# Patient Record
Sex: Male | Born: 1935 | Race: White | Hispanic: No | Smoking: Former smoker
Health system: Southern US, Community
[De-identification: ages and names within clinical notes are randomized; demographics above are authoritative.]

## PROBLEM LIST (undated history)

## (undated) DIAGNOSIS — R0602 Shortness of breath: Secondary | ICD-10-CM

## (undated) DIAGNOSIS — C61 Malignant neoplasm of prostate: Secondary | ICD-10-CM

## (undated) DIAGNOSIS — I639 Cerebral infarction, unspecified: Secondary | ICD-10-CM

## (undated) DIAGNOSIS — I10 Essential (primary) hypertension: Secondary | ICD-10-CM

## (undated) DIAGNOSIS — M1990 Unspecified osteoarthritis, unspecified site: Secondary | ICD-10-CM

## (undated) DIAGNOSIS — J189 Pneumonia, unspecified organism: Secondary | ICD-10-CM

## (undated) DIAGNOSIS — K5790 Diverticulosis of intestine, part unspecified, without perforation or abscess without bleeding: Secondary | ICD-10-CM

## (undated) DIAGNOSIS — J449 Chronic obstructive pulmonary disease, unspecified: Secondary | ICD-10-CM

## (undated) DIAGNOSIS — Z8489 Family history of other specified conditions: Secondary | ICD-10-CM

## (undated) DIAGNOSIS — F419 Anxiety disorder, unspecified: Secondary | ICD-10-CM

## (undated) DIAGNOSIS — K219 Gastro-esophageal reflux disease without esophagitis: Secondary | ICD-10-CM

## (undated) DIAGNOSIS — F329 Major depressive disorder, single episode, unspecified: Secondary | ICD-10-CM

## (undated) DIAGNOSIS — F32A Depression, unspecified: Secondary | ICD-10-CM

## (undated) DIAGNOSIS — K922 Gastrointestinal hemorrhage, unspecified: Secondary | ICD-10-CM

## (undated) HISTORY — DX: Major depressive disorder, single episode, unspecified: F32.9

## (undated) HISTORY — PX: CAROTID STENT INSERTION: SHX5766

## (undated) HISTORY — DX: Anxiety disorder, unspecified: F41.9

## (undated) HISTORY — DX: Depression, unspecified: F32.A

## (undated) HISTORY — PX: TOTAL KNEE ARTHROPLASTY: SHX125

## (undated) HISTORY — DX: Gastro-esophageal reflux disease without esophagitis: K21.9

## (undated) HISTORY — DX: Cerebral infarction, unspecified: I63.9

## (undated) HISTORY — DX: Chronic obstructive pulmonary disease, unspecified: J44.9

## (undated) HISTORY — PX: PROSTATE SURGERY: SHX751

---

## 1999-01-09 ENCOUNTER — Ambulatory Visit (HOSPITAL_COMMUNITY): Admission: RE | Admit: 1999-01-09 | Discharge: 1999-01-09 | Payer: Self-pay | Admitting: *Deleted

## 1999-04-14 ENCOUNTER — Ambulatory Visit (HOSPITAL_COMMUNITY): Admission: RE | Admit: 1999-04-14 | Discharge: 1999-04-14 | Payer: Self-pay | Admitting: Urology

## 1999-04-14 ENCOUNTER — Encounter: Payer: Self-pay | Admitting: Urology

## 2000-06-30 ENCOUNTER — Emergency Department (HOSPITAL_COMMUNITY): Admission: EM | Admit: 2000-06-30 | Discharge: 2000-06-30 | Payer: Self-pay | Admitting: Emergency Medicine

## 2001-07-26 HISTORY — PX: SHOULDER SURGERY: SHX246

## 2002-04-09 ENCOUNTER — Encounter (INDEPENDENT_AMBULATORY_CARE_PROVIDER_SITE_OTHER): Payer: Self-pay

## 2002-04-09 ENCOUNTER — Ambulatory Visit (HOSPITAL_COMMUNITY): Admission: RE | Admit: 2002-04-09 | Discharge: 2002-04-09 | Payer: Self-pay | Admitting: Gastroenterology

## 2002-05-09 ENCOUNTER — Ambulatory Visit (HOSPITAL_BASED_OUTPATIENT_CLINIC_OR_DEPARTMENT_OTHER): Admission: RE | Admit: 2002-05-09 | Discharge: 2002-05-09 | Payer: Self-pay | Admitting: Orthopedic Surgery

## 2005-08-10 ENCOUNTER — Ambulatory Visit (HOSPITAL_COMMUNITY): Admission: RE | Admit: 2005-08-10 | Discharge: 2005-08-10 | Payer: Self-pay | Admitting: Family Medicine

## 2006-09-30 ENCOUNTER — Ambulatory Visit: Payer: Self-pay | Admitting: Critical Care Medicine

## 2006-12-14 ENCOUNTER — Ambulatory Visit: Payer: Self-pay | Admitting: Critical Care Medicine

## 2006-12-23 ENCOUNTER — Ambulatory Visit (HOSPITAL_COMMUNITY): Admission: RE | Admit: 2006-12-23 | Discharge: 2006-12-23 | Payer: Self-pay | Admitting: Family Medicine

## 2007-03-28 ENCOUNTER — Ambulatory Visit (HOSPITAL_COMMUNITY): Admission: RE | Admit: 2007-03-28 | Discharge: 2007-03-28 | Payer: Self-pay | Admitting: Family Medicine

## 2007-03-30 ENCOUNTER — Ambulatory Visit (HOSPITAL_COMMUNITY): Admission: RE | Admit: 2007-03-30 | Discharge: 2007-03-30 | Payer: Self-pay | Admitting: Physician Assistant

## 2007-03-30 ENCOUNTER — Emergency Department (HOSPITAL_COMMUNITY): Admission: EM | Admit: 2007-03-30 | Discharge: 2007-03-30 | Payer: Self-pay | Admitting: Emergency Medicine

## 2007-04-05 ENCOUNTER — Ambulatory Visit: Payer: Self-pay | Admitting: Critical Care Medicine

## 2007-04-05 DIAGNOSIS — J439 Emphysema, unspecified: Secondary | ICD-10-CM

## 2007-04-05 DIAGNOSIS — K219 Gastro-esophageal reflux disease without esophagitis: Secondary | ICD-10-CM

## 2007-04-05 DIAGNOSIS — J984 Other disorders of lung: Secondary | ICD-10-CM

## 2007-06-26 ENCOUNTER — Ambulatory Visit (HOSPITAL_COMMUNITY): Admission: RE | Admit: 2007-06-26 | Discharge: 2007-06-26 | Payer: Self-pay | Admitting: Physician Assistant

## 2007-06-26 ENCOUNTER — Encounter: Payer: Self-pay | Admitting: Critical Care Medicine

## 2007-06-29 ENCOUNTER — Telehealth: Payer: Self-pay | Admitting: Critical Care Medicine

## 2007-10-19 ENCOUNTER — Telehealth (INDEPENDENT_AMBULATORY_CARE_PROVIDER_SITE_OTHER): Payer: Self-pay | Admitting: *Deleted

## 2007-10-20 ENCOUNTER — Ambulatory Visit: Payer: Self-pay | Admitting: Critical Care Medicine

## 2007-12-29 ENCOUNTER — Encounter: Payer: Self-pay | Admitting: Critical Care Medicine

## 2008-01-10 ENCOUNTER — Telehealth (INDEPENDENT_AMBULATORY_CARE_PROVIDER_SITE_OTHER): Payer: Self-pay | Admitting: *Deleted

## 2008-01-12 ENCOUNTER — Ambulatory Visit: Payer: Self-pay | Admitting: Critical Care Medicine

## 2008-02-15 ENCOUNTER — Telehealth (INDEPENDENT_AMBULATORY_CARE_PROVIDER_SITE_OTHER): Payer: Self-pay | Admitting: *Deleted

## 2008-04-01 ENCOUNTER — Inpatient Hospital Stay (HOSPITAL_COMMUNITY): Admission: EM | Admit: 2008-04-01 | Discharge: 2008-04-02 | Payer: Self-pay | Admitting: Emergency Medicine

## 2008-04-01 ENCOUNTER — Ambulatory Visit: Payer: Self-pay | Admitting: Internal Medicine

## 2008-04-08 ENCOUNTER — Ambulatory Visit: Payer: Self-pay | Admitting: Critical Care Medicine

## 2008-04-08 ENCOUNTER — Encounter: Payer: Self-pay | Admitting: Critical Care Medicine

## 2008-05-16 ENCOUNTER — Ambulatory Visit: Payer: Self-pay | Admitting: Critical Care Medicine

## 2008-06-17 ENCOUNTER — Inpatient Hospital Stay (HOSPITAL_COMMUNITY): Admission: EM | Admit: 2008-06-17 | Discharge: 2008-06-22 | Payer: Self-pay | Admitting: Emergency Medicine

## 2008-06-17 ENCOUNTER — Ambulatory Visit: Payer: Self-pay | Admitting: Cardiology

## 2008-06-18 ENCOUNTER — Encounter (INDEPENDENT_AMBULATORY_CARE_PROVIDER_SITE_OTHER): Payer: Self-pay | Admitting: Internal Medicine

## 2008-06-18 ENCOUNTER — Ambulatory Visit: Payer: Self-pay | Admitting: Surgery

## 2008-06-19 ENCOUNTER — Encounter (INDEPENDENT_AMBULATORY_CARE_PROVIDER_SITE_OTHER): Payer: Self-pay | Admitting: Internal Medicine

## 2008-06-21 ENCOUNTER — Encounter: Payer: Self-pay | Admitting: *Deleted

## 2008-06-21 ENCOUNTER — Ambulatory Visit (HOSPITAL_COMMUNITY): Admission: RE | Admit: 2008-06-21 | Discharge: 2008-06-21 | Payer: Self-pay | Admitting: *Deleted

## 2008-07-01 ENCOUNTER — Encounter: Payer: Self-pay | Admitting: Interventional Radiology

## 2008-07-10 ENCOUNTER — Telehealth: Payer: Self-pay | Admitting: Critical Care Medicine

## 2008-08-02 ENCOUNTER — Telehealth (INDEPENDENT_AMBULATORY_CARE_PROVIDER_SITE_OTHER): Payer: Self-pay | Admitting: *Deleted

## 2008-08-22 ENCOUNTER — Telehealth (INDEPENDENT_AMBULATORY_CARE_PROVIDER_SITE_OTHER): Payer: Self-pay | Admitting: *Deleted

## 2008-08-27 ENCOUNTER — Inpatient Hospital Stay (HOSPITAL_COMMUNITY): Admission: RE | Admit: 2008-08-27 | Discharge: 2008-08-28 | Payer: Self-pay | Admitting: Interventional Radiology

## 2008-09-01 ENCOUNTER — Ambulatory Visit: Payer: Self-pay | Admitting: Cardiology

## 2008-09-01 ENCOUNTER — Ambulatory Visit: Payer: Self-pay | Admitting: Critical Care Medicine

## 2008-09-01 ENCOUNTER — Inpatient Hospital Stay (HOSPITAL_COMMUNITY): Admission: EM | Admit: 2008-09-01 | Discharge: 2008-09-05 | Payer: Self-pay | Admitting: Emergency Medicine

## 2008-09-04 ENCOUNTER — Encounter (INDEPENDENT_AMBULATORY_CARE_PROVIDER_SITE_OTHER): Payer: Self-pay | Admitting: Internal Medicine

## 2008-09-10 ENCOUNTER — Encounter: Payer: Self-pay | Admitting: Interventional Radiology

## 2008-09-10 ENCOUNTER — Telehealth (INDEPENDENT_AMBULATORY_CARE_PROVIDER_SITE_OTHER): Payer: Self-pay | Admitting: *Deleted

## 2008-09-27 ENCOUNTER — Encounter: Payer: Self-pay | Admitting: Critical Care Medicine

## 2008-10-01 ENCOUNTER — Ambulatory Visit: Payer: Self-pay | Admitting: Critical Care Medicine

## 2008-10-07 ENCOUNTER — Encounter: Payer: Self-pay | Admitting: Critical Care Medicine

## 2008-11-22 ENCOUNTER — Inpatient Hospital Stay (HOSPITAL_COMMUNITY): Admission: EM | Admit: 2008-11-22 | Discharge: 2008-11-25 | Payer: Self-pay | Admitting: Emergency Medicine

## 2008-12-03 ENCOUNTER — Ambulatory Visit: Payer: Self-pay | Admitting: Critical Care Medicine

## 2008-12-06 ENCOUNTER — Ambulatory Visit: Payer: Self-pay | Admitting: Cardiology

## 2008-12-16 ENCOUNTER — Ambulatory Visit (HOSPITAL_COMMUNITY): Admission: RE | Admit: 2008-12-16 | Discharge: 2008-12-16 | Payer: Self-pay | Admitting: Interventional Radiology

## 2008-12-31 ENCOUNTER — Encounter: Payer: Self-pay | Admitting: Critical Care Medicine

## 2009-01-29 ENCOUNTER — Ambulatory Visit: Payer: Self-pay | Admitting: Critical Care Medicine

## 2009-01-30 ENCOUNTER — Ambulatory Visit (HOSPITAL_COMMUNITY): Admission: RE | Admit: 2009-01-30 | Discharge: 2009-01-30 | Payer: Self-pay | Admitting: Family Medicine

## 2009-02-18 ENCOUNTER — Ambulatory Visit (HOSPITAL_COMMUNITY): Admission: RE | Admit: 2009-02-18 | Discharge: 2009-02-18 | Payer: Self-pay | Admitting: Family Medicine

## 2009-03-12 ENCOUNTER — Telehealth: Payer: Self-pay | Admitting: Critical Care Medicine

## 2009-04-29 ENCOUNTER — Ambulatory Visit: Payer: Self-pay | Admitting: Critical Care Medicine

## 2009-10-31 ENCOUNTER — Ambulatory Visit: Payer: Self-pay | Admitting: Critical Care Medicine

## 2009-11-18 ENCOUNTER — Emergency Department (HOSPITAL_COMMUNITY): Admission: EM | Admit: 2009-11-18 | Discharge: 2009-11-19 | Payer: Self-pay | Admitting: Emergency Medicine

## 2009-11-26 IMAGING — XA IR TRANSCATH EMBOLIZATION
1 series · 11 of 24 positions shown · non-contrast
Comparison: Angiogram of 06/21/2008, and MRI of the brain of
06/18/2008.

CLINICAL DATA: Headaches.  Ataxia.  Confusion.  Right internal
carotid artery intracranial aneurysm.

RIGHT COMMON CAROTID ARTERIOGRAM FOLLOWED BY ENDOVASCULAR STENT-
ASSISTED COILING OF RIGHT INTERNAL CAROTID ARTERY PAROPHTHALMIC
REGION ANEURYSM

[Series 1: run · 11 of 262 slices shown]
[im 12/262]
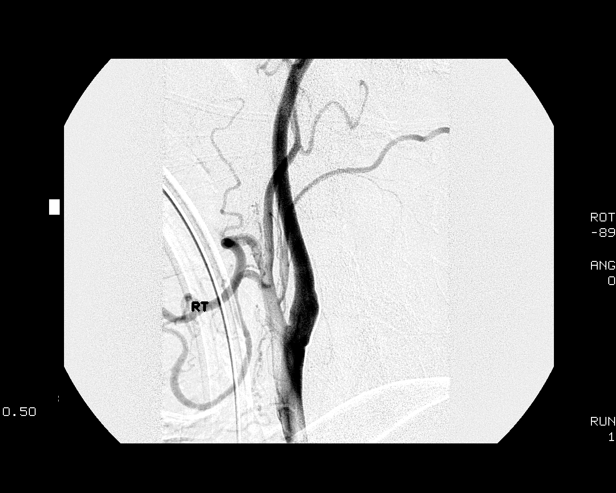
[im 35/262]
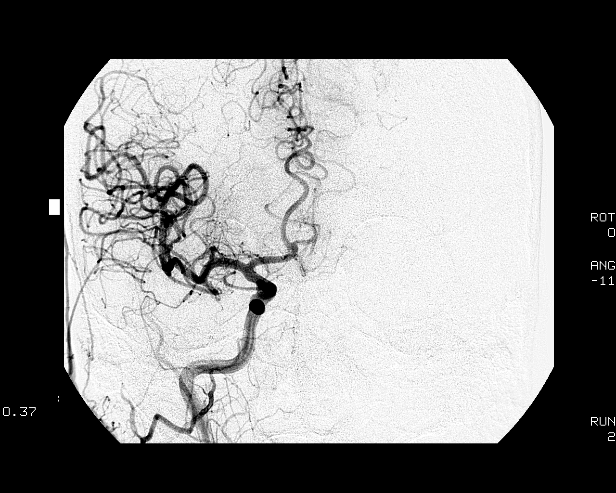
[im 57/262]
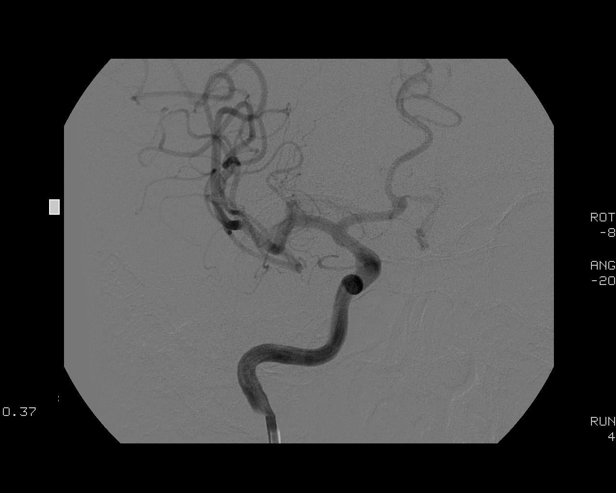
[im 80/262]
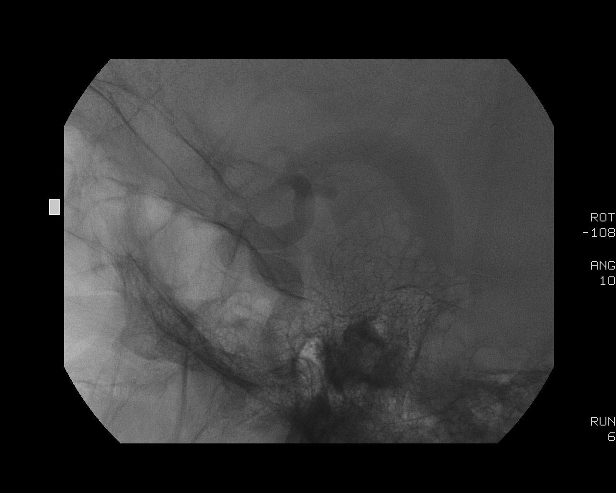
[im 103/262]
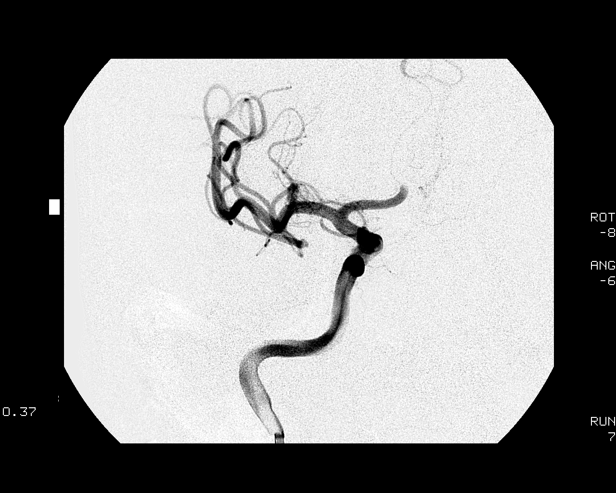
[im 137/262]
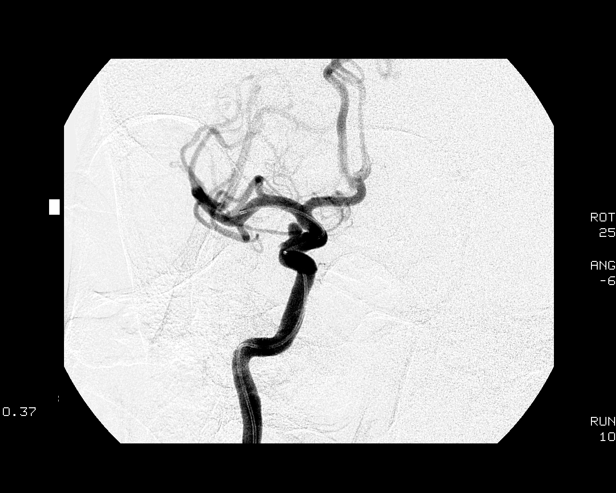
[im 159/262]
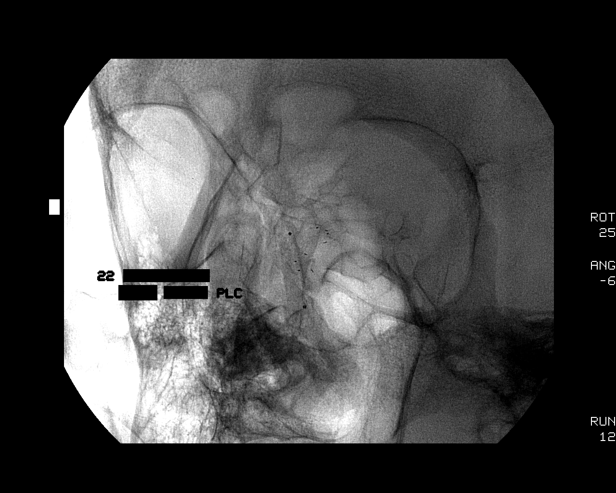
[im 182/262]
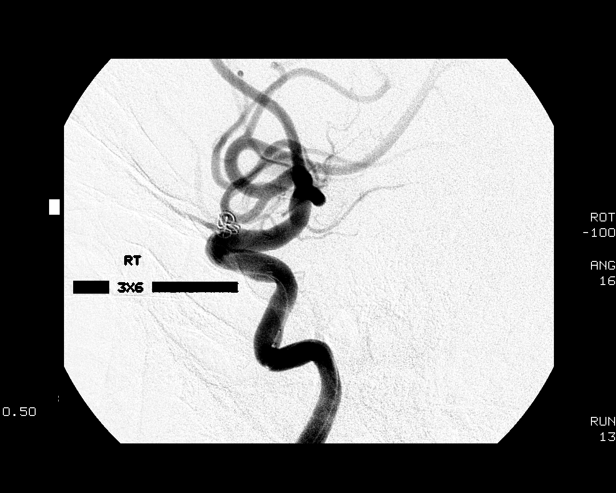
[im 205/262]
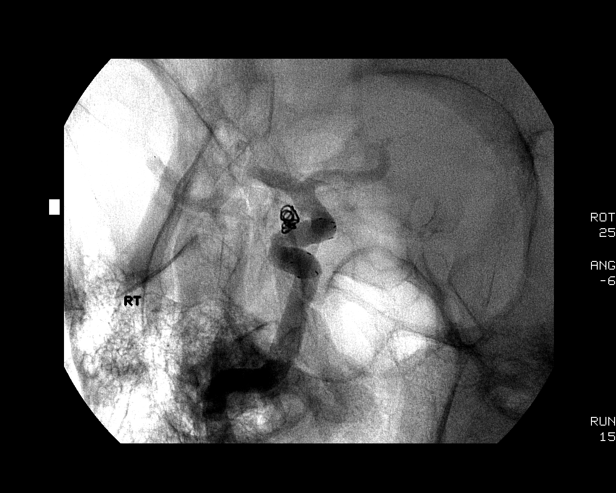
[im 227/262]
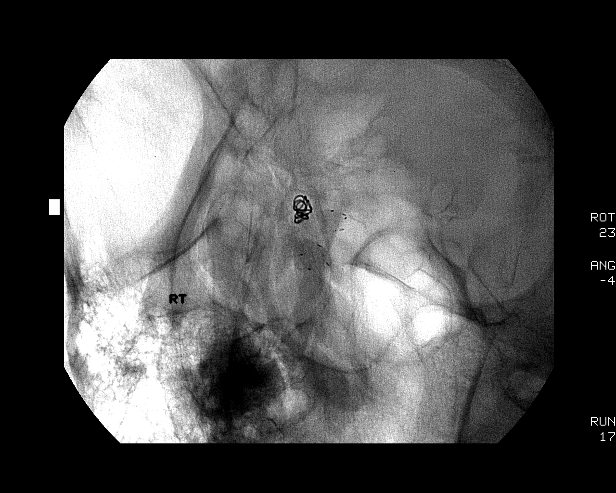
[im 250/262]
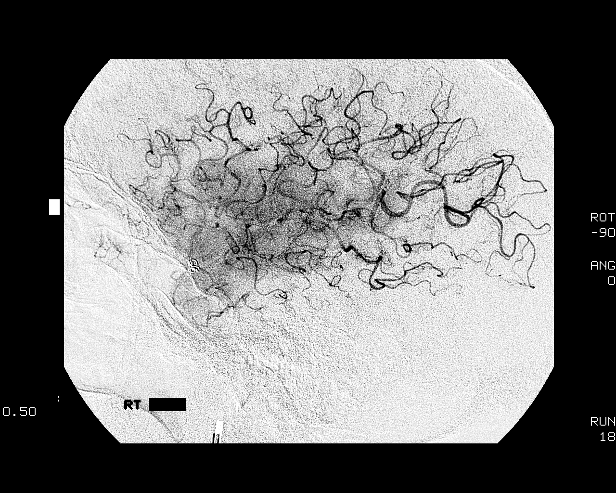

[11 of 24 positions shown; findings below may reference images not displayed]

Following a full explanation of the procedure along with potential
associated complications, an informed witnessed consent was
obtained.  Discussions regarding treatment options of this aneurysm
had been reviewed with the patient and the patient's daughter in
the outpatient consultation.

The patient was put under general anesthesia by the [REDACTED] of [HOSPITAL].

The right groin was prepped and draped in the usual sterile
fashion.  Thereafter using a modified Seldinger technique,
transfemoral access into the right common femoral artery was
obtained without difficulty.  Over a 0.035-inch guidewire, a 5-
French Pinnacle sheath was inserted.  Through this and also over a
0.035-inch guidewire, a 5-French JB1 catheter was advanced to the
aortic arch region and selectively positioned in the right common
carotid artery.  An arteriogram was then perfor[REDACTED]ed over
the right common carotid bifurcation and also intracranially.

The right common carotid bifurcation demonstrated the right
external carotid artery and its major branches to be normal.

The right internal carotid at the bulb showed a minimal plaque.
The vessel was seen to opacify normally to the cranial skull base.

The petrous the cavernous and supraclinoid segments of the right
internal carotid artery were seen to opacify normally.  The right
middle and the right anterior cerebral arteries were also seen to
opacify normally into capillary and venous phases.  Cross
opacification via the anterior communicating artery of the left
anterior cerebral artery distal to the A2 and also the left A1
segment was noted.

Again noted was the wide neck parophthalmic region aneurysm.  A 3-D
rotational arteriogram perfor[REDACTED]ed over the parophthalmic
region aneurysm demonstrated the wide neck nature of the aneurysm.
Also the ophthalmic artery was seen to be separate from the
aneurysm neck.  The aneurysm measured approximately 4.1 mm x
mm.

ENDOVASCULAR STENT-ASSISTED COILING OF THE RIGHT PAROPHTHALMIC
REGION ANEURYSM

The diagnostic 5-French JB1 catheter in the right common carotid
artery was exchanged over a 0.035-inch 300 cm Rosen exchange
guidewire for a 6-French 65 cm neurovascular sheath using biplane
roadmap technique and constant fluoroscopic guidance.  Good
aspiration was obtained from the side port of the neurovascular
sheath.  A gentle contrast injection demonstrated no evidence of
spasm, dissections, or of intraluminal filling defects.  This was
then connected to continuous heparinized saline infusion.

Over the Rosen exchange guidewire, a 6-French 90 cm Brite Tip Envoy
guide catheter was then advanced and positioned just proximal to
the origin of the right internal carotid artery.

The guidewire was removed.  Again good aspiration was obtained from
the hub of the 6-French guide catheter.  A gentle contrast
injection demonstrated no evidence of spasm, dissections, or of
intraluminal filling defects.  Over a 0.035-inch Roadrunner
guidewire, using biplane roadmap technique and constant
fluoroscopic guidance, the 6-French guide catheter was then
advanced into distal right internal carotid artery.  The guidewire
was removed.  Good aspiration was seen from the hub of the 6-French
guide catheter.  A control arteriogram performed through the 6-
French guide catheter demonstrated no change in the extracranial or
the intracranial circulation.

At this time in a coaxial manner and with constant heparinized
saline infusion using roadmap technique and constant fluoroscopic
guidance, a Prowler Select Plus 5 cm two-marker microcatheter which
had been steam shaped was advanced over a 0.014-inch Softip
Transend EX microguidewire to the distal end of the guide catheter.
With the microguidewire leading with a J-tip configuration, the
combination was navigated with the microguidewire leading to the
supraclinoid right ICA.  The right middle cerebral artery was
entered with the microguidewire followed by the microcatheter.  The
tip of the microcatheter was positioned in the mid right M1
segment.  The guidewire was removed.  Good aspiration was obtained
from the hub of the microcatheter.  A control arteriogram performed
through the 6-French guide catheter demonstrated safe positioning
of the tip of the microcatheter.  This was then connected to
continuous heparinized saline infusion.

Through a separate access bore on the double Tuohy-Borst, an
accessory SL 10 two-marker microcatheter which had been steam-
shaped was advanced over a 0.014-inch Softip Transend EX
microguidewire to the distal end of the guide catheter. With the
microguidewire leading with a J-tip configuration to avoid
dissections or inducing spasm, in a coaxial manner and with
constant heparinized saline infusion using biplane roadmap
technique and constant fluoroscopic guidance the combination of the
SL10 microcatheter and the microguidewire were navigated into the
proximal cavernous segment of the right internal carotid artery.
Access into the aneurysm was then attempted with the
microguidewire, without achieving a stable positioning of the
microcatheter tip.  The microcatheter tip was therefore left just
proximal to the neck of the aneurysm.

The guidewire was removed.  Good aspiration was obtained from the
SL10 microcatheter.  A control arteriogram performed through a 6-
French guide catheter demonstrated safe positioning of the tip of
the SL10 microcatheter and also the distal microcatheter.

At this time in a coaxial manner and with constant heparinized
saline infusion, using biplane roadmap technique and constant
fluoroscopic guidance, a 4.5 mm x 22 mm Enterprise stent was
advanced after it had been purged with heparinized saline infusion.
With the stent pusher, the stent was advanced into the distal
Prowler 14 Select Plus 5 cm microcatheter.  The distal and proximal
markers on the stent were then positioned equidistant from the neck
of the aneurysm in the parophthalmic region.

The O-ring on the delivery microcatheter and also on the delivery
stent microguidewire was loosened.  With slight forward gentle
traction with the right hand on the stent pusher, with the left
hand the delivery microcatheter was unsheathed, deploying the
distal end of the stent.  Once there, the stent was maintained for
about 5-10 seconds prior to further unsheathing of the stent and
deploying the remainder of the stent.  Excellent position was
obtained distal and proximal to the neck of the aneurysm.  A
control arteriogram performed through the 6-French guide catheter
demonstrated excellent positioning of the distal and the proximal
markers on the stent.  Good apposition was noted.  Excellent
coverage of the wide neck of the aneurysm was noted.  The delivery
micro guidewire was then retrieved ensuring no movement of the
stent.  None was observed.

Using a 0.014-inch Softip Transend EX microguidewire with a sharp J
configuration, the SL10 microcatheter was then advanced into the
aneurysm and positioned in the middle of the fundus.  The
microguidewire was removed ensuring no forward movements of the
microcatheter tip or the stent.  None was observed.  Good
aspiration was obtained from the hub of the intra aneurysmal
microcatheter.

The first coil utilized was a 2.5 mm x 6.6 cm MicruSphere Cerecyte
coil.  This was advanced in a coaxial manner and with constant
heparinized saline infusion using roadmap technique and constant
fluoroscopic guidance into the distal end of the intra-aneurysmal
microcatheter tip.  The coil was then introduced into the aneurysm
without a stable basket configuration being unsheathed.  This coil
was then removed.  This was then followed by a 3 mm x 6 cm
HydroFrame 10 coil.  This was advanced as described above and
positioned with a nice configuration being obtained within the
aneurysm.  A control arteriogram performed through the 6-French
guide catheter demonstrated safe positioning of the coil loops for
detachment.  This coil was then detached.  A control arteriogram
performed demonstrated near complete obliteration of the aneurysm.
Attempts at a 2 mm x 3 cm HydroSoft helical coil were met with
resistance to advancement of the entire coil. This coil was
therefore retrieved.  A control arteriogram performed through the 6-
French guide catheter demonstrated near complete obliteration with
a small neck remnant noted.  The stent remained in stable position.
No further attempts were made to add additional coils.

The microcatheter was then gently retrieved from the coil mass in
the aneurysm without any movement of the initial basket or of the
stent.  The microcatheter was then retrieved and removed.  A final
control arteriogram performed through the 6-French guide catheter
in the right internal carotid artery demonstrated near complete
obliteration of the small parophthalmic region aneurysm with patent
stent configuration.

No intraluminal filling defects were noted intracranially.

No acute changes were noted in the patient's blood pressure or
neurological status.  The patient's ACT was maintained in the
region of approximately 275-310 seconds.

The 6-French guide catheter and the neurovascular sheath were then
retrieved into the abdominal aorta and exchanged over a J-tip
guidewire for a 7-French Pinnacle sheath.  This was then connected
to continuous heparinized saline infusion.

The patient's general anesthesia was reversed and the patient was
extubated without difficulty.  Upon recovery, the patient's
demonstrated no new neurological signs or symptoms.  The patient
was then transported to the Neuro ICU for overnight IV treatment
with heparin, and also for close monitoring of the patient's blood
pressure and neurological status.

IMPRESSION
1.  Status post endovascular stent-assisted treatment of wide neck
right internal carotid artery intracranial periophthalmic region
aneurysm as described.
2.  The patient's overnight stay was unremarkable.  The patient's
IV heparin was stopped the following day and the right groin sheath
was removed.  Good hemostasis was achieved at the groin site.  The
patient was started on aspirin and Plavix.  Six hours post sheath-
pull the patient was ambulatory and eating independently.  The
patient was then discharged under the care of his daughter with
specific instructions.  The patient will return to the clinic in 2
weeks.

## 2010-01-15 ENCOUNTER — Ambulatory Visit (HOSPITAL_COMMUNITY): Admission: RE | Admit: 2010-01-15 | Discharge: 2010-01-15 | Payer: Self-pay | Admitting: Interventional Radiology

## 2010-03-11 ENCOUNTER — Ambulatory Visit: Payer: Self-pay | Admitting: Critical Care Medicine

## 2010-05-01 IMAGING — US US EXTREM LOW VENOUS*L*
1 series · 14 of 24 positions shown · non-contrast
Comparison: None

CLINICAL DATA: Left leg swelling.



[Series 1: unknown · 14 of 35 slices shown]
[im 1/35]
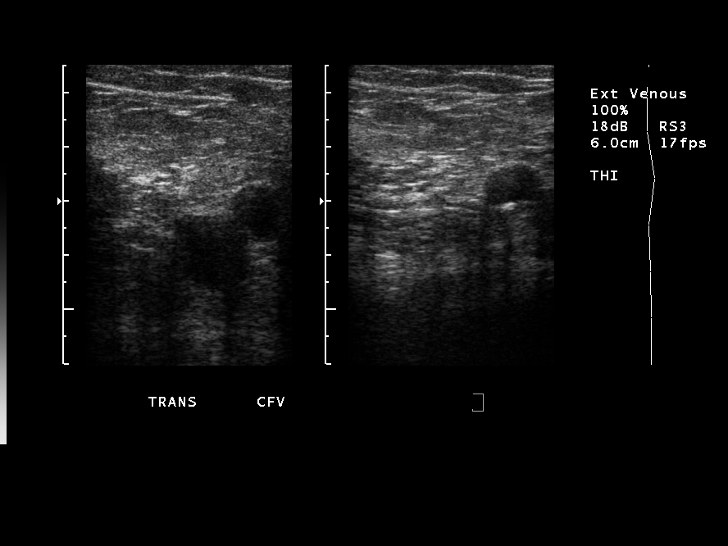
[im 3/35]
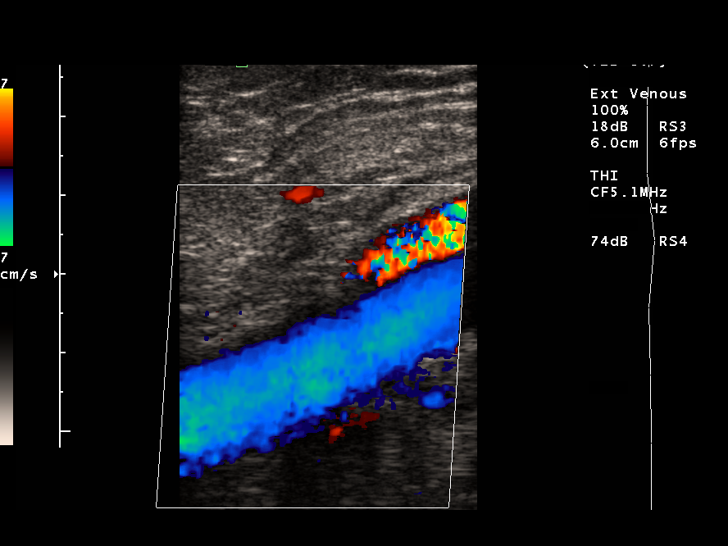
[im 6/35]
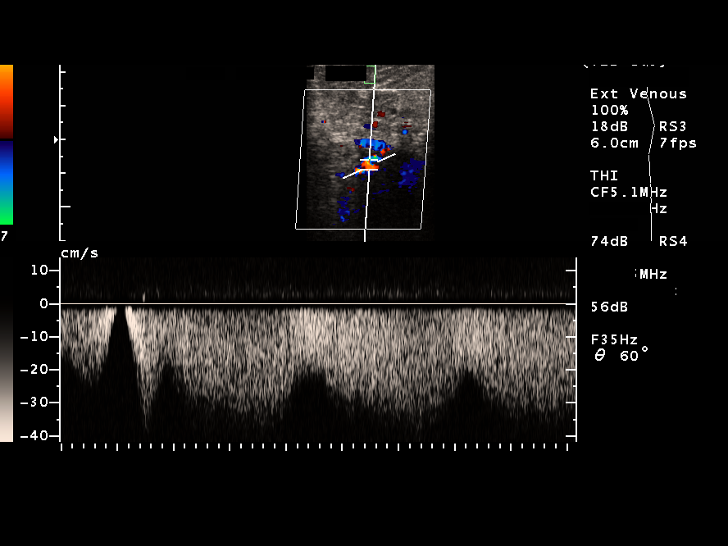
[im 9/35]
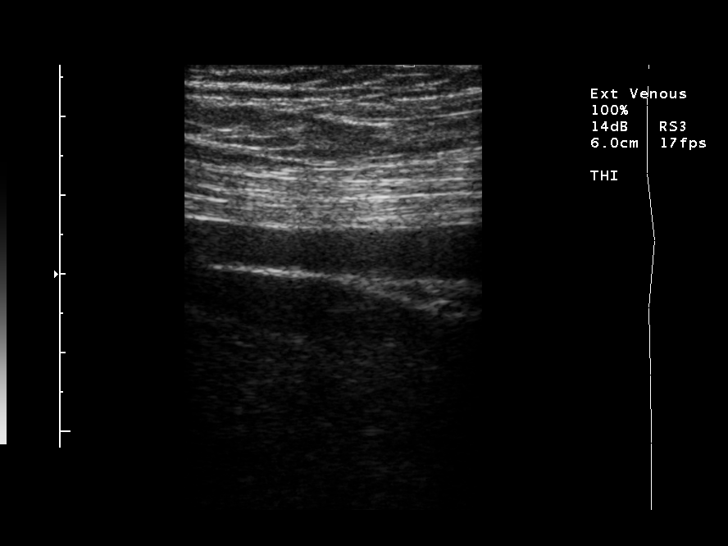
[im 11/35]
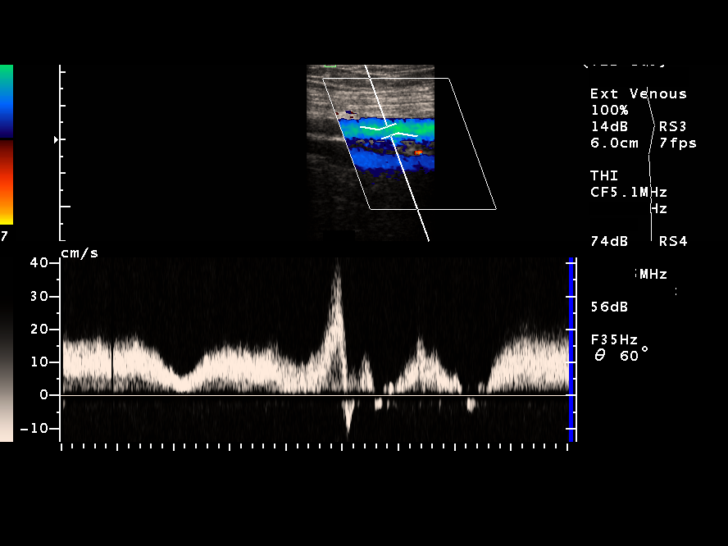
[im 14/35]
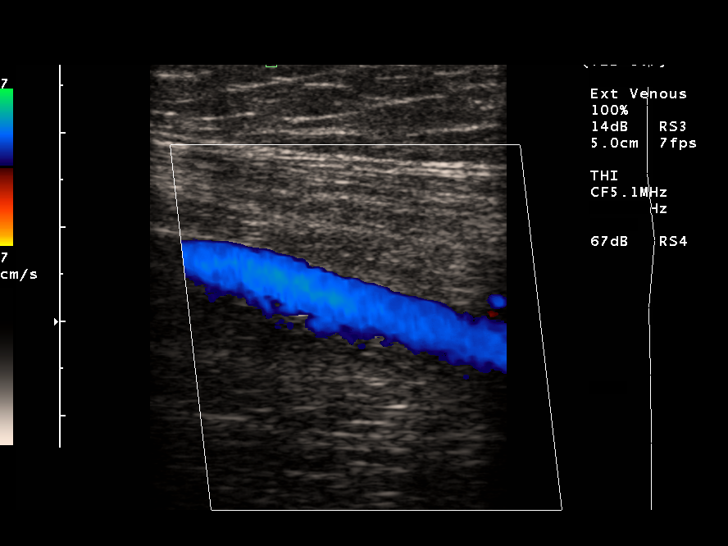
[im 17/35]
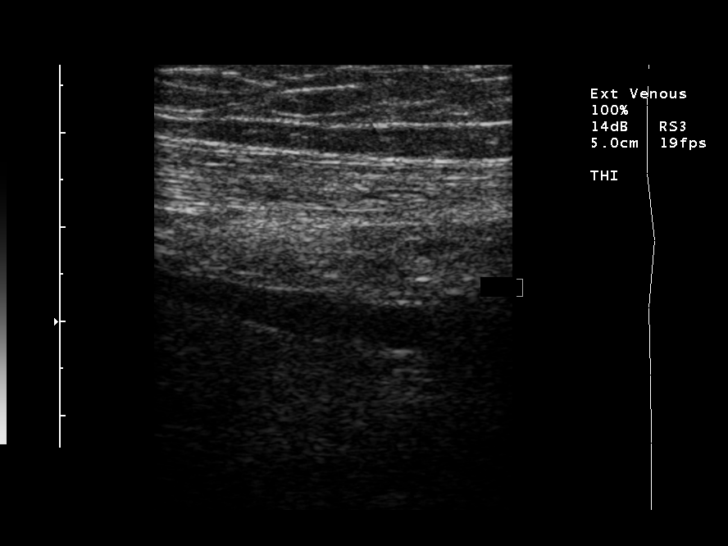
[im 18/35]
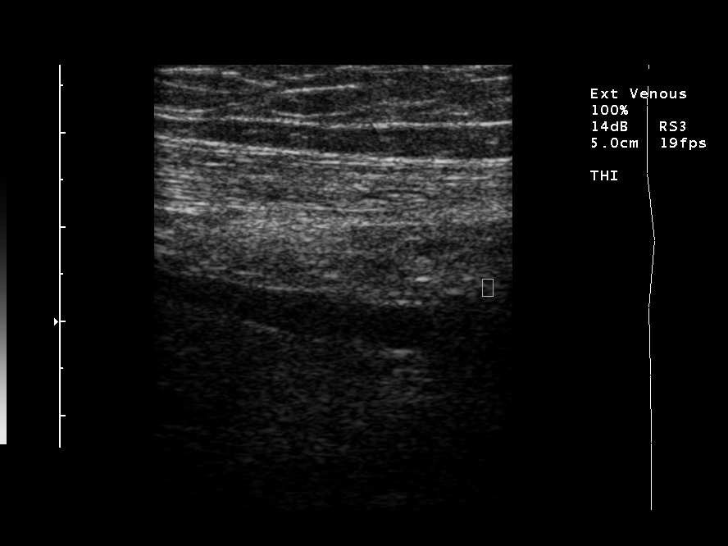
[im 21/35]
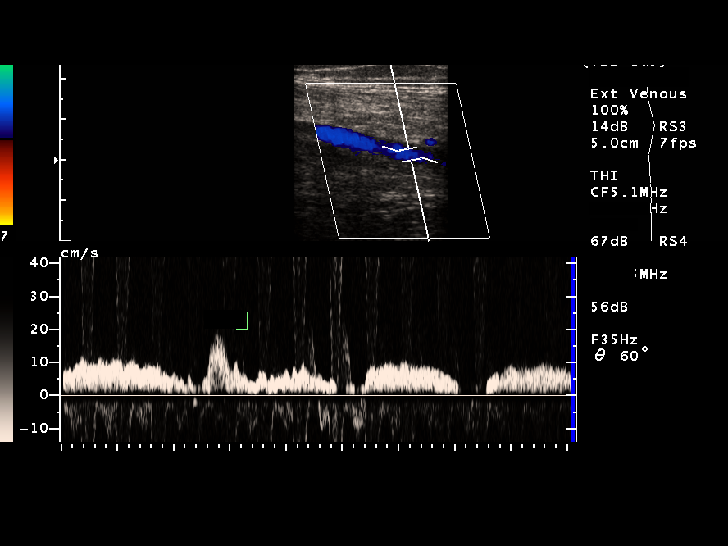
[im 24/35]
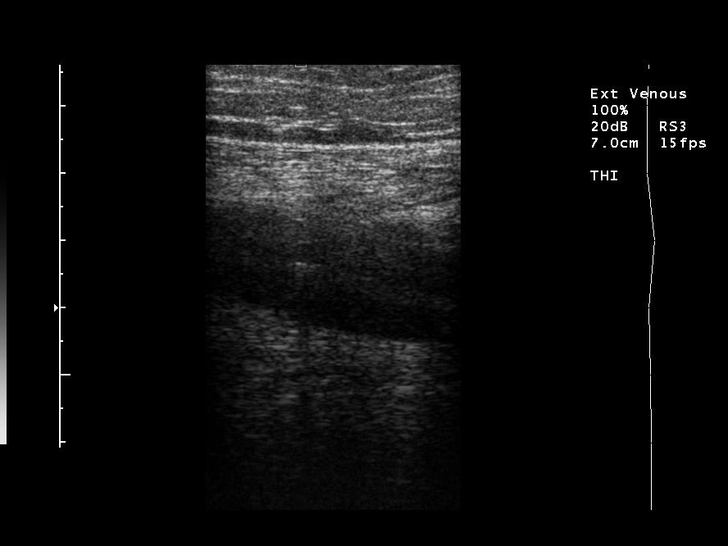
[im 27/35]
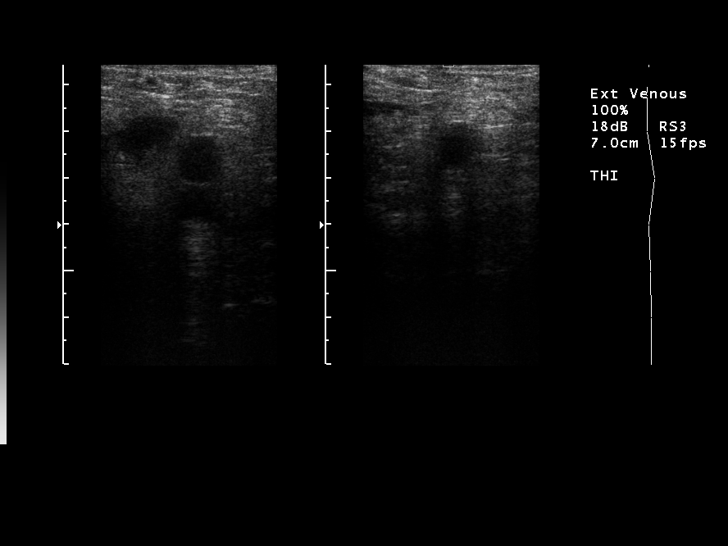
[im 29/35]
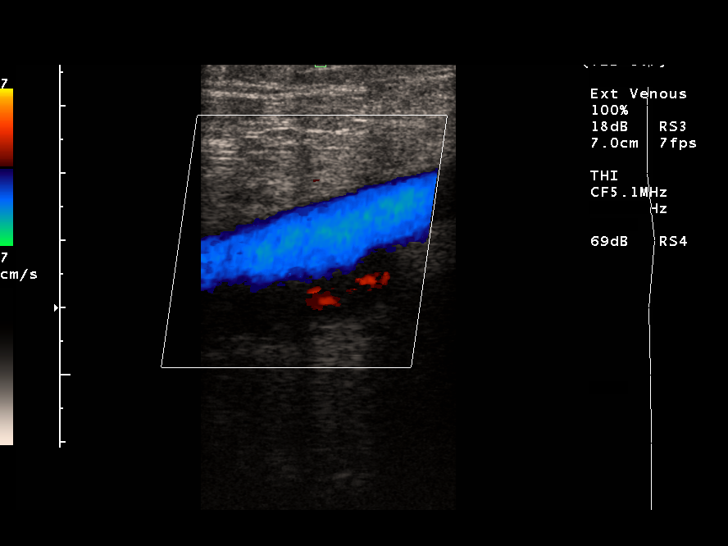
[im 32/35]
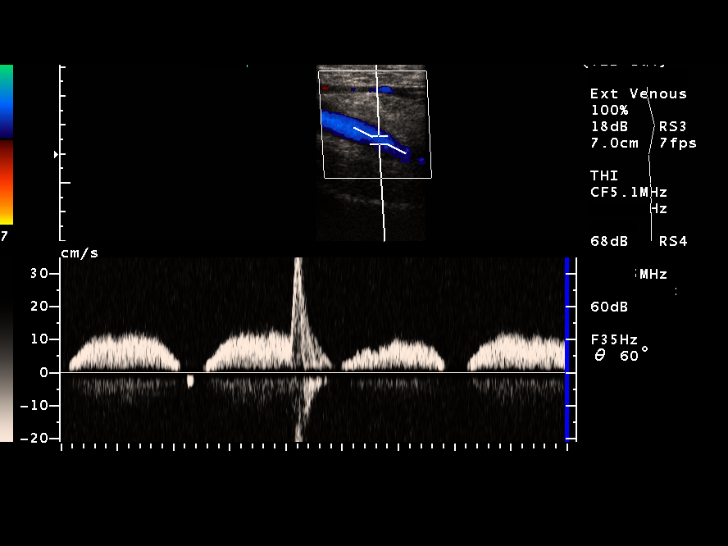
[im 35/35]
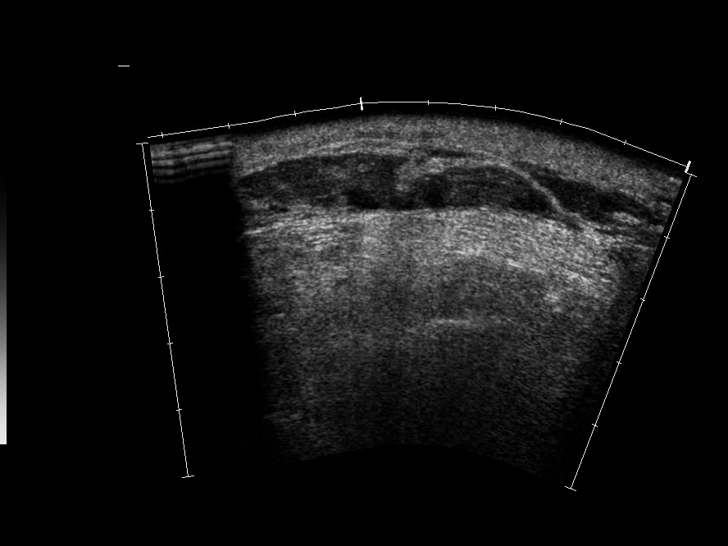

[14 of 24 positions shown; findings below may reference images not displayed]

FINDINGS: From the level of the left common femoral vein to the
left popliteal vein, there is adequate color flow, compression and
augmentation.  Adequate color flow proximal left calf veins.  Edema
left lower calf.
IMPRESSION: No evidence of left lower extremity deep venous thrombosis.

Edema subcutaneous region left lower calf.

## 2010-07-02 ENCOUNTER — Ambulatory Visit: Payer: Self-pay | Admitting: Critical Care Medicine

## 2010-07-26 DIAGNOSIS — I639 Cerebral infarction, unspecified: Secondary | ICD-10-CM

## 2010-07-26 HISTORY — DX: Cerebral infarction, unspecified: I63.9

## 2010-08-16 ENCOUNTER — Encounter: Payer: Self-pay | Admitting: Interventional Radiology

## 2010-08-16 ENCOUNTER — Encounter: Payer: Self-pay | Admitting: Family Medicine

## 2010-08-17 ENCOUNTER — Encounter: Payer: Self-pay | Admitting: Family Medicine

## 2010-08-17 ENCOUNTER — Encounter: Payer: Self-pay | Admitting: Interventional Radiology

## 2010-08-25 NOTE — Assessment & Plan Note (Signed)
Summary: Pulmonary OV   Primary Provider/Referring Provider:  Dr. Rudi Heap in [place]  CC:  4 month follow up.  Pt states overall breathing is unchanged from last OV.  He does states the humidity does make it harder to breath.  Has a prod cough with light green mucus and some wheezing mainly at night.  Denies tightness in chest.  Pt states he stopped taking the advair and restarted symbicort approx 1 month ago.  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  In addition, there are chronic granulomatous changes in both apical sections of both lungs.  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  Cultures have not been obtained.  This is an empiric  diagnosis.   Follow-up CT scan was obtained in December 2008 and when compared to September 2008 many of the areas had regressed.     October 31, 2009 2:10 PM The pt is doing well unless exerts self.  walkd downtown some,  Not much mucous.  Occ spells of mucous is noted.  No chest pain.  No f/c/s. Pt denies any significant sore throat, nasal congestion or excess secretions, fever, chills, sweats, unintended weight loss, pleurtic or exertional chest pain, orthopnea PND, or leg swelling Pt denies any increase in rescue therapy over baseline, denies waking up needing it or having any early am or nocturnal exacerbations of coughing/wheezing/or dyspnea.   March 11, 2010 2:16 PM copd f/u. The pt notes  since april about the same.  The pt notes some cough,  mucus is pale green.   No nasal congestion.  Dyspnea is the same.   Pt denies any significant sore throat, nasal congestion or excess secretions, fever, chills, sweats, unintended weight loss, pleurtic or exertional chest pain, orthopnea PND, or leg swelling Pt denies any increase  in rescue therapy over baseline, denies waking up needing it or having any early am or nocturnal exacerbations of coughing/wheezing/or dyspnea. The pt has switched back to symbicort from advair, but is only taking one puff two times a day of 160stn.    Preventive Screening-Counseling & Management  Alcohol-Tobacco     Smoking Status: quit > 6 months     Year Quit: 2004     Pack years: 57  Current Medications (verified): 1)  Venlafaxine Hcl 150 Mg Xr24h-Cap (Venlafaxine Hcl) .... Two Times A Day 2)  Symbicort 160-4.5 Mcg/act Aero (Budesonide-Formoterol Fumarate) .... Inhale 1 Puff Two Times A Day 3)  Prilosec 20 Mg  Cpdr (Omeprazole) .Marland Kitchen.. 1 By Mouth Daily 4)  Mirtazapine 30 Mg Tabs (Mirtazapine) .... At Bedtime 5)  Aspirin 81 Mg Tabs (Aspirin) .Marland Kitchen.. 1 By Mouth Daily 6)  Zoloft 100 Mg Tabs (Sertraline Hcl) .... Once Daily 7)  Stool Softner .... Once Daily 8)  Vitamin D3 10000 Unit Caps (Cholecalciferol) .... Take 5 Weekly 9)  Iron 325 (65 Fe) Mg Tabs (Ferrous Sulfate) .... Once Daily 10)  Ventolin Hfa 108 (90 Base) Mcg/act  Aers (Albuterol Sulfate) .Marland Kitchen.. 1-2 Puffs Every 4-6 Hours As Needed 11)  Alprazolam 0.5 Mg Tabs (Alprazolam) .... As Needed  Allergies (verified): No Known Drug Allergies  Past History:  Past medical, surgical, family and social histories (including risk factors) reviewed, and no changes noted (except as noted below).  Past Medical History: Reviewed history  from 04/08/2008 and no changes required. COPD GERD  Past Surgical History: Reviewed history from 01/29/2009 and no changes required. R TKR   Family History: Reviewed history from 10/20/2007 and no changes required. non contrib Family History COPD brother died recently 12-25-07  Social History: Reviewed history from 10/20/2007 and no changes required. Patient states former smoker.  retired  Review of Systems       The patient complains of shortness of breath with activity and productive cough.  The  patient denies shortness of breath at rest, non-productive cough, coughing up blood, chest pain, irregular heartbeats, acid heartburn, indigestion, loss of appetite, weight change, abdominal pain, difficulty swallowing, sore throat, tooth/dental problems, headaches, nasal congestion/difficulty breathing through nose, sneezing, itching, ear ache, anxiety, depression, hand/feet swelling, joint stiffness or pain, rash, change in color of mucus, and fever.    Vital Signs:  Patient profile:   77 year old male Height:      73 inches Weight:      203 pounds BMI:     26.88 O2 Sat:      92 % on Room air Temp:     98.5 degrees F oral Pulse rate:   94 / minute BP sitting:   152 / 86  (left arm) Cuff size:   regular  Vitals Entered By: Gweneth Dimitri RN (March 11, 2010 2:02 PM)  O2 Flow:  Room air CC: 4 month follow up.  Pt states overall breathing is unchanged from last OV.  He does states the humidity does make it harder to breath.  Has a prod cough with light green mucus and some wheezing mainly at night.  Denies tightness in chest.  Pt states he stopped taking the advair and restarted symbicort approx 1 month ago.  Symbicort seems to be helping better than the advair.  Does patient need assistance? Functional Status Social activities Comments Medications reviewed with patient Daytime contact number verified with patient. Gweneth Dimitri RN  March 11, 2010 2:02 PM    Physical Exam  Additional Exam:  Gen: Pleasant, well nourished, in no distress ENT: no lesions, no postnasal drip Neck: No JVD, no TMG, no carotid bruits Lungs: no use of accessory muscles, no dullness to percussion, distant BS,  Cardiovascular: RRR, heart sounds normal, no murmurs or gallops, no peripheral edema Musculoskeletal: No deformities, no cyanosis or clubbing     Impression & Recommendations:  Problem # 1:  COPD (ICD-496) Assessment Unchanged  stable copd with AB and emphysema component plan increase  symbicort to two puff two times a day  pt demonstrated 100% efficacy of hfa technique   Medications Added to Medication List This Visit: 1)  Symbicort 160-4.5 Mcg/act Aero (Budesonide-formoterol fumarate) .... Inhale 1 puff two times a day 2)  Symbicort 160-4.5 Mcg/act Aero (Budesonide-formoterol fumarate) .... Inhale 2  puff two times a day  Complete Medication List: 1)  Venlafaxine Hcl 150 Mg Xr24h-cap (Venlafaxine hcl) .... Two times a day 2)  Symbicort 160-4.5 Mcg/act Aero (Budesonide-formoterol fumarate) .... Inhale 2  puff two times a day 3)  Prilosec 20 Mg Cpdr (Omeprazole) .Marland Kitchen.. 1 by mouth daily 4)  Mirtazapine 30 Mg Tabs (Mirtazapine) .... At bedtime 5)  Aspirin 81 Mg Tabs (Aspirin) .Marland Kitchen.. 1 by mouth daily 6)  Zoloft 100 Mg Tabs (Sertraline hcl) .... Once daily 7)  Stool Softner  .... Once daily 8)  Vitamin D3 10000 Unit Caps (Cholecalciferol) .... Take 5 weekly 9)  Iron 325 (65 Fe) Mg Tabs (Ferrous sulfate) .... Once  daily 10)  Ventolin Hfa 108 (90 Base) Mcg/act Aers (Albuterol sulfate) .Marland Kitchen.. 1-2 puffs every 4-6 hours as needed 11)  Alprazolam 0.5 Mg Tabs (Alprazolam) .... As needed  Other Orders: Est. Patient Level III (16109)  Patient Instructions: 1)  Increase symbicort to two puff twice daily 2)  Return 4 months or sooner if necessary  Appended Document: Pulmonary OV fax don moore

## 2010-08-25 NOTE — Assessment & Plan Note (Signed)
Summary: Pulmonary OV   Primary Provider/Referring Provider:  Dr. Rudi Heap in [place]  CC:  6 month COPD follow up.  States breathing is doing "good" overall.  States he does have SOB with activity and wheezing at night.  States he does have "a little cough and " -prod with clear mucus.  Denies chest tightness.  Requesting rx for ventolin and advair-62month and print for [facility]..  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  In addition, there are chronic granulomatous changes in both apical sections of both lungs.  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  Cultures have not been obtained.  This is an empiric  diagnosis.   Follow-up CT scan was obtained in December 2008 and when compared to September 2008 many of the areas had regressed.        October 01, 2008 11:58 AM no changes at last ov Pt was in hosp in [place] and now is better.  Had PNA again.  Now no cough. Pt denies any significant sore throat, nasal congestion or excess secretions, fever, chills, sweats, unintended weight loss, pleurtic or exertional chest pain, orthopnea PND, or leg swelling Pt denies any increase in rescue therapy over baseline, denies waking up needing it or having any early am or nocturnal exacerbations of coughing/wheezing/or dyspnea. Dec 03, 2008 3:59 PM No change in medications at last ov. The pt was rehospitalized 4/30- 5/2 for RLL PNA.  Now improved after 7 days of avelox.  Had prior episodes PNA fall 09 and winter 10. PFTS today reviewed show stable obstructive lung disease. Pt ?s use of advair with recurrent pna. Pt denies any significant sore throat, nasal congestion or excess secretions, fever, chills, sweats, unintended weight loss, pleurtic or exertional chest pain, orthopnea PND, or leg swelling  January 29, 2009  10:20 AM Now noting more dyspnea with humidity.  Noting more cough and mucous is clear to white.  No chest pain is noted.  No wheezing.  Noting some pain in LLE.  Notes more wheezing. Denies fever. Pt denies any significant sore throat, nasal congestion or, fever, chills, sweats, unintended weight loss, pleurtic or exertional chest pain, orthopnea PND, or leg swelling  April 29, 2009 12:14 PM Doing ok,  not much cough now,  no wheeze,  some mucous and is clear.  No chest pain.  No wheezing.  No fever.   spiriva did not help , likes advair better Pt denies any significant sore throat, nasal congestion or excess secretions, fever, chills, sweats, unintended weight loss, pleurtic or exertional chest pain, orthopnea PND, or leg swelling October 31, 2009 2:10 PM The pt is doing well unless exerts self.  walkd downtown some,  Not much mucous.  Occ spells of mucous is noted.  No chest pain.  No f/c/s. Pt denies any significant sore throat, nasal congestion or excess secretions, fever, chills, sweats, unintended weight loss, pleurtic or exertional chest pain, orthopnea PND, or leg swelling Pt denies any increase in rescue therapy over baseline, denies waking up needing it or having any early am or nocturnal exacerbations of coughing/wheezing/or dyspnea.   Preventive Screening-Counseling & Management  Alcohol-Tobacco     Smoking Status: quit > 6 months  Current Medications (verified): 1)  Venlafaxine Hcl 150 Mg Xr24h-Cap (Venlafaxine Hcl) .... Two Times A Day 2)  Advair Diskus 250-50 Mcg/dose Aepb (Fluticasone-Salmeterol) .... One Puff Two Times A Day 3)  Prilosec 20 Mg  Cpdr (Omeprazole) .Marland Kitchen.. 1 By Mouth Daily 4)  Mirtazapine 30 Mg Tabs (Mirtazapine) .... At Bedtime 5)  Aspirin 81 Mg Tabs (Aspirin) .Marland Kitchen.. 1 By Mouth Daily 6)  Zoloft 100 Mg Tabs (Sertraline Hcl) .... Once Daily 7)  Stool Softner .... Once Daily 8)  Vitamin D3 10000 Unit Caps (Cholecalciferol) .... Take 5 Weekly 9)  Iron 325 (65 Fe)  Mg Tabs (Ferrous Sulfate) .... Once Daily 10)  Ventolin Hfa 108 (90 Base) Mcg/act  Aers (Albuterol Sulfate) .Marland Kitchen.. 1-2 Puffs Every 4-6 Hours As Needed 11)  Alprazolam 0.5 Mg Tabs (Alprazolam) .... As Needed 12)  Abilify 2 Mg Tabs (Aripiprazole) .... Once Daily  Allergies (verified): No Known Drug Allergies  Past History:  Past medical, surgical, family and social histories (including risk factors) reviewed, and no changes noted (except as noted below).  Past Medical History: Reviewed history from 04/08/2008 and no changes required. COPD GERD  Past Surgical History: Reviewed history from 01/29/2009 and no changes required. R TKR   Family History: Reviewed history from 10/20/2007 and no changes required. non contrib Family History COPD brother died recently Dec 31, 2007  Social History: Reviewed history from 10/20/2007 and no changes required. Patient states former smoker.  retired  Review of Systems       The patient complains of shortness of breath with activity.  The patient denies shortness of breath at rest, productive cough, non-productive cough, coughing up blood, chest pain, irregular heartbeats, acid heartburn, indigestion, loss of appetite, weight change, abdominal pain, difficulty swallowing, sore throat, tooth/dental problems, headaches, nasal congestion/difficulty breathing through nose, sneezing, itching, ear ache, anxiety, depression, hand/feet swelling, joint stiffness or pain, rash, change in color of mucus, and fever.    Vital Signs:  Patient profile:   77 year old male Height:      73 inches Weight:      200.25 pounds BMI:     26.52 O2 Sat:      93 % on Room air Temp:     98.0 degrees F oral Pulse rate:   91 / minute BP sitting:   124 / 82  (right arm) Cuff size:   regular  Vitals Entered By: Gweneth Dimitri RN (October 31, 2009 1:59 PM)  O2 Flow:  Room air CC: 6 month COPD follow up.  States breathing is doing "good" overall.  States he does have SOB with  activity and wheezing at night.  States he does have "a little cough," -prod with clear mucus.  Denies chest tightness.  Requesting rx for ventolin and advair-85month and print for [facility]. Comments Medications reviewed with patient Daytime contact number verified with patient. Gweneth Dimitri RN  October 31, 2009 1:59 PM    Physical Exam  Additional Exam:  Gen: Pleasant, well nourished, in no distress ENT: no lesions, no postnasal drip Neck: No JVD, no TMG, no carotid bruits Lungs: no use of accessory muscles, no dullness to percussion, distant BS,  Cardiovascular: RRR, heart sounds normal, no murmurs or gallops, no peripheral edema Musculoskeletal: No deformities, no cyanosis or clubbing     Impression & Recommendations:  Problem # 1:  COPD (ICD-496) Assessment Unchanged  stable copd with AB and emphysema component plan No change in inhaled medications.   Maintain treatment program as currently prescribed.  Medications Added to Medication List This Visit: 1)  Venlafaxine Hcl 150 Mg Xr24h-cap (Venlafaxine hcl) .Marland KitchenMarland KitchenMarland Kitchen  Two times a day 2)  Mirtazapine 30 Mg Tabs (Mirtazapine) .... At bedtime 3)  Iron 325 (65 Fe) Mg Tabs (Ferrous sulfate) .... Once daily 4)  Alprazolam 0.5 Mg Tabs (Alprazolam) .... As needed 5)  Abilify 2 Mg Tabs (Aripiprazole) .... Once daily  Complete Medication List: 1)  Venlafaxine Hcl 150 Mg Xr24h-cap (Venlafaxine hcl) .... Two times a day 2)  Advair Diskus 250-50 Mcg/dose Aepb (Fluticasone-salmeterol) .... One puff two times a day 3)  Prilosec 20 Mg Cpdr (Omeprazole) .Marland Kitchen.. 1 by mouth daily 4)  Mirtazapine 30 Mg Tabs (Mirtazapine) .... At bedtime 5)  Aspirin 81 Mg Tabs (Aspirin) .Marland Kitchen.. 1 by mouth daily 6)  Zoloft 100 Mg Tabs (Sertraline hcl) .... Once daily 7)  Stool Softner  .... Once daily 8)  Vitamin D3 10000 Unit Caps (Cholecalciferol) .... Take 5 weekly 9)  Iron 325 (65 Fe) Mg Tabs (Ferrous sulfate) .... Once daily 10)  Ventolin Hfa 108 (90  Base) Mcg/act Aers (Albuterol sulfate) .Marland Kitchen.. 1-2 puffs every 4-6 hours as needed 11)  Alprazolam 0.5 Mg Tabs (Alprazolam) .... As needed 12)  Abilify 2 Mg Tabs (Aripiprazole) .... Once daily  Other Orders: Est. Patient Level III (84132)  Patient Instructions: 1)  No change in medications 2)  Retuirn 4 months  Prescriptions: VENTOLIN HFA 108 (90 BASE) MCG/ACT  AERS (ALBUTEROL SULFATE) 1-2 puffs every 4-6 hours as needed  #2 x 4   Entered and Authorized by:   Storm Frisk MD   Signed by:   Storm Frisk MD on 10/31/2009   Method used:   Faxed to ...       [facility]* (mail-order)       [address]       [place], [place]  [address]       Ph: [phone]       Fax: [phone]   RxID:   [id number] ADVAIR DISKUS 250-50 MCG/DOSE AEPB (FLUTICASONE-SALMETEROL) one puff two times a day  #3 x 4   Entered and Authorized by:   Storm Frisk MD   Signed by:   Storm Frisk MD on 10/31/2009   Method used:   Faxed to ...       [facility]* (mail-order)       [address]       [place], [place]  [address]       Ph: [phone]       Fax: [phone]   RxID:   [id number]   Appended Document: Pulmonary OV fax [place] [facility]

## 2010-09-01 ENCOUNTER — Ambulatory Visit (INDEPENDENT_AMBULATORY_CARE_PROVIDER_SITE_OTHER): Payer: MEDICARE | Admitting: Critical Care Medicine

## 2010-09-01 ENCOUNTER — Encounter: Payer: Self-pay | Admitting: Critical Care Medicine

## 2010-09-01 DIAGNOSIS — F329 Major depressive disorder, single episode, unspecified: Secondary | ICD-10-CM | POA: Insufficient documentation

## 2010-09-01 DIAGNOSIS — J449 Chronic obstructive pulmonary disease, unspecified: Secondary | ICD-10-CM

## 2010-09-01 DIAGNOSIS — M109 Gout, unspecified: Secondary | ICD-10-CM | POA: Insufficient documentation

## 2010-09-10 NOTE — Assessment & Plan Note (Addendum)
Summary: Pulmonary OV   Primary Provider/Referring Provider:  Dr. Rudi Heap in [place]  CC:  6 month follow up.  States states breathing is slightly worse when doing activities -onset x 2 months.  Wheezing at times.   Occas cough with a small amount of white mucus.  .  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  In addition, there are chronic granulomatous changes in both apical sections of both lungs.  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  Cultures have not been obtained.  This is an empiric  diagnosis.   Follow-up CT scan was obtained in December 2008 and when compared to September 2008 many of the areas had regressed.     October 31, 2009 2:10 PM The pt is doing well unless exerts self.  walkd downtown some,  Not much mucous.  Occ spells of mucous is noted.  No chest pain.  No f/c/s. Pt denies any significant sore throat, nasal congestion or excess secretions, fever, chills, sweats, unintended weight loss, pleurtic or exertional chest pain, orthopnea PND, or leg swelling Pt denies any increase in rescue therapy over baseline, denies waking up needing it or having any early am or nocturnal exacerbations of coughing/wheezing/or dyspnea.   March 11, 2010 2:16 PM copd f/u. The pt notes  since april about the same.  The pt notes some cough,  mucus is pale green.   No nasal congestion.  Dyspnea is the same.   Pt denies any significant sore throat, nasal congestion or excess secretions, fever, chills, sweats, unintended weight loss, pleurtic or exertional chest pain, orthopnea PND, or leg swelling Pt denies any increase in rescue therapy over baseline, denies waking up needing it or having any early am or nocturnal exacerbations of coughing/wheezing/or dyspnea. The pt has switched back to symbicort from advair, but is  only taking one puff two times a day of 160stn.  September 01, 2010 4:39 PM Not in hosp since prior 8/11 OV.  Now not much cough.  Dyspnea sl worse.  If up incline is sl worse.  No real chest pain. Pt denies any significant sore throat, nasal congestion or excess secretions, fever, chills, sweats, unintended weight loss, pleurtic or exertional chest pain, orthopnea PND, or leg swelling Pt denies any increase in rescue therapy over baseline, denies waking up needing it or having any early am or nocturnal exacerbations of coughing/wheezing/or dyspnea.     Current Medications (verified): 1)  Venlafaxine Hcl 150 Mg Xr24h-Cap (Venlafaxine Hcl) .... Two Times A Day 2)  Symbicort 160-4.5 Mcg/act Aero (Budesonide-Formoterol Fumarate) .... Inhale 2  Puff Two Times A Day 3)  Prilosec 20 Mg  Cpdr (Omeprazole) .Marland Kitchen.. 1 By Mouth Daily 4)  Mirtazapine 30 Mg Tabs (Mirtazapine) .... At Bedtime 5)  Aspirin 81 Mg Tabs (Aspirin) .Marland Kitchen.. 1 By Mouth Daily 6)  Zoloft 100 Mg Tabs (Sertraline Hcl) .... Once Daily 7)  Stool Softener 100 Mg Caps (Docusate Sodium) .... Take 1 Capsule By Mouth Once A Day 8)  Vitamin D3 10000 Unit Caps (Cholecalciferol) .... Take 5 Weekly 9)  Ventolin Hfa 108 (90 Base) Mcg/act  Aers (Albuterol Sulfate) .Marland Kitchen.. 1-2 Puffs Every 4-6 Hours As Needed 10)  Alprazolam 0.5 Mg Tabs (Alprazolam) .... As Needed 11)  Abilify 2 Mg Tabs (Aripiprazole) .... Take 1 Tablet By Mouth Once A Day  Allergies (verified): No Known Drug Allergies  Past History:  Past medical, surgical, family and social histories (including risk factors) reviewed, and no changes noted (except as noted below).  Past Medical History: COPD GERD Anxiety/depression    -Abilify started 2/12  Dr Jeannine Kitten. [facility] [place] [facility]    Past Surgical History: Reviewed history from 01/29/2009 and no changes required. R TKR   Family History: Reviewed history from 10/20/2007 and no changes required. non contrib Family History COPD  brother died recently 2007/12/23  Social History: Reviewed history from 10/20/2007 and no changes required. Patient states former smoker.  retired  Review of Systems       The patient complains of shortness of breath with activity and non-productive cough.  The patient denies shortness of breath at rest, productive cough, coughing up blood, chest pain, irregular heartbeats, acid heartburn, indigestion, loss of appetite, weight change, abdominal pain, difficulty swallowing, sore throat, tooth/dental problems, headaches, nasal congestion/difficulty breathing through nose, sneezing, itching, ear ache, anxiety, depression, hand/feet swelling, joint stiffness or pain, rash, change in color of mucus, and fever.    Vital Signs:  Patient profile:   77 year old male Height:      73 inches Weight:      199 pounds BMI:     26.35 O2 Sat:      96 % on Room air Temp:     97.7 degrees F oral Pulse rate:   92 / minute BP sitting:   130 / 66  (right arm) Cuff size:   regular  Vitals Entered By: Gweneth Dimitri RN (September 01, 2010 4:34 PM)  O2 Flow:  Room air CC: 6 month follow up.  States states breathing is slightly worse when doing activities -onset x 2 months.  Wheezing at times.   Occas cough with a small amount of white mucus.   Comments Medications reviewed with patient Daytime contact number verified with patient. Gweneth Dimitri RN  September 01, 2010 4:35 PM    Physical Exam  Additional Exam:  Gen: Pleasant, well nourished, in no distress ENT: no lesions, no postnasal drip Neck: No JVD, no TMG, no carotid bruits Lungs: no use of accessory muscles, no dullness to percussion, distant BS,  Cardiovascular: RRR, heart sounds normal, no murmurs or gallops, no peripheral edema Musculoskeletal: No deformities, no cyanosis or clubbing     Impression & Recommendations:  Problem # 1:  COPD (ICD-496) Assessment Unchanged  stable copd with AB and emphysema component plan cont  symbicort to two  puff two times a day  pt demonstrated 100% efficacy of hfa technique   Medications Added to Medication List This Visit: 1)  Stool Softener 100 Mg Caps (Docusate sodium) .... Take 1 capsule by mouth once a day 2)  Abilify 2 Mg Tabs (Aripiprazole) .... Take 1 tablet by mouth once a day 3)  Indomethacin 50 Mg Caps (Indomethacin) .... One by mouth three times a day  Complete Medication List: 1)  Venlafaxine Hcl 150 Mg Xr24h-cap (Venlafaxine hcl) .... Two times a day 2)  Symbicort 160-4.5 Mcg/act Aero (Budesonide-formoterol fumarate) .... Inhale 2  puff two times a day 3)  Prilosec 20 Mg Cpdr (Omeprazole) .Marland Kitchen.. 1 by mouth daily 4)  Mirtazapine 30 Mg Tabs (Mirtazapine) .... At bedtime 5)  Aspirin 81 Mg Tabs (Aspirin) .Marland Kitchen.. 1 by mouth daily 6)  Zoloft 100 Mg Tabs (Sertraline hcl) .... Once daily 7)  Stool Softener 100 Mg Caps (Docusate sodium) .... Take 1 capsule by mouth  once a day 8)  Vitamin D3 10000 Unit Caps (Cholecalciferol) .... Take 5 weekly 9)  Ventolin Hfa 108 (90 Base) Mcg/act Aers (Albuterol sulfate) .Marland Kitchen.. 1-2 puffs every 4-6 hours as needed 10)  Alprazolam 0.5 Mg Tabs (Alprazolam) .... As needed 11)  Abilify 2 Mg Tabs (Aripiprazole) .... Take 1 tablet by mouth once a day 12)  Indomethacin 50 Mg Caps (Indomethacin) .... One by mouth three times a day  Other Orders: Est. Patient Level III (16109)  Patient Instructions: 1)  No change in medications 2)  Return in     4-5     months   Orders Added: 1)  Est. Patient Level III [60454]    Immunization History:  Influenza Immunization History:    Influenza:  historical (04/25/2010)   Appended Document: Pulmonary OV fax don Christell Constant

## 2010-10-11 LAB — CBC
HCT: 38.6 % — ABNORMAL LOW (ref 39.0–52.0)
Hemoglobin: 13.1 g/dL (ref 13.0–17.0)
MCV: 87 fL (ref 78.0–100.0)
Platelets: 212 10*3/uL (ref 150–400)
RBC: 4.44 MIL/uL (ref 4.22–5.81)
WBC: 7.8 10*3/uL (ref 4.0–10.5)

## 2010-10-11 LAB — BASIC METABOLIC PANEL
Chloride: 106 mEq/L (ref 96–112)
GFR calc Af Amer: >60 mL/min (ref >60)
Potassium: 4.2 mEq/L (ref 3.5–5.1)
Sodium: 141 mEq/L (ref 135–145)

## 2010-10-11 LAB — APTT: aPTT: 26 seconds (ref 24–37)

## 2010-10-13 LAB — DIFFERENTIAL
Basophils Absolute: 0 10*3/uL (ref 0.0–0.1)
Eosinophils Absolute: 0.8 10*3/uL — ABNORMAL HIGH (ref 0.0–0.7)
Lymphocytes Relative: 24 % (ref 12–46)
Monocytes Relative: 10 % (ref 3–12)
Neutro Abs: 5.3 10*3/uL (ref 1.7–7.7)
Neutrophils Relative %: 57 % (ref 43–77)

## 2010-10-13 LAB — ETHANOL: Alcohol, Ethyl (B): <5 mg/dL (ref 0–10)

## 2010-10-13 LAB — URINALYSIS, ROUTINE W REFLEX MICROSCOPIC
Hgb urine dipstick: NEGATIVE
Nitrite: NEGATIVE
Specific Gravity, Urine: 1.009 (ref 1.005–1.030)
Urobilinogen, UA: 0.2 mg/dL (ref 0.0–1.0)
pH: 6 (ref 5.0–8.0)

## 2010-10-13 LAB — RAPID URINE DRUG SCREEN, HOSP PERFORMED
Amphetamines: NOT DETECTED
Barbiturates: NOT DETECTED

## 2010-10-13 LAB — CBC
HCT: 34.8 % — ABNORMAL LOW (ref 39.0–52.0)
Platelets: 239 10*3/uL (ref 150–400)
WBC: 9.2 10*3/uL (ref 4.0–10.5)

## 2010-10-13 LAB — BASIC METABOLIC PANEL
BUN: 16 mg/dL (ref 6–23)
GFR calc non Af Amer: 56 mL/min — ABNORMAL LOW (ref >60)
Potassium: 3.6 mEq/L (ref 3.5–5.1)

## 2010-10-25 DIAGNOSIS — K922 Gastrointestinal hemorrhage, unspecified: Secondary | ICD-10-CM

## 2010-10-25 HISTORY — DX: Gastrointestinal hemorrhage, unspecified: K92.2

## 2010-10-26 ENCOUNTER — Emergency Department (HOSPITAL_COMMUNITY): Payer: Medicare Other

## 2010-10-26 ENCOUNTER — Inpatient Hospital Stay (HOSPITAL_COMMUNITY)
Admission: EM | Admit: 2010-10-26 | Discharge: 2010-10-30 | DRG: 394 | Disposition: A | Discharge status: Home / Self Care | Payer: Medicare Other | Attending: Internal Medicine | Admitting: Internal Medicine

## 2010-10-26 DIAGNOSIS — D500 Iron deficiency anemia secondary to blood loss (chronic): Secondary | ICD-10-CM | POA: Diagnosis present

## 2010-10-26 DIAGNOSIS — D62 Acute posthemorrhagic anemia: Secondary | ICD-10-CM | POA: Diagnosis present

## 2010-10-26 DIAGNOSIS — F341 Dysthymic disorder: Secondary | ICD-10-CM | POA: Diagnosis present

## 2010-10-26 DIAGNOSIS — K620 Anal polyp: Secondary | ICD-10-CM | POA: Diagnosis present

## 2010-10-26 DIAGNOSIS — K5730 Diverticulosis of large intestine without perforation or abscess without bleeding: Secondary | ICD-10-CM | POA: Diagnosis present

## 2010-10-26 DIAGNOSIS — E039 Hypothyroidism, unspecified: Secondary | ICD-10-CM | POA: Diagnosis present

## 2010-10-26 DIAGNOSIS — J441 Chronic obstructive pulmonary disease with (acute) exacerbation: Secondary | ICD-10-CM | POA: Diagnosis present

## 2010-10-26 DIAGNOSIS — D126 Benign neoplasm of colon, unspecified: Secondary | ICD-10-CM | POA: Diagnosis present

## 2010-10-26 DIAGNOSIS — K633 Ulcer of intestine: Principal | ICD-10-CM | POA: Diagnosis present

## 2010-10-26 DIAGNOSIS — D131 Benign neoplasm of stomach: Secondary | ICD-10-CM | POA: Diagnosis present

## 2010-10-26 LAB — COMPREHENSIVE METABOLIC PANEL
ALT: 17 U/L (ref 0–53)
AST: 26 U/L (ref 0–37)
CO2: 24 mEq/L (ref 19–32)
Calcium: 8.4 mg/dL (ref 8.4–10.5)
GFR calc Af Amer: >60 mL/min (ref >60)
Potassium: 3.6 mEq/L (ref 3.5–5.1)
Sodium: 137 mEq/L (ref 135–145)
Total Protein: 6.9 g/dL (ref 6.0–8.3)

## 2010-10-26 LAB — URINALYSIS, ROUTINE W REFLEX MICROSCOPIC
Bilirubin Urine: NEGATIVE
Glucose, UA: NEGATIVE mg/dL
Hgb urine dipstick: NEGATIVE
Specific Gravity, Urine: 1.01 (ref 1.005–1.030)
Urobilinogen, UA: 0.2 mg/dL (ref 0.0–1.0)
pH: 6 (ref 5.0–8.0)

## 2010-10-26 LAB — CBC
HCT: 23.5 % — ABNORMAL LOW (ref 39.0–52.0)
Hemoglobin: 6.7 g/dL — CL (ref 13.0–17.0)
MCH: 20.3 pg — ABNORMAL LOW (ref 26.0–34.0)
MCV: 71.2 fL — ABNORMAL LOW (ref 78.0–100.0)
RBC: 3.3 MIL/uL — ABNORMAL LOW (ref 4.22–5.81)

## 2010-10-26 LAB — DIFFERENTIAL
Basophils Relative: 1 % (ref 0–1)
Eosinophils Relative: 17 % — ABNORMAL HIGH (ref 0–5)
Lymphocytes Relative: 16 % (ref 12–46)
Monocytes Relative: 8 % (ref 3–12)
Neutro Abs: 4.9 10*3/uL (ref 1.7–7.7)

## 2010-10-26 LAB — PROTIME-INR: Prothrombin Time: 13.5 seconds (ref 11.6–15.2)

## 2010-10-27 DIAGNOSIS — D508 Other iron deficiency anemias: Secondary | ICD-10-CM

## 2010-10-27 DIAGNOSIS — K219 Gastro-esophageal reflux disease without esophagitis: Secondary | ICD-10-CM

## 2010-10-27 LAB — FERRITIN: Ferritin: 10 ng/mL — ABNORMAL LOW (ref 22–322)

## 2010-10-27 LAB — BASIC METABOLIC PANEL
Calcium: 8.6 mg/dL (ref 8.4–10.5)
Creatinine, Ser: 0.98 mg/dL (ref 0.4–1.5)
GFR calc Af Amer: >60 mL/min (ref >60)
GFR calc non Af Amer: >60 mL/min (ref >60)
Glucose, Bld: 113 mg/dL — ABNORMAL HIGH (ref 70–99)
Sodium: 139 mEq/L (ref 135–145)

## 2010-10-27 LAB — CBC
MCH: 21.8 pg — ABNORMAL LOW (ref 26.0–34.0)
MCHC: 29.9 g/dL — ABNORMAL LOW (ref 30.0–36.0)
RDW: 19.5 % — ABNORMAL HIGH (ref 11.5–15.5)

## 2010-10-27 LAB — DIFFERENTIAL
Basophils Absolute: 0.1 10*3/uL (ref 0.0–0.1)
Basophils Relative: 1 % (ref 0–1)
Eosinophils Absolute: 1.7 10*3/uL — ABNORMAL HIGH (ref 0.0–0.7)
Eosinophils Relative: 19 % — ABNORMAL HIGH (ref 0–5)
Monocytes Absolute: 0.6 10*3/uL (ref 0.1–1.0)

## 2010-10-27 LAB — IRON AND TIBC
Saturation Ratios: 4 % — ABNORMAL LOW (ref 20–55)
TIBC: 454 ug/dL — ABNORMAL HIGH (ref 215–435)
UIBC: 436 ug/dL

## 2010-10-27 LAB — VITAMIN B12: Vitamin B-12: 443 pg/mL (ref 211–911)

## 2010-10-27 LAB — T4, FREE: Free T4: 0.64 ng/dL — ABNORMAL LOW (ref 0.80–1.80)

## 2010-10-27 LAB — TSH: TSH: 4.245 u[IU]/mL (ref 0.350–4.500)

## 2010-10-28 ENCOUNTER — Other Ambulatory Visit: Payer: Self-pay | Admitting: Gastroenterology

## 2010-10-28 DIAGNOSIS — D126 Benign neoplasm of colon, unspecified: Secondary | ICD-10-CM

## 2010-10-28 DIAGNOSIS — K5730 Diverticulosis of large intestine without perforation or abscess without bleeding: Secondary | ICD-10-CM

## 2010-10-28 DIAGNOSIS — D131 Benign neoplasm of stomach: Secondary | ICD-10-CM

## 2010-10-28 DIAGNOSIS — D509 Iron deficiency anemia, unspecified: Secondary | ICD-10-CM

## 2010-10-28 DIAGNOSIS — K633 Ulcer of intestine: Secondary | ICD-10-CM

## 2010-10-28 LAB — DIFFERENTIAL
Basophils Relative: 1 % (ref 0–1)
Lymphs Abs: 1.3 10*3/uL (ref 0.7–4.0)
Monocytes Absolute: 0.8 10*3/uL (ref 0.1–1.0)
Monocytes Relative: 9 % (ref 3–12)
Neutro Abs: 5.1 10*3/uL (ref 1.7–7.7)

## 2010-10-28 LAB — CBC
Hemoglobin: 9 g/dL — ABNORMAL LOW (ref 13.0–17.0)
MCH: 21.6 pg — ABNORMAL LOW (ref 26.0–34.0)
MCHC: 29.5 g/dL — ABNORMAL LOW (ref 30.0–36.0)
MCV: 73.1 fL — ABNORMAL LOW (ref 78.0–100.0)
RBC: 4.17 MIL/uL — ABNORMAL LOW (ref 4.22–5.81)

## 2010-10-29 ENCOUNTER — Inpatient Hospital Stay (HOSPITAL_COMMUNITY): Payer: Medicare Other

## 2010-10-29 ENCOUNTER — Encounter: Payer: Self-pay | Admitting: Nurse Practitioner

## 2010-10-29 DIAGNOSIS — I6529 Occlusion and stenosis of unspecified carotid artery: Secondary | ICD-10-CM | POA: Insufficient documentation

## 2010-10-29 DIAGNOSIS — I671 Cerebral aneurysm, nonruptured: Secondary | ICD-10-CM | POA: Insufficient documentation

## 2010-10-29 DIAGNOSIS — K5790 Diverticulosis of intestine, part unspecified, without perforation or abscess without bleeding: Secondary | ICD-10-CM

## 2010-10-29 DIAGNOSIS — N281 Cyst of kidney, acquired: Secondary | ICD-10-CM

## 2010-10-29 DIAGNOSIS — D519 Vitamin B12 deficiency anemia, unspecified: Secondary | ICD-10-CM | POA: Insufficient documentation

## 2010-10-29 DIAGNOSIS — C61 Malignant neoplasm of prostate: Secondary | ICD-10-CM

## 2010-10-29 DIAGNOSIS — K633 Ulcer of intestine: Secondary | ICD-10-CM

## 2010-10-29 DIAGNOSIS — Z86010 Personal history of colonic polyps: Secondary | ICD-10-CM

## 2010-10-29 DIAGNOSIS — D509 Iron deficiency anemia, unspecified: Secondary | ICD-10-CM

## 2010-10-29 LAB — DIFFERENTIAL
Eosinophils Absolute: 0.9 10*3/uL — ABNORMAL HIGH (ref 0.0–0.7)
Lymphs Abs: 1.3 10*3/uL (ref 0.7–4.0)
Monocytes Relative: 7 % (ref 3–12)
Neutro Abs: 7.5 10*3/uL (ref 1.7–7.7)
Neutrophils Relative %: 71 % (ref 43–77)

## 2010-10-29 LAB — BASIC METABOLIC PANEL
BUN: 10 mg/dL (ref 6–23)
CO2: 26 mEq/L (ref 19–32)
Chloride: 99 mEq/L (ref 96–112)
Glucose, Bld: 79 mg/dL (ref 70–99)
Potassium: 3.7 mEq/L (ref 3.5–5.1)

## 2010-10-29 LAB — CBC
HCT: 31.7 % — ABNORMAL LOW (ref 39.0–52.0)
Hemoglobin: 9.4 g/dL — ABNORMAL LOW (ref 13.0–17.0)
MCH: 22.1 pg — ABNORMAL LOW (ref 26.0–34.0)
MCV: 74.4 fL — ABNORMAL LOW (ref 78.0–100.0)
RBC: 4.26 MIL/uL (ref 4.22–5.81)

## 2010-10-29 NOTE — H&P (Signed)
NAME:  [name], STENCIL NO.:  [id number]  MEDICAL RECORD NO.:  [id number]           PATIENT TYPE:  I  LOCATION:  IC01                          FACILITY:  [facility]  PHYSICIAN:  Elliot Cousin, M.D.    DATE OF BIRTH:  1930/02/11  DATE OF ADMISSION:  10/26/2010 DATE OF DISCHARGE:  LH                             HISTORY & PHYSICAL   PRIMARY CARE PHYSICIAN:  Ernestina Penna, MD  PRIMARY GASTROENTEROLOGIST:  Danise Edge, MD of [facility]  PRIMARY PULMONOLOGIST:  Charlcie Cradle. Delford Field, MD, FCCP  CHIEF COMPLAINT:  The patient was advised to come to the emergency department for treatment of anemia.  The patient complains of fatigue and shortness of breath.  HISTORY OF PRESENT ILLNESS:  The patient is a 77 year old man with a past medical history significant for diverticulosis, colon polyps, severe emphysema, and gastroesophageal reflux disease.  He presents to the emergency department today after his physician, Dr. Rudi Heap (via his PA) advised him to come to the emergency department for treatment of anemia.  The patient presented to Dr. Kathi Der office approximately 4 days ago for a regular checkup.  During the history and physical, he complained of lightheadedness, easy fatigue, and feeling off balance.  Apparently blood work was ordered.  Per the information I gathered, the patient's hemoglobin was found to be 7.2 in Dr. Kathi Der office.  The patient was advised to come to the emergency department for further evaluation.  In the emergency department currently, his hemoglobin is 6.7.  He denies black tarry stools or bright red blood per rectum.  He takes Aleve only on occasion for arthritic pain.  He takes aspirin 81 mg daily.  He takes no other NSAIDs.  He only drinks alcohol on occasion, but not daily.  He denies hematemesis, bright red blood per rectum, or black tarry stools.  He denies chest pain.  He has had shortness of breath and easy fatigability.   He has also had some cramping-like pain in his hips and his legs.  He says that he had a colonoscopy in 2011 by Dr. Danise Edge of [facility] in [place].  However, the colonoscopy was nondiagnostic as the colon preparation was poor according to the patient.  The patient is currently hemodynamically stable.  His PT and PTT are within normal limits at 28 and 1.01 respectively.  His liver transaminases are within normal limits.  His white blood cell count is within normal limits at 8.6.  His hemoglobin is 6.7 and hematocrit is 23.5.  His MCV is 71.2.  His platelet count is 232.  He is being admitted for further evaluation and management.  PAST MEDICAL HISTORY: 1. Severe emphysema/COPD. 2. History of pneumonia on three occurrences in the past 2 years. 3. History of prostate cancer.  The patient says that he did not     undergo any treatment.  His last biopsy by Dr. Earlene Plater was negative     for cancer cells according to the patient. 4. History of intracranial aneurysm, status post coiling on August 27, 2008, by Interventional Radiology.  5. Depression with anxiety. 6. Gastroesophageal reflux disease. 7. History of short-lived PSVT. 8. Status post left shoulder surgery in 2003. 9. Diverticulosis and colon polyps per the colonoscopy in 2003 by Dr.     Danise Edge.  Another colonoscopy in November 2010 yielded an     adenomatous polyp. 10.Gout. 11.Status post right total knee replacement approximately 13 years     ago.  MEDICATIONS:  The patient is unsure of all of his medications and all of the doses. 1. Effexor 150 mg b.i.d. 2. Zoloft 100 mg 2 tablets at bedtime. 3. Prilosec 20 mg daily. 4. Aspirin 81 mg daily. 5. Symbicort inhaler 160/4.5 two puffs b.i.d. 6. Xanax 0.5 mg each morning. 7. Gout medicines three times daily.  ALLERGIES:  No known drug allergies.  SOCIAL HISTORY:  The patient is widowed.  [place]  He has 3  children.  One of his daughters is a Designer, jewellery.  He is retired from the [employer], engineering.  He smoked for over 50 years before stopping several years ago.  He drinks wine only on occasion.  He denies illicit drug use.  FAMILY HISTORY:  His mother died of old age at 92 years of age.  His father died of lung cancer at 49 years of age.  PHYSICAL EXAMINATION:  VITAL SIGNS:  Temperature 98.1, blood pressure 150/74, pulse 78, respiratory rate 20, oxygen saturation 95% on room air. GENERAL:  The patient is a pleasant 77 year old Caucasian man, who is currently sitting up in bed, in no acute distress. HEENT:  Head is normocephalic, nontraumatic.  Pupils equal, round, and reactive to light.  Extraocular muscles are intact.  Conjunctivae are clear.  Sclerae are white and pale.  Nasal mucosa is dry.  No sinus tenderness.  Oropharynx reveals mildly dry mucous membranes.  No posterior exudates or erythema.  The mucous membranes are pale. NECK:  Supple.  No adenopathy, no thyromegaly, no bruit, no JVD. LUNGS:  Breathing is nonlabored.  A few crackles and wheezes auscultated bilaterally. HEART:  S1 and S2 with no murmurs, rubs, or gallops. ABDOMEN:  Mildly obese, positive bowel sounds, soft, nontender, nondistended.  No hepatosplenomegaly.  No masses palpated. GU And Rectal:  Deferred. EXTREMITIES:  Pedal pulses are palpable bilaterally.  No pretibial edema and no pedal edema.  No acute hot red joints. NEUROLOGIC:  The patient is alert and oriented x3.  Cranial nerves II through XII are intact.  Strength is 5/5 throughout.  Sensation is intact. PSYCHOLOGIC:  The patient has a pleasant affect.  He is cooperative. His speech is clear.  LABORATORY DATA:  Admission laboratories; sodium 137, potassium 3.6, chloride 104, CO2 of 24, glucose 133, BUN 20, creatinine 1.02, calcium 8.4, total protein 6.9, albumin 3.3, AST 26, ALT 17, PTT 28, PT 13.5, INR 1.01.  WBC 8.6, hemoglobin 6.7, hematocrit  23.5, MCV 71.2, platelet count 232.  ASSESSMENT: 1. Severe anemia.  The patient's anemia appears to be chronic.  He is     hemodynamically stable.  His stool was Hemoccult negative per the     exam by emergency department physician, Dr. Iantha Fallen.  He has had     several colonoscopies over the past 7 or 8 years.  According to the     patient, his latest colonoscopy was by Dr. Laural Benes in 2011.  The     results were nondiagnostic as the preparation was poor.  The     patient has no complaints of abdominal  pain.  Nevertheless, the     source of his anemia is likely GI in origin. 2. Mild hyperglycemia.  The patient's venous glucose is 133.  This is     not fasting.  He has no history of diabetes mellitus. 3. Severe emphysema/chronic obstructive pulmonary disease.  The     patient is treated chronically with Symbicort.  He is followed by     pulmonologist, Dr. Shan Levans.  He does have some bronchospasms     on exam. 4. Depression with anxiety.  This is currently stable.  He is treated     chronically with Effexor, Zoloft, and Xanax.  PLAN: 1. The patient's blood was typed and crossed for 4 units.  We will     transfuse 2 units today and check a followup CBC. 2. We will consult gastroenterologist, Dr. Darrick Penna.  In fact, Dr.     Darrick Penna has already been notified about the patient. 3. For further evaluation, we will order a CT scan of the abdomen and     pelvis to rule out an overt source of GI bleeding or     otherwise. 4. We will hold aspirin for now. 5. We will continue proton pump inhibitor therapy with Protonix. 6. An anemia panel was ordered in the emergency department.  We will     follow up on the results. 7. We will continue Symbicort inhaler.  We will add albuterol inhaler     three times daily for active bronchospasms.     Elliot Cousin, M.D.     DF/MEDQ  D:  10/26/2010  T:  10/26/2010  Job:  [id number]  cc:   Ernestina Penna, M.D. Fax: [phone]  Danise Edge,  M.D. Fax: [phone]  Charlcie Cradle. Delford Field, MD, FCCP [address] [place] [address]  Electronically Signed by Elliot Cousin M.D. on 10/29/2010 46:96:29 PM

## 2010-10-30 LAB — CBC
HCT: 29.4 % — ABNORMAL LOW (ref 39.0–52.0)
MCH: 21.5 pg — ABNORMAL LOW (ref 26.0–34.0)
MCV: 74.4 fL — ABNORMAL LOW (ref 78.0–100.0)
Platelets: 183 10*3/uL (ref 150–400)
RBC: 3.95 MIL/uL — ABNORMAL LOW (ref 4.22–5.81)
RDW: 21.3 % — ABNORMAL HIGH (ref 11.5–15.5)

## 2010-10-30 LAB — BASIC METABOLIC PANEL
BUN: 12 mg/dL (ref 6–23)
Chloride: 105 mEq/L (ref 96–112)
Creatinine, Ser: 0.96 mg/dL (ref 0.4–1.5)
Glucose, Bld: 94 mg/dL (ref 70–99)

## 2010-10-30 LAB — DIFFERENTIAL
Basophils Relative: 0 % (ref 0–1)
Eosinophils Relative: 8 % — ABNORMAL HIGH (ref 0–5)
Lymphs Abs: 1.1 10*3/uL (ref 0.7–4.0)
Monocytes Absolute: 0.7 10*3/uL (ref 0.1–1.0)

## 2010-10-30 LAB — BRAIN NATRIURETIC PEPTIDE: Pro B Natriuretic peptide (BNP): 119 pg/mL — ABNORMAL HIGH (ref 0.0–100.0)

## 2010-10-31 LAB — CROSSMATCH
ABO/RH(D): A POS
Antibody Screen: NEGATIVE
Unit division: 0
Unit division: 0

## 2010-11-02 NOTE — Op Note (Signed)
NAME:  [name], [name]                   ACCOUNT NO.:  [id number]  MEDICAL RECORD NO.:  [id number]           PATIENT TYPE:  I  LOCATION:  A212                          FACILITY:  [facility]  PHYSICIAN:  Jonette Eva, M.D.     DATE OF BIRTH:  [birth year]  DATE OF PROCEDURE:  10/28/2010 DATE OF DISCHARGE:                              OPERATIVE REPORT   PRIMARY PHYSICIAN:  Ernestina Penna, MD  REFERRING PHYSICIAN:  Elliot Cousin, MD  PROCEDURE:  Ileocolonoscopy with cold forceps, polypectomy, and cold forceps biopsy of ileocecal lesion with spot tattoo/resolution clip placement x2/BICAP (20)/2 epinephrine injection.  INDICATION FOR EXAM:  [name] is a 77 year old male who has had reportedly 4 attempts at colonoscopy in past year.  He has had poor bowel prep.  His last complete colonoscopy was in 2003.  He was noted to have left-sided diverticulosis.  He had a 1-mm polyp removed.  The ileocecal valve was noted to be normal.  The prep was "satisfactory." He presented to the emergency department with a microcytic anemia.  His hemoglobin was 6.7 with an MCV of 71.2.  Platelets were normal.  His platelet, coags, and liver function tests were normal except for an albumin of 3.3.  Iron panel showed a ferritin of 10.  He is MRSA.  He denied black tarry stools, hematemesis, or bright red blood per rectum. He does take aspirin daily.  He has a history of prostate cancer.  FINDINGS: 1. Inadequate prep.  I attempted to aspirate the contents of his colon     and multiple times the suction channel and the suction tubing were     clogged with debris.  Polyps less than 5-mm would have been missed. 2. Frequent diverticula seen throughout the colon, most pronounced in     the left colon.  A two 3-mm sessile sigmoid colon polyps removed     via cold forceps.  Three sessile rectal polyps removed via cold     forceps.  The polyps were placed in the same bottle. 3. The ileocecal fold extending into the valve  had an ulcerated lesion     which was actively oozing.  It appeared to be ulcerated lesion in     the center of a adenomatous polyp.  Biopsies were obtained via cold     forceps.  The area was marked with spot (4 mL).  It was marked on     the right and left edges.  The lesion continued to actively ooze     and BICAP was applied (20 watts).  Hemostasis was not achieved.     Two hemoclips were placed.  It appeared hemostasis was achieved.  A     2 mL of epinephrine were injected as well as into the right edge of     the site.  Otherwise no masses noted. 4. Normal terminal ileum approximately 10 cm visualized. 5. Normal esophagus without evidence of Barrett's mass, erosion,     ulceration, or stricture. 6. Mild erythema in the antrum associated with small sessile polyps,     gastric polyps.  Biopsies obtained via cold forceps. 7. Normal duodenal bulb and second portion of the duodenum. 8. No old blood or fresh blood seen in the stomach or the duodenum. 9. Small internal hemorrhoids, otherwise normal retroflex view of the     rectum.  DIAGNOSES: 1. Ileocecal valve lesion most likely source for iron-deficiency     anemia. 2. Mild gastritis. 3. Moderate left-sided diverticulosis. 4. Small internal hemorrhoids.  RECOMMENDATIONS: 1. The patient was given a card and should not have an MRI for 30 days     due to clip placement. 2. No aspirin or NSAIDs.  No anticoagulation for 7 days. 3. We will await biopsies. 4. Advance diet. 5. We will schedule next colonoscopy based on the biopsy findings.  He     should have a 2-day bowel prep for his next colonoscopy.  MEDICATIONS:  Propofol provided by anesthesia.  PROCEDURE TECHNIQUE:  Physical exam was performed.  Informed consent was obtained from the patient explaining the benefits, risks, and alternatives to the procedure.  The patient was connected to monitor and placed in left lateral position.  Continuous oxygen was provided by nasal  cannula and IV medicine administered through an indwelling cannula.  After administration of sedation and rectal exam, the patient's rectum was intubated.  The scope was advanced under direct visualization to the distal terminal ileum.  The scope was removed slowly by careful exam, the color, texture, anatomy, and integrity mucosa on the way out.  After the colonoscopy, the patient's esophagus was intubated with the diagnostic gastroscope.  The scope was advanced under direct visualization to the second portion of the duodenum.  The scope was removed slowly by careful exam, the color, texture, anatomy, and integrity mucosa on the way out.  The patient was recovered in endoscopy and discharged to the floor in satisfactory condition.  PATH: NSAID ULCER, SIMPLE ADENOMAS-TCS w/i next year 2 day bowel prep. STOP ASA unless medically necessary.  Jonette Eva, M.D.     SF/MEDQ  D:  10/28/2010  T:  10/28/2010  Job:  [id number]  cc:   Danise Edge, M.D. Fax: [phone]  Ernestina Penna, M.D. Fax: [phone]  Electronically Signed by Jonette Eva M.D. on 11/02/2010 11:25:31 AM

## 2010-11-03 LAB — BASIC METABOLIC PANEL
BUN: 16 mg/dL (ref 6–23)
BUN: 10 mg/dL (ref 6–23)
CO2: 25 mEq/L (ref 19–32)
Calcium: 8.6 mg/dL (ref 8.4–10.5)
Chloride: 108 mEq/L (ref 96–112)
Chloride: 109 mEq/L (ref 96–112)
Creatinine, Ser: 1.19 mg/dL (ref 0.4–1.5)
Creatinine, Ser: 1.01 mg/dL (ref 0.4–1.5)
GFR calc Af Amer: >60 mL/min (ref >60)
Glucose, Bld: 132 mg/dL — ABNORMAL HIGH (ref 70–99)
Glucose, Bld: 142 mg/dL — ABNORMAL HIGH (ref 70–99)

## 2010-11-03 LAB — CBC
HCT: 33.4 % — ABNORMAL LOW (ref 39.0–52.0)
MCHC: 33.4 g/dL (ref 30.0–36.0)
MCHC: 33.8 g/dL (ref 30.0–36.0)
MCV: 86.4 fL (ref 78.0–100.0)
MCV: 89.6 fL (ref 78.0–100.0)
Platelets: 224 10*3/uL (ref 150–400)
Platelets: 213 10*3/uL (ref 150–400)
RDW: 15.3 % (ref 11.5–15.5)
RDW: 14.5 % (ref 11.5–15.5)
WBC: 6.4 10*3/uL (ref 4.0–10.5)

## 2010-11-03 LAB — GLUCOSE, CAPILLARY
Glucose-Capillary: 107 mg/dL — ABNORMAL HIGH (ref 70–99)
Glucose-Capillary: 108 mg/dL — ABNORMAL HIGH (ref 70–99)
Glucose-Capillary: 148 mg/dL — ABNORMAL HIGH (ref 70–99)

## 2010-11-03 LAB — MAGNESIUM: Magnesium: 2.5 mg/dL (ref 1.5–2.5)

## 2010-11-03 LAB — PHOSPHORUS: Phosphorus: 3 mg/dL (ref 2.3–4.6)

## 2010-11-03 LAB — PROTIME-INR: Prothrombin Time: 13.7 seconds (ref 11.6–15.2)

## 2010-11-03 NOTE — Progress Notes (Signed)
FAXED PATH TO DR. NYLAND AN DR. Laural Benes

## 2010-11-03 NOTE — Discharge Summary (Signed)
[name], [name]                   ACCOUNT NO.:  [id number]  MEDICAL RECORD NO.:  [id number]           PATIENT TYPE:  I  LOCATION:  A319                          FACILITY:  [facility]  PHYSICIAN:  Elliot Cousin, M.D.    DATE OF BIRTH:  1929-12-14  DATE OF ADMISSION:  10/26/2010 DATE OF DISCHARGE:  04/06/2012LH                              DISCHARGE SUMMARY   DISCHARGE DIAGNOSES: 1. Acute/subacute gastrointestinal bleeding secondary to ileocecal     valve ulcerative lesion which was actively oozing per ileo-     colonoscopy.  Status post cold forceps biopsy.  The result of the     pathology report was pending at the time of hospital discharge. 2. Diverticulosis, small internal hemorrhoids, and colon polyps,     status post polypectomy per ileocolonoscopy. 3. Mild gastritis and gastric polyps, per EGD. 4. Acute blood loss anemia.  The patient's hemoglobin was 6.7 on     admission and 8.5 at the time of hospital discharge.  The results     of the anemia panel prior to transfusion revealed a total iron of     18, TIBC of 454, vitamin B12 of 443, folate of greater than 20, and     ferritin of 10. 5. Hypertension. 6. Severe emphysema/chronic obstructive pulmonary disease with     bronchitic exacerbation. 7. Hypothyroidism, new diagnosis.  The patient's free T4 was low at     0.64 and his TSH was within normal limits at 4.2.  The patient will     need to have his free T4 and TSH reassessed in 4 weeks.  DISCHARGE MEDICATIONS: 1. Amlodipine 2.5 mg daily. (new medication). 2. Colace 100 mg b.i.d. 3. Ferrous sulfate 325 mg b.i.d.(new medication). 4. Levothyroxine 25 mcg 1 tablet daily (new medication). 5. Z-Pak 5-day course to take as directed. 6. Albuterol inhaler 2 puffs 3 times daily. 7. Effexor XR 150 mg b.i.d. 8. Prilosec 20 mg daily. 9. Symbicort 160/4.5 mcg 2 puffs b.i.d. 10.Xanax 0.5 mg daily as needed. 11.Zoloft 100 mg 2 tablets daily. 12.Stop aspirin. 13.Stop  Indomethacin. 14.Stop Aleve.  DISCHARGE DISPOSITION:  The patient was discharged to home in improved and stable condition on October 30, 2010.  He will follow up with gastroenterologist Dr. Darrick Penna in approximately 3 months.  He was advised to follow up with his primary care physician Dr. Christell Constant in 1-2 weeks. He will need to have his hemoglobin/hematocrit rechecked in 1-2 weeks.  He will need to have his TSH and free T4 rechecked in approximately 4 weeks.  CONSULTATIONS:  Jonette Eva MD  PROCEDURE PERFORMED: 1. Chest x-ray on October 29, 2010.  The results revealed chronic-     appearing pulmonary interstitial changes.  No acute cardiopulmonary     abnormality. 2. Ileocolonoscopy by Dr. Jonette Eva on October 28, 2010.  The     results revealed an inadequate preparation.  Frequent diverticula     seen throughout the colon, most pronounced in the left colon.  Two     3-mm sessile sigmoid colon polyps, removed via cold forceps.  Three  sessile rectal polyps, removed via cold forceps.  The ileocecal     fold extending into the valve had an ulcerated lesion which was     actively oozing.  Biopsies were obtained via cold forceps.  BICAP     was applied.  Hemostasis was not achieved.  Two Hemoclips were     placed.  Hemostasis was achieved.  Two mL of epinephrine were     injected. 3. An EGD on October 28, 2010.  Normal esophagus.  Mild erythema in the     antrum associated with small sessile polyps and gastric polyps.     Biopsies were obtained via cold forceps.  Normal duodenal bulb and     second portion of the duodenum.  Small internal hemorrhoids. 4. CT scan of the abdomen and pelvis without contrast on October 26, 2010.  The results revealed no evidence of retroperitoneal     hemorrhage or other acute findings.  Mildly enlarged prostate.  No     evidence of metastatic disease.  Diverticulosis.  No radiographic     evidence of diverticulitis.  HISTORY OF PRESENT ILLNESS:  The patient is a  78 year old man with a past medical history significant for diverticulosis, emphysema, intracranial aneurysm, status post coiling in 2010, and gastroesophageal reflux disease.  He presented to the emergency department on October 26, 2010, after his primary care physician advised him to come for evaluation of anemia.  Apparently, the patient's hemoglobin was found to be 7.2 in Dr. Kathi Der office.  When he was evaluated in the emergency department, his hemoglobin was 6.7.  His PT and PTT were within normal limits.  His liver transaminases were within normal limits.  His MCV was 71.2.  His platelet count was 232. He was admitted for further evaluation and management.  HOSPITAL COURSE: 1. ACUTE BLOOD LOSS ANEMIA, ILEOCECAL VALVE ULCERATED LESION,     DIVERTICULOSIS, COLON POLYPS, AND MILD GASTRITIS.  The patient was     typed and crossed 4 units of packed red blood cells and transfused     2 units during the first 24 hours of the hospitalization.  His     hemoglobin and hematocrit were followed closely.  His hemoglobin     did improve to a high of 9.4 before stabilizing at 8.5 prior to     discharge.  An anemia panel was ordered prior to the transfusions.  The results were dictated above.  A CT scan of the abdomen and     pelvis was ordered for evaluation of a mass or source of GI     bleeding.  The CT was unremarkable for hemorrhage or metastatic     disease.  Gastroenterologist, Dr. Darrick Penna was consulted.  She     evaluated the patient and proceeded with an EGD and colonoscopy.     The results were dictated above.  However, the most significant     finding was of the ileocecal folds extending into the valve that     had an ulcerated lesion which was actively oozing.  She was able to     obtain hemostasis following BICAP, clipping, and epinephrine     injection.  The patient was maintained on proton pump inhibitor     therapy which had been restarted at the time of the initial     assessment.   He had been treated with omeprazole before.     Per the recommendation by Dr. Darrick Penna, the patient was  instructed not to take aspirin or any NSAIDs.  He had been taking     Aleve, baby aspirin, and indomethacin for arthritic pain.  The     results of the biopsies were pending at the time of hospital     discharge.  Dr. Darrick Penna will inform the patient of the pathology     results when available next week.  He was discharged to home on     omeprazole and ferrous sulfate b.i.d. 2. HYPERTENSION.  The patient had a remote history of hypertension but     had not been treated with an antihypertensive medication.  His     blood pressures were moderately elevated.  Norvasc was therefore     started at 2.5 mg b.i.d.  The dose was decreased to once daily     prior to discharge. 3. HYPOTHYROIDISM.  The patient's free T4 was low at 0.64, however,     his TSH was within normal limits at 4.2.       However, given the moderately low free T4, he     was started on Synthroid at 25 mcg daily.  He will need to have his     free T4 and TSH rechecked in approximately 4 weeks.  Further     management will be then deferred to Dr. Christell Constant. 4. CHRONIC OBSTRUCTIVE PULMONARY DISEASE/EMPHYSEMA WITH MILD     EXACERBATION.  The patient was noted to be wheezing on admission.     He stated that he wheezes all the time.  Nevertheless, he was     restarted on Symbicort and albuterol/Atrovent nebulizers were     administered every 6 hours.  Rocephin and azithromycin were started     empirically although the patient was afebrile and his white blood     cell count was within normal limits.  His chest x-ray revealed     chronic interstitial changes but no active infiltrates or pulmonary     edema.  He was discharged on Symbicort, albuterol inhaler, and     azithromycin.     Elliot Cousin, M.D.     DF/MEDQ  D:  10/30/2010  T:  10/31/2010  Job:  [id number]  cc:   Jonette Eva, M.D. [address] [place] , [place]  [address]  Ernestina Penna, M.D. Fax: [phone]  Danise Edge, M.D. Fax: [phone]  Charlcie Cradle. Delford Field, MD, FCCP [address] [place] [address]  Electronically Signed by Elliot Cousin M.D. on 11/03/2010 09:20:39 AM

## 2010-11-04 LAB — POCT I-STAT 3, ART BLOOD GAS (G3+)
Acid-base deficit: 2 mmol/L (ref 0.0–2.0)
pCO2 arterial: 34.7 mmHg — ABNORMAL LOW (ref 35.0–45.0)
pH, Arterial: 7.412 (ref 7.350–7.450)
pO2, Arterial: 44 mmHg — ABNORMAL LOW (ref 80.0–100.0)

## 2010-11-04 LAB — IRON AND TIBC
Iron: 15 ug/dL — ABNORMAL LOW (ref 42–135)
TIBC: 359 ug/dL (ref 215–435)

## 2010-11-04 LAB — URINALYSIS, ROUTINE W REFLEX MICROSCOPIC
Bilirubin Urine: NEGATIVE
Nitrite: NEGATIVE
Specific Gravity, Urine: 1.006 (ref 1.005–1.030)
Urobilinogen, UA: 0.2 mg/dL (ref 0.0–1.0)
pH: 7 (ref 5.0–8.0)

## 2010-11-04 LAB — CBC
MCHC: 33.8 g/dL (ref 30.0–36.0)
RBC: 3.48 MIL/uL — ABNORMAL LOW (ref 4.22–5.81)
RDW: 14.8 % (ref 11.5–15.5)

## 2010-11-04 LAB — COMPREHENSIVE METABOLIC PANEL
ALT: 17 U/L (ref 0–53)
AST: 25 U/L (ref 0–37)
Alkaline Phosphatase: 101 U/L (ref 39–117)
CO2: 25 mEq/L (ref 19–32)
Calcium: 8.4 mg/dL (ref 8.4–10.5)
GFR calc Af Amer: >60 mL/min (ref >60)
Potassium: 4 mEq/L (ref 3.5–5.1)
Sodium: 137 mEq/L (ref 135–145)
Total Protein: 6.7 g/dL (ref 6.0–8.3)

## 2010-11-04 LAB — CULTURE, BLOOD (ROUTINE X 2)
Culture: NO GROWTH
Culture: NO GROWTH

## 2010-11-04 LAB — DIFFERENTIAL
Eosinophils Absolute: 0.5 10*3/uL (ref 0.0–0.7)
Eosinophils Relative: 4 % (ref 0–5)
Lymphs Abs: 1.5 10*3/uL (ref 0.7–4.0)
Monocytes Absolute: 1 10*3/uL (ref 0.1–1.0)
Monocytes Relative: 9 % (ref 3–12)

## 2010-11-04 LAB — POCT CARDIAC MARKERS
CKMB, poc: 1.1 ng/mL (ref 1.0–8.0)
Troponin i, poc: <0.05 ng/mL (ref 0.00–0.09)

## 2010-11-04 LAB — FERRITIN: Ferritin: 62 ng/mL (ref 22–322)

## 2010-11-04 LAB — GLUCOSE, CAPILLARY: Glucose-Capillary: 130 mg/dL — ABNORMAL HIGH (ref 70–99)

## 2010-11-04 LAB — RETICULOCYTES: RBC.: 3.53 MIL/uL — ABNORMAL LOW (ref 4.22–5.81)

## 2010-11-06 ENCOUNTER — Telehealth: Payer: Self-pay

## 2010-11-06 NOTE — Telephone Encounter (Signed)
Pt called and was informed of his colonoscopy results. He had appt here 03/03/2011 with Dr. Darrick Penna.

## 2010-11-09 LAB — DIFFERENTIAL
Lymphs Abs: 2.1 10*3/uL (ref 0.7–4.0)
Monocytes Relative: 11 % (ref 3–12)
Neutro Abs: 2.8 10*3/uL (ref 1.7–7.7)
Neutrophils Relative %: 43 % (ref 43–77)

## 2010-11-09 LAB — BASIC METABOLIC PANEL
Calcium: 8.7 mg/dL (ref 8.4–10.5)
Chloride: 105 mEq/L (ref 96–112)
Creatinine, Ser: 1.12 mg/dL (ref 0.4–1.5)
GFR calc Af Amer: >60 mL/min (ref >60)

## 2010-11-09 LAB — PROTIME-INR: Prothrombin Time: 12.3 seconds (ref 11.6–15.2)

## 2010-11-09 LAB — APTT: aPTT: 26 seconds (ref 24–37)

## 2010-11-09 LAB — CBC
MCV: 94.9 fL (ref 78.0–100.0)
RBC: 4.23 MIL/uL (ref 4.22–5.81)
WBC: 6.6 10*3/uL (ref 4.0–10.5)

## 2010-11-10 LAB — BASIC METABOLIC PANEL
BUN: 11 mg/dL (ref 6–23)
BUN: 12 mg/dL (ref 6–23)
BUN: 12 mg/dL (ref 6–23)
CO2: 23 mEq/L (ref 19–32)
CO2: 22 mEq/L (ref 19–32)
Chloride: 102 mEq/L (ref 96–112)
Chloride: 103 mEq/L (ref 96–112)
Chloride: 106 mEq/L (ref 96–112)
Chloride: 104 mEq/L (ref 96–112)
Creatinine, Ser: 1.07 mg/dL (ref 0.4–1.5)
Creatinine, Ser: 0.78 mg/dL (ref 0.4–1.5)
GFR calc Af Amer: >60 mL/min (ref >60)
GFR calc Af Amer: >60 mL/min (ref >60)
GFR calc non Af Amer: >60 mL/min (ref >60)
Glucose, Bld: 110 mg/dL — ABNORMAL HIGH (ref 70–99)
Glucose, Bld: 118 mg/dL — ABNORMAL HIGH (ref 70–99)
Glucose, Bld: 124 mg/dL — ABNORMAL HIGH (ref 70–99)
Glucose, Bld: 164 mg/dL — ABNORMAL HIGH (ref 70–99)
Potassium: 3.9 mEq/L (ref 3.5–5.1)
Potassium: 4.1 mEq/L (ref 3.5–5.1)
Potassium: 4.3 mEq/L (ref 3.5–5.1)
Sodium: 134 mEq/L — ABNORMAL LOW (ref 135–145)

## 2010-11-10 LAB — DIFFERENTIAL
Eosinophils Absolute: 0.1 10*3/uL (ref 0.0–0.7)
Eosinophils Relative: 0 % (ref 0–5)
Eosinophils Relative: 3 % (ref 0–5)
Lymphocytes Relative: 7 % — ABNORMAL LOW (ref 12–46)
Lymphocytes Relative: 8 % — ABNORMAL LOW (ref 12–46)
Lymphs Abs: 1 10*3/uL (ref 0.7–4.0)
Lymphs Abs: 1.2 10*3/uL (ref 0.7–4.0)
Monocytes Absolute: 0.8 10*3/uL (ref 0.1–1.0)
Monocytes Absolute: 1 10*3/uL (ref 0.1–1.0)
Monocytes Relative: 6 % (ref 3–12)
Monocytes Relative: 6 % (ref 3–12)

## 2010-11-10 LAB — URINE MICROSCOPIC-ADD ON

## 2010-11-10 LAB — CBC
HCT: 32.5 % — ABNORMAL LOW (ref 39.0–52.0)
HCT: 35.6 % — ABNORMAL LOW (ref 39.0–52.0)
HCT: 36.8 % — ABNORMAL LOW (ref 39.0–52.0)
HCT: 36.9 % — ABNORMAL LOW (ref 39.0–52.0)
Hemoglobin: 11.3 g/dL — ABNORMAL LOW (ref 13.0–17.0)
Hemoglobin: 12.7 g/dL — ABNORMAL LOW (ref 13.0–17.0)
Hemoglobin: 12.6 g/dL — ABNORMAL LOW (ref 13.0–17.0)
MCHC: 34.5 g/dL (ref 30.0–36.0)
MCHC: 32.9 g/dL (ref 30.0–36.0)
MCV: 92.9 fL (ref 78.0–100.0)
MCV: 93.8 fL (ref 78.0–100.0)
MCV: 94 fL (ref 78.0–100.0)
MCV: 94.4 fL (ref 78.0–100.0)
MCV: 94.8 fL (ref 78.0–100.0)
Platelets: 209 10*3/uL (ref 150–400)
Platelets: 238 10*3/uL (ref 150–400)
Platelets: 254 10*3/uL (ref 150–400)
Platelets: 284 10*3/uL (ref 150–400)
RBC: 3.47 MIL/uL — ABNORMAL LOW (ref 4.22–5.81)
RBC: 3.51 MIL/uL — ABNORMAL LOW (ref 4.22–5.81)
RBC: 3.92 MIL/uL — ABNORMAL LOW (ref 4.22–5.81)
RBC: 4.02 MIL/uL — ABNORMAL LOW (ref 4.22–5.81)
RDW: 13.9 % (ref 11.5–15.5)
WBC: 12.4 10*3/uL — ABNORMAL HIGH (ref 4.0–10.5)
WBC: 17.5 10*3/uL — ABNORMAL HIGH (ref 4.0–10.5)
WBC: 8.2 10*3/uL (ref 4.0–10.5)

## 2010-11-10 LAB — LEGIONELLA ANTIGEN, URINE

## 2010-11-10 LAB — COMPREHENSIVE METABOLIC PANEL
ALT: 16 U/L (ref 0–53)
AST: 20 U/L (ref 0–37)
Alkaline Phosphatase: 55 U/L (ref 39–117)
BUN: 16 mg/dL (ref 6–23)
CO2: 21 mEq/L (ref 19–32)
CO2: 23 mEq/L (ref 19–32)
Calcium: 7.8 mg/dL — ABNORMAL LOW (ref 8.4–10.5)
Calcium: 8.1 mg/dL — ABNORMAL LOW (ref 8.4–10.5)
Chloride: 107 mEq/L (ref 96–112)
Creatinine, Ser: 0.98 mg/dL (ref 0.4–1.5)
GFR calc Af Amer: >60 mL/min (ref >60)
GFR calc non Af Amer: >60 mL/min (ref >60)
GFR calc non Af Amer: >60 mL/min (ref >60)
Potassium: 4 mEq/L (ref 3.5–5.1)
Sodium: 132 mEq/L — ABNORMAL LOW (ref 135–145)
Total Bilirubin: 0.4 mg/dL (ref 0.3–1.2)
Total Protein: 6.4 g/dL (ref 6.0–8.3)

## 2010-11-10 LAB — CARDIAC PANEL(CRET KIN+CKTOT+MB+TROPI)
CK, MB: 0.8 ng/mL (ref 0.3–4.0)
Relative Index: INVALID (ref 0.0–2.5)
Total CK: 35 U/L (ref 7–232)
Troponin I: <0.01 ng/mL (ref 0.00–0.06)

## 2010-11-10 LAB — POCT I-STAT, CHEM 8
BUN: 18 mg/dL (ref 6–23)
Creatinine, Ser: 1 mg/dL (ref 0.4–1.5)
Glucose, Bld: 112 mg/dL — ABNORMAL HIGH (ref 70–99)
Hemoglobin: 13.6 g/dL (ref 13.0–17.0)
Sodium: 136 mEq/L (ref 135–145)
TCO2: 25 mmol/L (ref 0–100)

## 2010-11-10 LAB — URINALYSIS, ROUTINE W REFLEX MICROSCOPIC
Glucose, UA: 100 mg/dL — AB
Ketones, ur: NEGATIVE mg/dL
Leukocytes, UA: NEGATIVE
Nitrite: NEGATIVE
Protein, ur: 30 mg/dL — AB
Urobilinogen, UA: 0.2 mg/dL (ref 0.0–1.0)

## 2010-11-10 LAB — MAGNESIUM: Magnesium: 2.3 mg/dL (ref 1.5–2.5)

## 2010-11-10 LAB — POCT CARDIAC MARKERS: Myoglobin, poc: 81.2 ng/mL (ref 12–200)

## 2010-11-10 LAB — EXPECTORATED SPUTUM ASSESSMENT W GRAM STAIN, RFLX TO RESP C

## 2010-12-03 NOTE — Consult Note (Signed)
NAME:  [name], RODINO NO.:  [id number]  MEDICAL RECORD NO.:  [id number]           PATIENT TYPE:  I  LOCATION:  IC01                          FACILITY:  [facility]  PHYSICIAN:  Jonette Eva, M.D.     DATE OF BIRTH:  25-Feb-1930  DATE OF CONSULTATION:  10/27/2010 DATE OF DISCHARGE:                                CONSULTATION   REQUESTING PHYSICIAN:  [facility] [facility]  PHYSICIAN SIGNING NOTE:  Jonette Eva, MD.  PRIMARY CARE PHYSICIAN:  Ernestina Penna, MD.  PRIMARY PULMONOLOGIST:  Charlcie Cradle. Delford Field, MD, FCCP.  UROLOGIST:  Dr. Earlene Plater at [facility].  PRIMARY GASTROENTEROLOGIST:  Dr. Laural Benes at the [facility].  REASON FOR CONSULTATION:  Iron deficiency anemia.  HISTORY OF PRESENT ILLNESS:  [name] is a 77 year old Caucasian male who was seen last week in his primary care physician's office for routine visit with and had complaints of fatigue and shortness of breath on exertion.  He was admitted yesterday after routine blood work showed profound anemia.  His hemoglobin upon admission was 6.8.  Has had 2 units of packed RBCs and hemoglobin is up to 8.8 Today.  He denies any chest pain or palpitations.  He denies any nausea, vomiting, or abdominal pain.  He denies any rectal bleeding, melena, diarrhea, or constipation.  He has been on Prilosec 20 mg q.a.m. for about 1 year. Denies any previous EGDs.  Denies any dysphagia or odynophagia.  Denies any anorexia, fever, or chills.  He has had a nonproductive cough. Denies any hemoptysis.  He tells me several years ago, he tried to donate blood as he had done this routinely, but was told he was anemic. He believes this was about 5 years ago.  He does take an aspirin 81 mg daily.  He is also taking 2 Aleve once or twice per week.  His INR was 1.01, ferritin 10, iron 18, TIBC 454.  UIBC, B12, and folate were normal.  PAST MEDICAL AND SURGICAL HISTORY:  He has history of  GERD x1 year, history of adenomatous polyps, last colonoscopy was by Dr. Laural Benes in 2011.  The patient describes a poor prep and incomplete exam.  He had a colonoscopy in 1998 and 2003 where he believes he had adenomatous polyps and diverticulosis.  He had a intracranial aneurysm in 2010 behind his right eye.  He has history of depression, anxiety, PSVT.  He had a right knee replacement, shoulder surgery and was diagnosed with prostate cancer last year, is being followed by Dr. Earlene Plater for this.  MEDICATIONS PRIOR TO ADMISSION: 1. Effexor 150 mg b.i.d. 2. Zoloft 200 mg nightly. 3. Prilosec 20 mg daily. 4. Aspirin 81 mg daily. 5. Symbicort inhaler 160/4.5 two puffs b.i.d. 6. Xanax 0.5 mg p.r.n. daily. 7. Unknown gout medicine 3 times a day.  ALLERGIES:  No known drug allergies.  FAMILY HISTORY:  There is no known family history of colorectal carcinoma or chronic GI problems.  SOCIAL HISTORY:  Mr. Cokley resides in [place] alone.  He is a widower. Lost his wife  in 2010 with Alzheimer's.  He has a 50+ pack-year history of tobacco abuse, but quit in 2004.  He rarely consumes alcohol.  He denies any drug use.  He has 3 healthy children.  He is a retired IT trainer.  REVIEW OF SYSTEMS:  See HPI, otherwise negative review of systems.  PHYSICAL EXAMINATION:  VITAL SIGNS:  Temperature 97.8, pulse 76, respirations 17, blood pressure 153/74.  Weight is 86.1 kg and height is 73 inches. GENERAL:  He is a well-developed, well-nourished elderly male in no acute distress. HEENT:  Sclerae nonicteric.  Conjunctivae pink.  Oropharynx pink and moist without any lesions.  He wears dentures. NECK:  Supple without masses or thyromegaly. HEART:  Regular rate and rhythm.  Normal S1 and S2 without murmurs, clicks, rubs, or gallops. LUNGS:  Expiratory wheezes bilaterally.  No acute distress. ABDOMEN:  Positive bowel sounds x4.  No bruits auscultated.  Abdomen is soft, nontender, nondistended without  palpable mass or hepatosplenomegaly.  No rebound tenderness or guarding. EXTREMITIES:  Without edema.  He does have clubbing. RECTAL:  There are no external or internal lesions noted.  He has small amount of medium brown stool, which was Hemoccult negative obtained from the vault.  LABORATORY STUDIES:  White blood cell count 9.3, platelets 206, calcium 8.6.  Sodium 139 potassium 4.3, chloride 108, CO2 is 25, BUN 11, creatinine 0.98, and glucose 113.  Albumin 3.3, total bilirubin 0.4, alkaline phosphatase 73, AST 26, ALT 17, total protein 6.9.  His urinalysis was negative and he was positive for MRSA.  He had a CT of the abdomen and pelvis without contrast on April 02, which showed a mildly enlarged prostate, small hepatic cyst, and diverticulosis.  IMPRESSION:  [name] is a 77-year Caucasian male with iron deficiency anemia, gastroesophageal reflux disease x1 year (new onset), history of adenomatous colon polyps who presents with profound anemia.  His hemoglobin was 6.7, now at 8.8 status post 2 units packed RBCs.  He denies any GI concerns at this time.  He has history of prostate carcinoma without treatment followed by Dr. Earlene Plater at [facility].  Hemoccult negative on exam today.  There was no evidence of GI bleeding at this time.  Anemia may be multifactorial.  His last colonoscopy was in 2011 with a poor prep, which warrant further evaluation to rule out colorectal carcinoma or polyps.  I have discussed this with Dr. Jonette Eva.  He will also need an EGD to rule out Barrett esophagus or malignancy given his new-onset GERD in an elderly Caucasian male over the past year, although his symptoms are well controlled on PPI.  He has history of prostate carcinoma, which could be contributing to his anemia and malabsorption remains in the differential as well.  PLAN: 1. Agree with PPI. 2. Follow H and H.  Transfuse to keep stable around 9 g. 3.  We will proceed with colonoscopy and EGD with Dr. Darrick Penna tomorrow.     I have discussed risks and benefits, which include, but not limited     to infection, perforation, drug reaction.  He agrees with plan and     consent will be obtained. 4. Further recommendations pending procedures.     Lorenza Burton, N.P.   ______________________________ Jonette Eva, M.D.    KJ/MEDQ  D:  10/27/2010  T:  10/27/2010  Job:  [id number]  cc:   Charlcie Cradle. Delford Field, MD, FCCP [address] [employer] [place] [address]  Ernestina Penna, M.D.  Fax: [phone]  Dr. Laural Benes  Dr. Earlene Plater [facility]  Electronically Signed by Lorenza Burton N.P. on 11/19/2010 08:10:02 AM Electronically Signed by Jonette Eva M.D. on 12/03/2010 05:02:47 PM

## 2010-12-08 NOTE — Assessment & Plan Note (Signed)
[facility]                             PULMONARY OFFICE NOTE   NAME:ANGELSenica, Crall                          MRN:          [id number]  DATE:12/14/2006                            DOB:          06-01-1930    Mr. Rhames is a 77 year old white male with a history of longstanding  chronic obstructive lung disease with a primary emphysematous component.  He has been on Advair 500/50 since we last saw him in March.  Pulmonary-  wise, he is less short of breath, coughing less.  He did develop a bout  of left upper lobe community-acquired pneumonia two weeks ago, for which  he received a seven day course of Avelox.  Symptoms are better now.  He  has a follow-up chest x-ray pending in two days at the [facility].   CURRENT MEDICATIONS:  1. Nexium 40 mg daily.  2. Remeron 15 mg nightly.  3. Zoloft 50 mg daily.   PHYSICAL EXAMINATION:  VITAL SIGNS:  Temp 97.5, blood pressure 130/80,  pulse 72, saturation 92% on room air.  CHEST:  Diminished breath sounds showing no wheeze or rhonchi.  CARDIAC:  Regular rate and rhythm without S3.  Normal S1 and S2.  ABDOMEN:  Soft and nontender.  EXTREMITIES:  No clubbing or edema.  SKIN:  Clear.   Pulmonary functions obtained today show an FEV1 of 75% predicted, FVC of  105% predicted, FEV1/FVC ratio of 47% predicted.  Total lung capacity of  117% predicted and diffusion capacity at 61% predicted.   IMPRESSION:  Chronic obstructive lung disease with primary emphysematous  component.   PLAN:  The plan is to maintain the Advair at 500/50 1 spray b.i.d.  A  sample was given.  We will see the patient back in return followup.  A  note is made that a repeat chest x-ray will be obtained this week at  [facility].     Charlcie Cradle Delford Field, MD, [facility]  Electronically Signed    PEW/MedQ  DD: 12/14/2006  DT: 12/14/2006  Job #: [id number]   cc:   Ernestina Penna, M.D.

## 2010-12-08 NOTE — Consult Note (Signed)
[name], [name]                   ACCOUNT NO.:  [id number]   MEDICAL RECORD NO.:  [id number]          PATIENT TYPE:  OUT   LOCATION:  XRAY                         FACILITY:  [facility]   PHYSICIAN:  Sanjeev K. Deveshwar, M.D.DATE OF BIRTH:  Apr 19, 1930   DATE OF CONSULTATION:  07/01/2008  DATE OF DISCHARGE:                                 CONSULTATION   DATE OF CONSULTATION:  July 01, 2008   CHIEF COMPLAINT:  Cerebral aneurysm.   HISTORY OF PRESENT ILLNESS:  This is a pleasant 77 year old male who was  admitted to [facility] on June 17, 2008, after falling and  hitting his head at his place of residence.  The patient was noted to be  dehydrated.  He was also suffering from a urinary tract infection and  possibly a sinus infection.  It was felt that these were possibly the  cause of his fall.  The patient does not have a significant memory of  the events.   During his stay at [facility], he had an MRI that showed a  questionable cerebral aneurysm.  A cerebral angiogram was recommended  and performed by Dr. Corliss Skains on June 21, 2008.  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  Dr. Corliss Skains did not feel that this had  anything to do with his fall.  The patient presents today accompanied by  his daughter who is a nurse to discuss cerebral aneurysms and possible  treatment options.   PAST MEDICAL HISTORY:  Significant for severe COPD.  The patient quit  smoking 5 years ago.  He did smoke 2 packs of cigarettes per day for at  least 50 years.  He has a history of falls with an unsteady gait.  He  has BPH and was recently diagnosed with early prostate cancer, which is  being followed by Dr. Darvin Neighbours.  He is due to see Dr. Earlene Plater next week  to discuss treatment options.  The patient also has a history of  pneumonia last month.  He does have a history of depression.  There is a  previous  history of alcohol abuse.   SURGICAL HISTORY:  The patient has had a recent prostate biopsy.  He has  had previous knee surgery.  He denied any problems with anesthesia.   ALLERGIES:  No known drug allergies.   CURRENT MEDICATIONS:  Remeron, Advair, Effexor, Xanax, meclizine.   SOCIAL HISTORY:  The patient is married.  Unfortunately, his wife has  dementia and resides at a nursing home.  He does have 3 supportive  children.  The patient lives alone in [place].  He quit smoking 5 years  ago.  He did smoke 2 packs per day for at least 50 years.  He previously  drank 6-8 beers per day.  The patient's daughter states that he no  longer drinks to excess.  He is a retired Naval architect.   FAMILY HISTORY:  His mother died at age 16.  She had diabetes and  cardiovascular disease.  His father died at age 57 and I believe from  lung cancer.   IMPRESSION AND PLAN:  As noted, the patient presents today accompanied  by his daughter who is an Charity fundraiser to discuss his recently diagnosed cerebral  aneurysm and treatment options.  Aneurysms were described in detail.  The patient and his daughter were also given some patient education  materials to study at home.  Treatment options were also discussed along  with potential risks and benefits.  The options included continued  monitoring of the aneurysm versus open craniotomy and clipping versus  endovascular coiling and/or stenting.  The options were described in  detail and all their questions were answered.   The patient is anxious to follow up with Dr. Su Hilt, his urologist in  order to formulate a treatment plan for his recently diagnosed prostate  cancer.  He feels that he would like to delay treatment of the cerebral  aneurysm until he has a better plan for his prostate cancer.  Dr.  Corliss Skains instructed the patient and his daughter to go home and review  the written materials and to give this matter some further thought.  He  encouraged them to call  if they have any questions.  We have also  recommended that the patient start taking an 81-mg aspirin daily.  If  the patient should decide to proceed with endovascular treatment of the  aneurysm, he would require general anesthesia.  We recommended an  evaluation by Dr. Shan Levans, his pulmonologist in order to be  cleared for anesthesia.  The patient would also be placed on Plavix 3  days prior to the intervention in anticipation of a possible stent  placement.  Greater than 1 hour was spent on this consult.      Delton See, P.A.    ______________________________  Grandville Silos. Corliss Skains, M.D.    DR/MEDQ  D:  07/01/2008  T:  07/02/2008  Job:  [id number]   cc:   Ernestina Penna, M.D.  Charlcie Cradle Delford Field, MD, FCCP

## 2010-12-08 NOTE — Discharge Summary (Signed)
[name], [name]                   ACCOUNT NO.:  [id number]   MEDICAL RECORD NO.:  [id number]          PATIENT TYPE:  INP   LOCATION:  4742                         FACILITY:  [facility]   PHYSICIAN:  Michelene Gardener, MD    DATE OF BIRTH:  [birth year]   DATE OF ADMISSION:  11/22/2008  DATE OF DISCHARGE:  11/25/2008                               DISCHARGE SUMMARY   To mention, this patient has two medical records.  His other medical  record is [id number].   DISCHARGE DIAGNOSES:  1. Right lower lobe community-acquired pneumonia.  2. History of chronic obstructive pulmonary disease with very mild      wheezes on admission that resolved in the first 24 hours.  3. Normocytic anemia.  4. Mild dehydration that resolved.  5. History of prostatic cancer.  6. History of supraventricular tachycardia with heart rate stable      during this hospitalization.  7. Gastroesophageal reflux disease.  8. History of depression.  9. History of intracranial aneurysm, status post clotting in August 27, 2008.   DISCHARGE MEDICATIONS:  1. Avelox 400 mg p.o. once daily x7 days.  2. Advair discus 250/50 one puff twice daily.  3. Remeron 30 mg p.o. at bedtime.  4. Prilosec 20 mg p.o. once daily.  5. Xanax 0.5 mg p.o. twice daily as needed.  6. Vitamin B12 injections once a month.   CONSULTATIONS:  None.   PROCEDURES:  None.   DIAGNOSTIC STUDIES:  1. Chest x-ray on November 22, 2008, showed COPD with suspected early      right lower lobe infiltrates.  2. Repeat x-ray on Nov 24, 2008, showed interstitial densities that      have been stable without evidence of new consolidation.   Follow up with primary doctor within a week.   COURSE OF HOSPITALIZATION:  This is a 77 year old male with multiple  medical problems who admitted to the hospital for evaluation of  pneumonia.  Initially, chest x-ray was done on November 22, 2008, results  were mentioned above.  The patient was admitted to the hospital.  He was  started on Rocephin and Zithromax IV.  Oxygen was supplied to keep his  saturation above 90%.  He was put on nebulizer treatment.  He was given  Solu-Medrol in the first 24 hours.  Throughout his hospitalization, his  Rocephin and Zithromax was continued.  His oxygen was tapered down and  has been off oxygen for the last 48 hours with saturation above 90%.  He  continued to receive nebulizer treatments as needed.  Repeat x-ray was  done on Nov 24, 2008, and it showed just chronic findings with resolution  of the infiltrate that is seen initially.  His Solu-Medrol was only  continued for 24 hours and then it was discharged because of lack of  wheezes.  This patient had mild drop in hemoglobin from the last time he  has been at the hospital and because of previous history of tumors, I  recommended him to follow with his primary doctor to get occult  blood  and to be further evaluated if needed.  As a matter of fact, occult  blood was ordered in the hospital, but he does not have any stool.  The  patient's hemoglobin remained stable, so it is felt not necessary to do  this in inpatient and that could be followed as an outpatient.   Otherwise, other medical conditions remained stable in this  hospitalization.  The patient will be discharged home on all  preadmission medications and will get Avelox for 7 days.  Total  assessment time is 40 minutes.      Michelene Gardener, MD  Electronically Signed     NAE/MEDQ  D:  11/25/2008  T:  11/25/2008  Job:  [id number]

## 2010-12-08 NOTE — Discharge Summary (Signed)
[name], [name]                   ACCOUNT NO.:  [id number]   MEDICAL RECORD NO.:  [id number]          PATIENT TYPE:  AMB   LOCATION:  SDS                          FACILITY:  [facility]   PHYSICIAN:  Beckey Rutter, MD  DATE OF BIRTH:  [birth year]   DATE OF ADMISSION:  06/21/2008  DATE OF DISCHARGE:                               DISCHARGE SUMMARY   PRIMARY CARE PHYSICIAN:  DICTATION ENDS AT THIS POINT      Beckey Rutter, MD     EME/MEDQ  D:  06/22/2008  T:  06/22/2008  Job:  [id number]

## 2010-12-08 NOTE — Consult Note (Signed)
[name], [name]                   ACCOUNT NO.:  [id number]   MEDICAL RECORD NO.:  [id number]          PATIENT TYPE:  INP   LOCATION:  4702                         FACILITY:  [facility]   PHYSICIAN:  Charlcie Cradle. Delford Field, MD, FCCPDATE OF BIRTH:  1929-07-28   DATE OF CONSULTATION:  09/04/2008  DATE OF DISCHARGE:                                 CONSULTATION   CHIEF COMPLAINT:  Pneumonia.   HISTORY OF PRESENT ILLNESS:  This 77 year old male admitted on September 01, 2008, with increased dyspnea and cough.  He has underlying  centrilobular emphysema, found to have airspace disease in left upper  and left lower lobe, and hypoxemia.  He was given IV Zosyn, vancomycin,  and Zithromax given the fact that he has been in a nursing home stating  visiting his spouse who did expire on August 21, 2008.  The patient is  improving and now has been switched to Avelox.  Previously, the patient  had had biapical chronic changes felt to be due to chronic granulomatous  disease including potential mycobacterial disease.  However, cultures  were never really obtained to prove this.  Followup CT scan in December  2008 showed the areas had improved.  Last visit in the office was in  October 2009, at that point he was doing well with no complaints and no  recurrent pneumonia.  He is maintained for COPD with Advair 250/50 one  spray b.i.d.  He did receive flu vaccine and H1N1 vaccine this past  fall.  Currently, he is improving.  Swallowing test during this  admission showed no evidence of dysphagia.   PHYSICAL EXAMINATION:  VITAL SIGNS:  Temperature is 99, blood pressure  140/82, pulse 82, respirations 20, and saturation 91%.  CHEST:  Distant breath sound, few scattered wheezes.  CARDIAC:  Regular rate and rhythm without S3.  Normal S1 and S2.  ABDOMEN:  Soft and nontender.  EXTREMITIES:  No edema, clubbing, or venous disease.  SKIN:  Clear.  NEUROLOGIC:  Intact.  HEENT:  No jugular venous distension or  lymphadenopathy.  Oropharynx  clear.  NECK:  Supple.   LABORATORY DATA:  Chest x-ray and CT scan of chest review showed nodular  infiltrate in left upper, left middle, and lower lung zones.  Centrilobular emphysematous changes are noted.  Sodium was 135,  potassium 4.1, chloride 103, CO2 26, BUN 11, creatinine 0.93, and blood  sugar 124.  Liver functions unremarkable.  White count was 84,00 and  hemoglobin 12.5.  White count initially was 12,400 on admission.  Sputum  cultures are not obtained.  No blood cultures obtained.  Strep urine  antigen and Legionella urine antigen are negative.   IMPRESSION:  1. Community-acquired pneumonia, severe, likely hospital acquired is a      potential organism source given the fact that the patient has been      visiting a nursing home recently.  2. Underlying supraventricular tachycardia, now resolved.  3. Swallowing evaluation is normal.  4. Centrilobular emphysema.   RECOMMENDATIONS:  Okay to switch to oral Avelox, switch to oral  prednisone slow  taper, discontinue oxygen, give flutter valve, continue  Advair, switch nebulizers to p.r.n., and discharged to home as planned  on September 05, 2008.      Charlcie Cradle Delford Field, MD, [facility]  Electronically Signed     PEW/MEDQ  D:  09/04/2008  T:  09/05/2008  Job:  [id number]   cc:   Ernestina Penna, M.D.  [facility]

## 2010-12-08 NOTE — H&P (Signed)
[name], [name]                   ACCOUNT NO.:  [id number]   MEDICAL RECORD NO.:  [id number]          PATIENT TYPE:  OIB   LOCATION:  3172                         FACILITY:  [facility]   PHYSICIAN:  Sanjeev K. Deveshwar, M.D.DATE OF BIRTH:  06/01/1930   DATE OF ADMISSION:  08/27/2008  DATE OF DISCHARGE:                              HISTORY & PHYSICAL   CHIEF COMPLAINT:  Cerebral aneurysm.   HISTORY OF PRESENT ILLNESS:  This is a pleasant 77 year old male who was  admitted to [facility] on June 17, 2008 after falling and  hitting his head at his home.  The patient was noted to be dehydrated at  time the of admission.  He was also noted to have a urinary tract  infection with a possible sinus infection.  The patient did not have any  significant memory of the event.  During his stay a [facility]  he had an MRI that showed a possible cerebral aneurysm.  A cerebral  angiogram was performed by Sanjeev K. Deveshwar, M.D. 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  The patient was  seen in consultation by Simonne Maffucci K. Corliss Skains, M.D. on July 01, 2008.  At that time he was accompanied by his daughter who is a Engineer, civil (consulting).  Cerebral aneurysms were discussed in detail, as well as treatment  options along with their risks and benefits.  The patient elected to  proceed with endovascular treatment and is admitted to [facility] on August 27, 2008 for intervention.   PAST MEDICAL HISTORY:  Significant for:  1. Severe COPD.  The patient quit smoking 5 years ago.  He did smoke      up to two packs of cigarettes per day for at least 50 years.  He      has seen Charlcie Cradle. Delford Field, MD, FCCP as a pulmonologist in the      past.  2. The patient has a history of falls with an unsteady gait.  He has      had several recent falls prior to this admission  3. He has BPH with early prostate  cancer being followed by Windy Fast L.      Earlene Plater, M.D.  4. The patient has a previous history of pneumonia.  5. He has history of depression.  6. There is a previous history of alcohol abuse.   SURGICAL HISTORY:  1. The patient had a recent prostate biopsy.  2. He has had a previous knee surgery.  3. He denies any problems with anesthesia.   ALLERGIES:  No known drug allergies.   MEDICATIONS AT TIME OF ADMISSION:  Include:  1. Cephalexin which was started for a skin tear of his left upper      extremity after a recent fall.  2. He is also on Xanax.  3. He was started on Plavix 3 days prior to admission in anticipation      of possible stent placement.  4. He is on  aspirin.  5. Omeprazole.  6. Advair.  7. He takes mirtazapine at bedtime as needed minute.   SOCIAL HISTORY:  The patient is married.  His wife has advanced dementia  and resides at a nursing home.  He does have three supportive children.  The patient lives alone in [place].  He quit smoking 5 years ago.  He  did smoke up to two packs per day for 50 years.  He previously drank 6-8  beers per day.  He states now he only has an occasional drink.  He is a  retired Naval architect.   FAMILY HISTORY:  His mother died at age 7.  She had diabetes and  cardiovascular disease.  His father died at age 15, I believe from lung  cancer.   REVIEW OF SYSTEMS:  The patient has baseline dyspnea secondary to COPD.  He has had a recent cough with clear sputum which he also feels is his  baseline.  He has had some recent problems with constipation.  He does  have arthritis.  He reports bruising easily, as noted he has had  multiple falls.  He had a skin tear of his left upper extremity recently  and is now taking Keflex   LABORATORY DATA:  INR was 0.9, PTT was 26.  BUN was 18, creatinine 1.12,  potassium 4.4, GFR was greater than 60, glucose 102.  CBC revealed  hemoglobin 13.4, hematocrit 40.1, WBCs were 6.6, platelets 225,000.    PHYSICAL EXAMINATION:  Revealed a pleasant 77 year old white male in no  acute distress.  VITAL SIGNS:  Blood pressure 149/86, pulse 73, respirations 18,  temperature 98.3, oxygen saturation 94% on room air.  HEENT:  Unremarkable.  NECK:  Revealed no bruits.  HEART:  Revealed regular rate and rhythm with distant heart sounds.  LUNGS:  Decreased with end-expiratory wheezes.  ABDOMEN:  Soft, nontender.  EXTREMITIES:  Revealed pulses to be intact without any significant  edema.  His airway was rated at A1.  His ASA scale was a III.  NEUROLOGICAL EXAM:  The patient was alert and oriented and followed all  commands.  Cranial nerves II-XII grossly intact.  Cerebellar testing was  intact with mild tremor bilaterally.  Motor strength was  5/5.  Sensation was intact to light touch.   IMPRESSION:  1. A 4.5 x 3.5 mm saccular aneurysm of the right internal carotid      artery in the peri-ophthalmic by the angiogram performed June 21, 2008.  The aneurysm is unruptured and felt to be asymptomatic.  2. Chronic obstructive pulmonary disease.  3. Benign prostate hypertrophy.  4. History of prostate cancer, recently diagnosed.  5. Previous history of pneumonia.  6. History of methicillin-resistant Staphylococcus aureus urinary      tract infection.  7. History of depression.  8. Previous history of alcohol abuse.  9. Previous history of tobacco abuse.  10.Status post multiple surgeries.  11.History of multiple falls.  12.Gastroesophageal reflux disease.   PLAN:  As noted, the patient is admitted today for possible endovascular  treatment of an unruptured asymptomatic right internal carotid artery  periophthalmic region aneurysm to be performed by Sanjeev K. Deveshwar,  M.D. under general anesthesia.      Delton See, P.A.    ______________________________  Grandville Silos. Corliss Skains, M.D.    DR/MEDQ  D:  08/27/2008  T:  08/27/2008  Job:  [id number]   cc:   Ernestina Penna, M.D.   Veneta Penton  Elnour, MD

## 2010-12-08 NOTE — H&P (Signed)
NAMEARMON, Zachary NO.:  [id number]   MEDICAL RECORD NO.:  [id number]          PATIENT TYPE:  INP   LOCATION:                               FACILITY:  [facility]   PHYSICIAN:  Charlestine Massed, MDDATE OF BIRTH:  [birth year]   DATE OF ADMISSION:  11/22/2008  DATE OF DISCHARGE:  11/25/2008                              HISTORY & PHYSICAL   PRIMARY CARE PHYSICIAN:  Dr. Ernestina Penna at [facility]   PULMONOLOGY:  Dr. Shan Levans   CHIEF COMPLAINT:  Generalized weakness over the last 1 week.   HISTORY OF PRESENT ILLNESS:  [name] is a 77 year old gentleman  who lives by himself at his house in [place], [place], and who is  totally ADL and IADL independent, who was sent to the emergency room by  his primary physician as he has been having an issue of getting  generalized weakness which is worsening associated with new onset of  shortness of breath with mild exertion over the past few days.  The  patient states that he started feeling weak more than a week ago and  over the past week he has been getting gradually much weaker and weaker,  and now even with a short wall he is getting short of breath which is a  totally new issue for him.  He is known to have chronic obstructive  pulmonary disease and has been following up with Dr. Delford Field for this  issue.  He is not on home oxygen.  Currently, he was not home oxygen at  the last discharge.  He was recently treated with broad-spectrum  antibiotics for community-acquired pneumonia and discharged in February.   The patient denies any cough, any fever.  No nausea or vomiting,  diarrhea.  No sputum production.  No palpitations.  No loss of  consciousness.  He said over the past 2-3 days he has been feeling more  weak and he has been falling down.  He fell 2-3 times.  He denies any  injury to his head.  The fall, he states, is gradual weakness which  forced him to sit down.  There is no pain or tenderness in  his hip or  knee, and his gait is okay as per him.   The shortness of breath started appearing the last 3 days which is not  present at rest but it is present with mild to intermediate level of  extension.  There is no coughing.  There is no sputum production.   Last admission when he was admitted in February for shortness of breath  and same condition of weakness, he was diagnosed with pneumonia on the  left side.  At that time, also, he did not have any cough or sputum  production, but he had symptoms of generalized weakness at that time,  too.  No urinary symptoms.  No pain in the abdomen.   He was recently seen at [facility] in [place] for complaints of pain  in the right leg and he was found to be having cellulitis in the right  leg, and was given some antibiotics.  He was tested for DVT at that time  and, as per his daughter and him, it was negative at [facility].  We do not have any reports right here.  He still has some swelling and  pain in the right leg.   Past history significant for:  1. Severe centrilobular emphysema.  2. Prior history of community-acquired pneumonia in February on the      left side, treated.  3. History of prostate cancer being followed up by Dr. Earlene Plater, urology.  4. Gastroesophageal reflux disease.  5. History of depression.  6. History of intracranial aneurysm status post coiling on August 27, 2008, by Dr. Corliss Skains.   Medications are:  1. Advair 250/50 1 puff b.i.d.  2. Remeron 30 mg p.o. q.h.s.  3. Prilosec 20 mg p.o. daily.  4. Xanax 0.5 mg b.i.d. p.r.n.  5. Effexor XR 150 mg daily.  6. Vitamin B12 injections once monthly.   SOCIAL HISTORY:  He has prior history of smoking, currently not smoking  now.  No alcohol use.  No drug use.   ALLERGIES:  He is allergic to CONTRAST MEDIA.  No other drug allergies  reported.  No latex allergy.   FAMILY HISTORY:  There is no family history significant for coronary  artery disease  and the rest the patient cannot recollect.   REVIEW OF SYSTEMS:  CONSTITUTIONAL:  Generalized weakness.  No fever.  HEAD AND NECK:  No thrush.  No bleeding from the nose.  No other issues.  CHEST AND CARDIORESPIRATORY:  No cough, no fever, no sputum production.  Mild shortness of breath present.  No chest pain.  No palpitations.  Prior history of supraventricular tachycardia.  ABDOMEN, GI, AND GU:  History of prostate cancer.  No abdominal symptoms and no urinary  symptoms.  No nausea, vomiting, diarrhea.  No black stools.  EXTREMITIES:  The right side lower extremity diagnosed with cellulitis a  few days ago at [facility].  CNS: No neurological deficits.  PSYCH:  Stable.   A 12-point review of all systems done, positive pertinent features as  mentioned above and in the history of present illness, and is negative  otherwise.   GENERAL EXAMINATION:  Blood pressure 115/72, heart rate is 82,  respirations 16, temperature 97.2, O2 sat 96% on 2 L oxygen.  GENERAL EXAMINATION:  The patient is alert and oriented, well, afebrile,  not in any distress.  Answers questions clearly.  HEAD AND NECK EXAM:  Pupils reactive to light bilaterally.  Connective  are not pale.  No oral thrush.  No bleeding seen in oral or nasal  mucosa.  Neck is supple.  No JVD.  No bruit.  No nodes palpable.  CHEST:  Bilateral air entry is good anteriorly and posteriorly.  There  are coarse rales heard, more on the right side as well as on the left  side in the posterior aspect.  There is bilateral diffuse wheezing  present.  Vesicular breath sounds.  CARDIAC:  S1 and S2 heard, regular.  No murmurs heard.  ABDOMEN:  Soft, nontender.  No organomegaly.  No costovertebral angle  tenderness.  Bowel sounds positive.  EXTREMITIES:  Right leg is swollen than the left.  There is warmth to  palpation on the right leg and there is palpable pitting edema also  present.  The left leg has not swelling, no edema.  There are no  groin  lymph nodes  palpable.  CNS:  No motor or sensory deficits.  Speech and comprehension are  intact.  PSYCH:  Stable mental status.  MUSCULOSKELETAL:  Range of motion is complete in all joints checked.  No  effusions Seen.   LABS:  1. Chest x-ray on view showed patchy right lower lobe pneumonia      present.  There is no effusion.  No pneumothorax.  Chronic COPD      issue features present with hyperinflation.  2. BMP:  Sodium 137, potassium 4.0, chloride 104, bicarb 25, BUN 10,      creatinine 1.13, glucose 120, calcium 8.4, previous creatinine was      0.93.  3. LFTs:  Total bilirubin 0.2, total protein 6.7, albumin 3.0, AST 25,      ALT 17, alk phos 101.  4. Troponin I first set less than 0.05.  5. CBC:  WBC 11.4 with 74% neutrophils, hemoglobin 10.5.  The previous      hemoglobin on September 04, 2008. was 12.7 and hematocrit 30.9,      platelets 219,000.  6. UA negative for UTI, negative for hematuria.  7. ABG on room air is 7.8, pH 7.412, pCO2 34.7, pO2 is 44, O2 sat is      82% on room air.   ASSESSMENT:  1. Right lower lobe community-acquired pneumonia.  2. Sepsis.  3. History of COPD and current mild COPD exacerbation secondary to      pneumonia.  4. Normocytic anemia for evaluation with a drop of 2 g in the past 2      months.  5. Active renal failure possibly secondary to dehydration.  6. History of prostatic cancer.  7. History of supraventricular tachycardia, nonspecific of which type.   PLAN:  1. Admit the patient to telemetry floor in view of the prior history      of supraventricular tachycardia as well as presence of some      calculus in the lung.  We will observe the patient there, admit the      patient to [facility].  2. Vital signs q.4 h. and put the patient on nasal oxygen 2 liters per      minute by nasal cannula, increase oxygen as needed to keep 02 sats      more than 92%.  Regular diet with aspiration precautions.  3. For pulmonary, we  will send blood cultures x2, start azithromycin      and ceftriaxone for antibiotics for initial set for community-      acquired pneumonia.  We will keep the patient on nasal oxygen,      start Solu-Medrol 50 mg IV q.8 h.  If the patient still has mild      wheeze, we will follow the patient clinically daily.  If any      worsening is noted, the patient needs to be moved over to the step-      down unit for BiPAP placement if the O2 sats are not improving with      nasal oxygen.  4. Continue nebulizers, albuterol and Atrovent p.o. q.6 h. and MDI      albuterol q.2 h. p.r.n. for wheeze.  5. Anemia.  For evaluation, the patient has a drop of more than 2 g of      hemoglobin in the past 2 months which I believe is value taken on a      dehydrated person.  So, in fact, the exact hemoglobin will be  possibly below 10.  I will do a stool occult blood x3 sets, send an      anemia panel.  The patient has normocytic anemia but she was on      vitamin B12 supplements.  Still not sure of what exactly the cause      for the anemia will be as the patient has stated clearly that he      never had any black stools and no evidence of any blood in the      stools, and he had a colonoscopy within the last 3 years which      showed 1 polyp and there was no fungating mass otherwise to suggest      any colon cancer.  He needs to be evaluated further for the anemia      and we are not for providing any treatment for the patient at this      moment before identifying the reason for the anemia.  6. Acute renal failure.  The creatinine was 0.93, the last time came      up to 1.13, which I believe is due to dehydration.  He has been      already given some IV fluids.  In view of the fact that he has      prior echocardiogram showing diastolic dysfunction and he has      symptoms of shortness of breath, I will keep the dehydration to a      very gentle level at 75 mL/hr for just 12 hours.  If the creatinine       falls below 1, then we will stop the IV fluids and observe the      patient.  Chest x-ray has not shown any evidence of congestion at      this time and his air entry is good otherwise.  We will watch the      pulse oximetry continuously and will watch the input/output by      placing a condom catheter and repeat BMP in a.m. to evaluate the      dehydration.  7. History of prostate cancer.  He has seen Dr. Earlene Plater in the last 3      months and he has suggested no further treatment at this time.  We      will check a PSA level which is due as per the patient at this      time.  8. History of diastolic dysfunction.  Even though the patient has some      shortness of breath and has some hypoxia, it is not clear whether      it is exactly due to the cardiac etiology at this time and there is      no evidence of any fluid overload.  9. Cellulitis, right lower extremity.  He is currently being placed on      Rocephin and Zithromax but we need coverage for Streptococcus      viridans.  Also at this time, we will not place him on any further      antibiotics.  But, if there is no resolution seen within 1 day,      then we will plan to start Augmentin p.o. at that time.  10.History of supraventricular tachycardia.  We are not sure what      exact supraventricular tachycardia he had.  It was taken from Dr.      Lynelle Doctor previous dictation.  Currently, we will check a  12-lead      EKG and put him on telemetry, and observe him to see whether he has      any issues with paroxysmal atrial fibrillation or any other      tachycardia other that sets in.  He has been falling 3-4 times this      week and he will not be eligible for any anticoagulation in case      AFib has been found.  11.DVT prophylaxis.  Currently, we will place the patient on heparin      and there is no obvious source of bleed seen at this time even      though he is anemic.  12.For GI, we will give Protonix in view of the fact that  he is under      enormous stress and will be getting IV Solu-Medrol for management      at this time.   A total of 60 minutes was spent on admission, review of papers,  evaluation, and discussion with the patient.      Charlestine Massed, MD  Electronically Signed     UT/MEDQ  D:  11/22/2008  T:  11/22/2008  Job:  [id number]   cc:   Ernestina Penna, M.D.  Charlcie Cradle Delford Field, MD, FCCP

## 2010-12-08 NOTE — Assessment & Plan Note (Signed)
[facility]                             PULMONARY OFFICE NOTE   NAME:[name], [name]                          MRN:          [id number]  DATE:04/05/2007                            DOB:          1930-07-19    [name] is a 77 year old male, history of chronic obstructive lung  disease, primary emphysematous component. Overall the patient is  improved with decreased shortness of breath. Maintaining:  1. Advair 500/50 one spray b.i.d.  2. Nexium 40 mg daily.   PHYSICAL EXAMINATION:  Temperature 97, blood pressure 120/74, pulse 75,  saturation 94% on room air.  CHEST: Showed distant breath sounds with prolonged expiratory phase. No  wheeze or rhonchi noted.  CARDIAC EXAM: Showed a regular rate and rhythm without S3. Normal S1,  S2.  ABDOMEN: Soft, nontender.  EXTREMITIES: Showed no edema or clubbing.   CT scan obtained on March 28, 2007 was reviewed, showed scattered  areas of nodularity some representing scar and post inflammatory change,  micro-nodules seen in the lung parenchyma pattern compatible with  atypical infection such as MAI, Microbacterium avium complex and this is  not malignant in nature.   IMPRESSION:  That of chronic scarring likely granulomatous changes right  upper lobe, left upper lobe, stable at this time. Stable chronic  obstructive lung disease.   PLAN:  Maintain Advair 500/50 one spray b.i.d. and return in 6 months  for recheck. He should have a repeat CT scan and it is already scheduled  for December 2008.     Charlcie Cradle Delford Field, MD, [facility]  Electronically Signed    PEW/MedQ  DD: 04/05/2007  DT: 04/05/2007  Job #: [id number]   cc:   Ernestina Penna, M.D.

## 2010-12-08 NOTE — H&P (Signed)
NAMERAYLIN, WINER NO.:  [id number]   MEDICAL RECORD NO.:  [id number]          PATIENT TYPE:  OIB   LOCATION:  3107                         FACILITY:  [facility]   PHYSICIAN:  Acey Lav, MD  DATE OF BIRTH:  1930/05/02   DATE OF ADMISSION:  08/27/2008  DATE OF DISCHARGE:  08/28/2008                              HISTORY & PHYSICAL   CHIEF COMPLAINT:  Weakness with cough.   HISTORY OF PRESENT ILLNESS:  Mr. Mom is a 77 year old Caucasian  gentleman with a history of severe COPD, and recurrent pneumonias, and  recent discharge from [facility] on the third after admission  for stent-assisted coiling of his right internal carotid artery  periophthalmic aneurysm September 04, 2008.  Mr. Cacioppo, as mentioned, has  had a history of pneumonia on several occasions, most recently this past  September when he had a right upper and lower lobe pneumonia was managed  with antibiotics including avelox and improved.  He has had a prior  pneumonias in the left side.  A CT scan obtained in 2008, had shown a  left upper lobe nodule as well along with resolution of prior densities  in the medial aspect of the left apex and lower lobe lingula, and right  middle lobe.  Mr. Weissberg has felt increasingly worse over the last few  days.  He initially felt okay when he was discharge from the hospital.  However, over this past weekend, he was nauseated and vomited his food.  He continued to feel progressively worse and in the last 2 days has  barely left his bed due to feeling as if he was going to pass out.  He  has also had increasing cough, particularly at night.  He has produced  some yellow sputum as well.  He has not noted fevers.  He presented to  [facility] Emergency Department where he was found on chest x-ray to  have infiltrates in the left lower and left upper lobes along with some  chronic markings in the right lobe.  He additionally had a peripheral  leukocytosis  with white count 17,000.  The patient was placed on  vancomycin and Zosyn in the emergency department and the [facility] was asked to admit the patient to the hospital.  The patient had  noted severe reflux while in the hospital and his family states that he  had been oftentimes lying flat on his bed.   The patient denies diarrhea, abdominal pain, or vomiting besides the 1  episode I described.   PAST MEDICAL HISTORY:  1. The patient is status post coiling of his aneurysm as described      above.  He has severe COPD.  Is on Advair but apparently not on      albuterol.  He is not on oxygen at home.  2. Depression for which he takes mirtazapine  3. Anxiety for which he takes Xanax.  4. Acid reflux.   PAST SURGICAL HISTORY:  As described above.   FAMILY HISTORY:  Father died of lung cancer.  SOCIAL HISTORY:  The patient lives in a senior living community in his  own apartment.  He quit smoking in 2004 but has a 55 pack-year history.  He rarely uses alcohol and denies other recreational drug use.   REVIEW OF SYSTEMS:  As described above, otherwise 10-point review of  systems is negative.   CURRENT MEDICATIONS:  1. Plavix 75 mg daily.  2. Omeprazole 5 mg daily.  3. Mirtazapine 15 mg daily.  4. Aspirin 81 mg daily.  5. Advair Diskus 250/50 one puff 2 times daily.  6. Xanax 0.5 mg twice daily as needed for anxiety.   ALLERGIES:  No known drug allergies.   PHYSICAL EXAMINATION:  VITAL SIGNS:  Blood pressure was 131/74, pulse  88, respirations 20, pulse ox was 98% on 2L nasal cannula.  T-max of  98.4.  GENERAL:  Pleasant gentleman in no acute distress.  He is oriented to  person, to location, to the fact that it is Super Bowl Sunday.  He got  the month wrong and the date wrong.  HEENT:  Normocephalic, atraumatic.  Pupils are equal round and reactive  to light.  Sclerae anicteric.  Oropharynx is dry.  NECK:  Supple.  CARDIOVASCULAR:  Revealed regular rate and rhythm  without murmurs,  gallops or rubs.  LUNGS:  Had some egophony on the left side anteriorly and also  posteriorly.  ABDOMEN:  Soft, nondistended, nontender.  EXTREMITIES:  Without edema.   LABORATORY DATA:  Chest x-ray as described above.  EKG showed incomplete  right bundle branch block, T-wave inversion in aVL but otherwise no ST  or T-wave changes.   UA negative.  Cardiac markers negative.  CBC with differential white  count 17.5, hemoglobin 2.7, platelets are 238,000, ANC of 15.3.  Metabolic panel was pending.   ASSESSMENT/PLAN:  This is a 77 year old Caucasian gentleman with chronic  obstructive pulmonary disease, recurrent pneumonias in multiple lobes,  recently had aneurysm coil placed with stent guidance, discharged from  the hospital last week and now presents with cough and weakness after an  episode of vomiting, and now with multilobar pneumonia on chest x-ray.   PLAN:  1. Multilobar pneumonia:  Given that the patient was just discharge      from the hospital, he must be treated as a health-care-associated      pneumonia.  Therefore, we are treating with vancomycin and      piperacillin tazobactam.  We will check a urine Legionella antigen      as well and give him azithromycin.  I will also check a urine      pneumococcal antigen.  I suspect his pneumonia may also be due to      aspiration and, therefore, we will check a speech swallow study      performed.  He has had several episodes of aspiration.  We will      also check a CT scan of his chest given his recurrent pneumonias      and also prior history of a nodule of the left upper lobe and      extensive smoking history.  I am not suspicious for tuberculosis in      this patient given his recurrent symptoms that have improved with      antibiotics, and I think it is more likely that he aspirates.  2. Chronic obstructive pulmonary disease.  I will give him albuterol      nebulizer treatment.  Continue on inhaled  steroid.  3. Depression.  I will continue mirtazapine.  4. Anxiety.  I will continue Xanax.  5. Gastroesophageal reflux disease.  I will continue proton pump      inhibitor.  6. Prophylaxis.  Put on heparin 5000 t.i.d.  7. Aneurysm.  I will continue minus Plavix.  8. Code Status.  The patient is a Do Not Resuscitate, Do Not Intubate,      but would want pressors and cardioversion.  Discussed with the      family and the patient, and this is what they wish.      Acey Lav, MD  Electronically Signed     CV/MEDQ  D:  09/01/2008  T:  09/01/2008  Job:  [id number]   cc:   Charlcie Cradle. Delford Field, MD, FCCP  Sanjeev K. Corliss Skains, M.D.

## 2010-12-08 NOTE — Discharge Summary (Signed)
NAMEJUDGE, DUQUE                   ACCOUNT NO.:  [id number]   MEDICAL RECORD NO.:  [id number]          PATIENT TYPE:  INP   LOCATION:  6739                         FACILITY:  [facility]   PHYSICIAN:  Charlcie Cradle. Delford Field, MD, FCCPDATE OF BIRTH:  04-Apr-1930   DATE OF ADMISSION:  04/01/2008  DATE OF DISCHARGE:  04/02/2008                               DISCHARGE SUMMARY   DISCHARGE DIAGNOSES:  1. Right upper lobe community-acquired pneumonia.  2. Chronic obstructive pulmonary disease exacerbation.   HISTORY OF PRESENT ILLNESS:  See history of physical on the chart.   HOSPITAL COURSE:  The patient was admitted, treated with IV antibiotics,  had right upper lobe pneumonia.  He cleared this.  He was ready for  discharge by April 02, 2008.  He will be discharged on oral Avelox  for 7 days 400 mg daily.  His other discharge meds will be the same as  on admission.  He will return to see me in the office.  See Dr. Delford Field  in the office in 1 week.  He is discharged also on Advair 250/50 one  spray b.i.d., Effexor 75 mg daily, Prilosec daily, and Remeron nightly.  The patient is discharged in satisfactory condition and improved status.      Charlcie Cradle Delford Field, MD, [facility]  Electronically Signed     PEW/MEDQ  D:  05/10/2008  T:  05/11/2008  Job:  [id number]

## 2010-12-08 NOTE — Discharge Summary (Signed)
[name], [name]                   ACCOUNT NO.:  [id number]   MEDICAL RECORD NO.:  [id number]          PATIENT TYPE:  OIB   LOCATION:  3107                         FACILITY:  [facility]   PHYSICIAN:  Sanjeev K. Deveshwar, M.D.DATE OF BIRTH:  1929-09-27   DATE OF ADMISSION:  08/27/2008  DATE OF DISCHARGE:  08/28/2008                               DISCHARGE SUMMARY   CHIEF COMPLAINT:  Cerebral aneurysm.   HISTORY OF PRESENT ILLNESS:  This is a pleasant 77 year old male who was  admitted to [facility] on June 17, 2008, after falling and  hitting his head at home.  The patient was noted to be dehydrated and  had a urinary tract as well as possible sinus infection at time of  admission.  He did not have any significant memory of his fall.   During his stay at [facility], he had an MRI that revealed a  possible cerebral aneurysm.  A cerebral angiogram was performed by Dr.  Corliss Skains on June 21, 2008, that confirmed a 4.5 x 3.5 mm saccular  aneurysm of the right internal carotid artery in the periophthalmic  region.   The patient was seen in consultation by Dr. Corliss Skains on July 01, 2008, at that time he was accompanied by his daughter who is a Engineer, civil (consulting).  Cerebral aneurysms were discussed in detail as well as treatment options  including the risks and benefits.  The patient elected to proceed with  endovascular treatment of the aneurysm and was admitted to [facility] on August 27, 2008, for this procedure.   PAST MEDICAL HISTORY:  Significant for:  1. Severe COPD.  The patient quit smoking 5 years ago.  He did smoke      up to 2 pack of cigarettes per day for at least 50 years.  He has      seen Dr. Shan Levans in the past for Pulmonology.  2. The patient has a history of falls with an unsteady gait.  He has      had several recent falls prior to this admission.  3. He has BPH with early prostate cancer, being followed by Dr. Gaynelle Arabian.  4. The patient has a previous history of pneumonia.  5. He has a history of depression.  6. He has a previous history of alcohol abuse.   SURGICAL HISTORY:  The patient had a recent prostate biopsy.  He had a  previous knee surgery.  He denies any problems with anesthesia.   ALLERGIES:  No known drug allergies.   MEDICATIONS AT THE TIME OF ADMISSION:  Cephalexin, which was recently  started for a skin tear of his left upper extremity after a fall.  He is  also on Xanax.  He started Plavix 3 days prior to admission in  anticipation of possible stent placement.  He is on aspirin, omeprazole,  Advair, and he takes mirtazapine at bedtime as needed.   SOCIAL HISTORY:  The patient is married.  His wife has advanced dementia  and resides at a nursing  home.  He has 3 supportive children.  He lives  alone in [place].  He quit smoking 5 years ago.  He did smoke up to two  packs of cigarettes per day for 50 years.  He previously drank 6-8 beers  per day.  He states, now he has only an occasional drink.  He is a  retired Naval architect.   FAMILY HISTORY:  His mother died at age 58.  She had diabetes and  cardiovascular disease.  His father died at age 79.  I believe, he had  lung cancer.   HOSPITAL COURSE:  As noted, this patient was admitted to [facility] on August 27, 2008, to undergo endovascular treatment of a 4.5  x 3.5 mm saccular aneurysm of the right internal carotid artery in the  periophthalmic region.  On the day of admission, the patient underwent  stent-assisted coiling performed by Dr. Corliss Skains under general  anesthesia.  Please see Dr. Fatima Sanger dictated note for full details  of the procedure.   Following the intervention, the patient was admitted to the Neuro  Intensive Care Unit per Dr. Fatima Sanger protocol.  The patient remained  on IV heparin overnight.  The following day, the IV heparin was  discontinued and the right femoral groin sheath was removed.  The   patient is currently on bedrest for 6 hours.  At the end of 6 hours, he  will be ambulated.  If he remains medically stable, the plan is to  proceed with discharge in improved condition early this evening.   LABORATORIES ON THE MORNING OF DISCHARGE:  A mildly low potassium level  of 3.4, this will be supplemented prior to discharge.  He also had a  sodium of 134 and a glucose of 164.  CBC revealed a hemoglobin of 12.6,  hematocrit of 38.2, and WBCs were 9.7000, and platelets 244,000.  On  August 22, 2008, hemoglobin had been 13.4 and hematocrit 40.1.  It was  felt that some of this decrease in hemoglobin and hematocrit was due to  fluid hydration.   DISCHARGE MEDICATIONS:  The patient was told to resume the medications  that he was on prior to admission.  He was to remain on Plavix 75 mg  daily until discontinued by Dr. Corliss Skains.  He was also to remain on an  aspirin 81 mg daily.   The patient was told to stay on his regular diet.  He was given wound  care instructions.  He was told to avoid any strenuous activity of  driving for at least 2 weeks.  A cerebral angiogram will be repeated in  3-4 months to further evaluate the status of his aneurysm.  He will  follow up with Dr. Corliss Skains in the office in approximately 2 weeks.  He  will follow up with Dr. Rudi Heap as needed or as scheduled.   DISCHARGE DIAGNOSES:  1. Status post stent-assisted coiling of a right internal carotid      artery periophthalmic aneurysm performed, August 27, 2008, under      general anesthesia by Dr. Corliss Skains.  2. History of severe chronic obstructive pulmonary disease.  3. Remote history of tobacco use.  4. Unsteady gait with multiple falls.  5. Benign prostatic hypertrophy with early prostate cancer followed by      Dr. Gaynelle Arabian.  6. History of pneumonia.  7. History of depression.  8. Remote history of alcohol abuse.  9. Hypokalemia supplemented.  10.Mild anemia.  11.History of  methicillin-resistant Staphylococcus aureus urinary      tract infection during a previous admission.  12.Gastroesophageal reflux disease.      Delton See, P.A.    ______________________________  Grandville Silos. Corliss Skains, M.D.    DR/MEDQ  D:  08/28/2008  T:  08/29/2008  Job:  [id number]   cc:   Ernestina Penna, M.D.  Beckey Rutter, MD

## 2010-12-08 NOTE — Consult Note (Signed)
NAMECHRISTINO, [name]                   ACCOUNT NO.:  [id number]   MEDICAL RECORD NO.:  [id number]          PATIENT TYPE:  OUT   LOCATION:  XRAY                         FACILITY:  [facility]   PHYSICIAN:  Sanjeev K. Deveshwar, M.D.DATE OF BIRTH:  1929/11/21   DATE OF CONSULTATION:  09/10/2008  DATE OF DISCHARGE:                                 CONSULTATION   CHIEF COMPLAINT:  Status post stent-assisted coiling of a right internal  carotid artery in the periophthalmic area performed on August 27, 2008.   BRIEF HISTORY:  This is a very pleasant 77 year old male who was  admitted to [facility] in November after falling and hitting  his head at home.  He was found to have a cerebral aneurysm by MRI and  angiogram was performed that confirmed the aneurysm which measured 4.5 x  3.5 mm and was in the right internal carotid artery in the  periophthalmic region.  The patient was seen in consultation by Dr.  Corliss Skains.  The treatment options were discussed.  The patient and his  family elected to have endovascular treatment and he was admitted to  [facility] on August 27, 2008 for that intervention.  He  returns today to be seen in followup.   PAST MEDICAL HISTORY:  Significant for:  1. Severe COPD.  He did quit smoking 5 years ago.  He is followed by      Dr. Shan Levans.  Apparently, the patient was admitted to [facility] on September 01, 2008 to September 05, 2008 with      pneumonia.  He is currently finishing up a course of Avelox.  2. The patient also has a history of falls with an unsteady gait.  He      reports that he has had no recent falls.  3. He has BPH with early prostate cancer followed by Dr. Gaynelle Arabian.  4. He has a history of depression.  5. A remote history of alcohol abuse.   SURGICAL HISTORY:  The patient had a prostate biopsy as well as knee  surgery.  He denies any previous problems with anesthesia.   ALLERGIES:  No known drug  allergies.   MEDICATIONS:  1. The patient had been placed on Plavix 75 mg daily following his      cerebral stent placement.  He is still taking the Plavix along with      aspirin 81 mg daily.  2. He is also finishing up a course of Avelox.  3. He is now on Mucinex, otherwise, he reports that his medications      have not changed.   From his discharge summary dated September 05, 2008 his medications also  included:  1. Omeprazole 20 mg daily.  2. Advair Diskus 1 puff twice daily.  3. Xanax 0.5 mg p.r.n. twice daily.  4. Mirtazapine 15 mg at bedtime.  5. Prednisone which was a tapering course at the time of discharge.   SOCIAL HISTORY:  The patient recently lost his wife.  She had advanced  dementia  and was in a nursing home.  Apparently, she passed away on  08/31/08.  They were married for over 50 years.  He has three  supportive daughters.  The patient currently lives alone in [place].  As  noted, he did quit smoking more than 5 years ago.  He did smoke up to  two packs of cigarettes per day for 50 years.  He previously drank beer  on a regular basis.  He is a retired Naval architect.   FAMILY HISTORY:  His mother died at age 49.  She had diabetes and  cardiovascular disease.  His father died at age 75.  I believe he had  lung cancer.   ASSESSMENT/PLAN:  As noted, the patient returns today following his  stent-assisted coiling for a intracranial unruptured cerebral aneurysm  performed on August 27, 2008.  Dr. Corliss Skains did review the results of  the study with the patient.  He showed images from before and after the  treatment of the aneurysm.   The patient reports no neurologic symptoms.  He has not had any further  falls.  Overall, he feels he is doing well although he is still grieving  as would be expected over the recent loss of his wife.   Dr. Corliss Skains recommended that the patient increase his aspirin from 81  mg per day to 325 mg per day.  He is to change his  Plavix to 75 mg every  other day and continue until all of his current prescription is  completed.  At that time, he will stop the Plavix but continue on the  aspirin.  We have scheduled him for a repeat cerebral angiogram in  approximately 3 months.  All of his questions were answered.  Greater  than 15 minutes was spent on this followup visit.      Delton See, P.A.    ______________________________  Grandville Silos. Corliss Skains, M.D.    DR/MEDQ  D:  09/10/2008  T:  09/11/2008  Job:  [id number]   cc:   Beckey Rutter, MD  Ernestina Penna, M.D.  Charlcie Cradle Delford Field, MD, FCCP

## 2010-12-08 NOTE — H&P (Signed)
[name], [name] NO.:  [id number]   MEDICAL RECORD NO.:  [id number]          PATIENT TYPE:  INP   LOCATION:  1502                         FACILITY:  [facility]   PHYSICIAN:  Vania Rea, M.D. DATE OF BIRTH:  1929/09/12   DATE OF ADMISSION:  06/17/2008  DATE OF DISCHARGE:                              HISTORY & PHYSICAL   Previous medical record number with most of his records is [id number].   PRIMARY CARE PHYSICIAN:  Dr. Rudi Heap at [facility].   GASTROENTEROLOGIST:  Dr. Danise Edge.   UROLOGIST:  Dr. Earlene Plater.   PULMONOLOGIST:  Dr. Shan Levans.   CHIEF COMPLAINT:  History of a fall 48 hours ago and progressively  unsteady gait since then.   HISTORY OF PRESENT ILLNESS:  This is a 77 year old Caucasian gentleman  with a history of COPD and benign prostatic hypertrophy status post  biopsy of his prostate 6 days ago and a recent incomplete colonoscopy  who was otherwise in fair health until about midnight on Saturday night  when he got up to use the bathroom and by his estimate slipped on a rug  and fell and hit his head.  Did not lose consciousness, but was very  dizzy and could not stand and walk for quite awhile.  Could only crawl  around.  He seemed much better by the following day, was walking but was  somewhat unsteady, but because of increasing unsteady gait, his family  persuaded him to come to the emergency room this evening for checkup.  The patient was evaluated in the emergency room.  Per the emergency room  physician, the patient had a broad-based ataxic gait.  The hospitalist  service was called to assist with management.   PAST MEDICAL HISTORY:  1. COPD.  2. Benign prostatic hypertrophy with fluctuating PSA levels.  3. Status post prostate biopsy 5 days ago.  4. History of pneumonia last month, treated with Avelox.  5. History of recent incomplete colonoscopy because of inadequate      prep.  6. History of  depression.   MEDICATIONS:  1. Effexorl 150 mg daily.  2. Remeron 15 mg at bedtime.  3. Advair 250/50 one puff twice daily.  4. Xanax 0.5 mg, he rarely takes.   ALLERGIES:  NO KNOWN DRUG ALLERGIES.   SOCIAL HISTORY:  He discontinued tobacco use over 5 years ago, but prior  to that, smoked two packs per day for over 50 years.  He drinks alcohol  in binges, sometimes goes a month without drinking but when he drinks  can drink 6-8 beers in one day.  No illicit drug use.  He is a retired  [employer].   FAMILY HISTORY:  Significant for diabetes and heart disease.  His father  died of lung and brain cancer at age 23.   REVIEW OF SYSTEMS:  Other than noted above, a 10-point review of systems  is significant only for his unsteadiness which talking to his family may  be acute and chronic rather than simply acute and symptoms of prostatism  such as hesitancy and frequency.  The patient also has dyspnea on  exertion climbing stairs, but denies any chest discomfort.  Denies any  pain or swelling in the legs, thighs, palpitations, dizziness or  diaphoresis.   PHYSICAL EXAMINATION:  GENERAL:  A very pleasant and jovial Caucasian  gentleman reclining in the stretcher.  He is alert and oriented x3.  VITALS:  Temperature is 98.5, pulse 90, respiration 18, blood pressure  139/84.  He is saturating at 96% on room air.  His pupils are round and  equal.  Mucous membranes pink and anicteric.  He has right periorbital  bruising.  No cervical lymphadenopathy or thyromegaly.  He does have  prominent hypertrophy of the parotid glands.  No thyromegaly or jugular  venous distention.  CHEST:  He has diffuse moderate rhonchi throughout.  CARDIOVASCULAR:  Tachycardiac.  No murmur heard.  ABDOMEN:  Obese, soft.  There is no tenderness.  No masses.  EXTREMITIES:  He has prominent veins but no edema.  His musculature is  well developed.  He has 3+ bounding dorsalis pedis pulses bilaterally.  CENTRAL NERVOUS  SYSTEM: Cranial nerves II-XII are grossly intact, and  his strength and sensation is equal and intact bilaterally.   LABORATORY DATA:  White count is elevated at 11.6 and his absolute  granulocyte count is elevated to 10.1.  Platelet count is 177, his  hemoglobin is 13.7, otherwise unremarkable.  Sodium is low at 138,  potassium 3.8, chloride 97, CO2 26, glucose 129, BUN 11, creatinine  1.15, calcium 9.0.  Liver function tests significant for AST elevated at  195 and ALT normal at 40, alk phos is normal at 63, total bilirubin  normal.  His urinalysis shows white cells too numerous to count, many  bacteria and nitrite positive, leukocyte esterase moderate.  CT scan of  the brain shows irregular soft tissue density within the subcutaneous  soft tissues over the posterior neck.  No acute hemorrhage.  No acute  infarct.  No mass lesion.  No skull fracture.  CT maxillofacial shows a  right maxillary sinusitis.  No fractures.  His EKG shows sinus rhythm  with incomplete right bundle branch block, but no ST wave changes  specific per sheet.   ASSESSMENT:  1. Unsteady gait status post fall with no evidence of acute bleed.      Differential includes a concussion syndrome or he may have some      intracranial abnormality which precipitated the fall.  2. Urinary tract infection with a history of BPH and recent biopsy.  3. COPD with marked dyspnea on exertion and active bronchospasm.  4. Hyperglycemia.  5. Hyponatremia.  6. Abnormal liver function tests, a gentleman with binge drinking.   PLAN:  Will admit this gentleman for hydration, treatments of his  urinary tract infection.  Will start cultures.  Will arrange an MRI.  He  gives a history of claustrophobia in an MRI machine.  Will write anxiety  prep prior to the MRI.  Will check his hemoglobin A1c and will also  check an RPR.  Other plans as per orders.  If everything checks out  okay, he can follow up with the neurologist as an outpatient.   Other  plans as per orders.      Vania Rea, M.D.  Electronically Signed     LC/MEDQ  D:  06/17/2008  T:  06/18/2008  Job:  [id number]   cc:   Charlcie Cradle. Delford Field, MD, FCCP  Ronald L. Earlene Plater,  M.D.  Danise Edge, M.D.

## 2010-12-08 NOTE — Discharge Summary (Signed)
[name], [name] NO.:  [id number]   MEDICAL RECORD NO.:  [id number]          PATIENT TYPE:  INP   LOCATION:  1502                         FACILITY:  [facility]   PHYSICIAN:  Beckey Rutter, MD  DATE OF BIRTH:  May 03, 1930   DATE OF ADMISSION:  06/17/2008  DATE OF DISCHARGE:  06/22/2008                               DISCHARGE SUMMARY   PRIMARY CARE PHYSICIAN:  Dr. Rudi Heap with [facility].   BRIEF HOSPITAL COURSE:  Mr. Lamadrid is a 77 year old pleasant Caucasian  male with past medical history significant for prostatic hypertrophy  with elevated PSA, COPD, status post prostate biopsy 5 days ago, recent  pneumonia treated with Avelox, history of depression, and maybe some  anxiety admitted because of falls and progressive unsteady gait together  with dizziness.  During the hospital course the patient was worked up  for reason of dizziness, and the workup is essentially negative.  The  patient also received meclizine p.r.n. although for the last 72 hours  the patient has not been dizzy.  His gait is also back to normal, and he  has not any complaint currently.   The patient was found to have right periophthalmic aneurysm that was  rechecked and reevaluated by brain angiogram yesterday.  The results  were showing aneurysm as indicated, and the options of management will  be discussed with Dr. Elder Love on outpatient basis.  The family  including the son and daughter as well as the patient himself are  agreeable to this plan, and that will be the discharge plan.   HOSPITAL PROCEDURES:  1. Radiology.  The patient had CT head without contrast on the night      of admission, 23rd of November 2009.  Impression was no acute      facial fracture identified and mild maxillary sinusitis.  2. The patient had CT head with a finding showing no acute      intracranial findings.  Please see maxillofacial CT report.  3. On the 24th of November  2009 the patient had MRI and MRA; the      finding is showing 3.8 mm right periophthalmic region aneurysm and      mild intracranial atherosclerotic type changes.  The MRI is      negative for acute infarct with no abnormal intracranial enhancing      lesions.  Prominent small vessel disease type change.  4. The patient had drain angiogram done by Dr. Corliss Skains, and the      preliminary report is showing 4.5 mm x 3.5 mm right periophthalmic      aneurysm.  The remainder of the intracranial vessels are within      normal.   LABORATORY DATA:  His lab test today showing BNP with sodium of 139,  potassium 4.5, chloride 108, bicarb 23, glucose 100, BUN 10, and  creatinine 1.0.  White blood count 7.2, hemoglobin is 12.9, hematocrit  is 38.2.   DISCHARGE DIAGNOSES:  1. Right maxillary sinusitis.  2. Urinary tract infection with Escherichia coli.  3.  Dizziness resolved felt secondary to sinusitis versus urinary tract      infection.  4. Depression.  5. A 4.5 x 3.8 aneurysm in the periophthalmic area.   Please note the sensitivity of the Escherichia coli culture is showing  resistant to ampicillin, resistant to cephalosporin, resistant to  ceftriaxone, resistant to ciprofloxacin, resistant to levofloxacin,  resistant to tobramycin.  The Escherichia coli is imipenem, sensitive to  Septra, sensitive to nitrofurantoin, and sensitive to gentamicin.  The  patient will be discharged with Septra for 5 days.   The patient received Cipro and Rocephin management while in the  hospital.  The E. coli up above is confirmed to have extended-spectrum  beta-lactamase production, ESBL.   DISCHARGE MEDICATIONS:  1. Remeron 15 mg p.o. h.s.  2. Advair 250/5.  3. Effexor 150 mg daily.  4. Xanax 0.5 mg p.o. daily.  5. Meclizine 25 mg p.o. q.12 p.r.n. for dizziness.  6. Septra 80/400 p.o. b.i.d. for 5 more days for urinary tract      infection.   DISCHARGE/PLAN:  The patient is stable for discharge today.   The  dizziness and unsteady gait were felt secondary to sinusitis versus  urinary tract infection and now is recovered.  Nevertheless, he will be  continued on meclizine.  The patient will follow up with Dr. Elder Love to address the management and the option for managing this  periophthalmic  aneurysm.  The patient will have a follow-up with Dr.  Rudi Heap in 1-2 weeks maximum.  Prescriptions for meclizine, Septra,  and Xanax were given.      Beckey Rutter, MD  Electronically Signed     EME/MEDQ  D:  06/22/2008  T:  06/22/2008  Job:  [id number]   cc:   Ernestina Penna, M.D.  Fax: [phone]

## 2010-12-08 NOTE — Discharge Summary (Signed)
[name], [name]                   ACCOUNT NO.:  [id number]   MEDICAL RECORD NO.:  [id number]          PATIENT TYPE:  INP   LOCATION:  4702                         FACILITY:  [facility]   PHYSICIAN:  Isidor Holts, M.D.  DATE OF BIRTH:  December 25, 1929   DATE OF ADMISSION:  09/01/2008  DATE OF DISCHARGE:  09/05/2008                               DISCHARGE SUMMARY   PRIMARY PULMONOLOGIST:  Dr. Charlcie Cradle. Delford Field.   DISCHARGE DIAGNOSES:  1. Left lung community-acquired pneumonia.  2. Severe centrilobar emphysema.  3. History of depression.  4. Gastroesophageal reflux disease.  5. History of prostate carcinoma.  6. Intracranial aneurysm, status post coiling on 08/27/2008, by      interventional radiologist.   DISCHARGE MEDICATIONS:  1. Plavix 75 mg p.o. daily.  2. Omeprazole 20 mg p.o. daily.  3. Aspirin 81 mg p.o. daily.  4. Advair discus 250/50 mg, one puff twice daily.  5. Xanax 0.5 mg p.r.n. twice daily.  6. Mirtazapine 15 mg p.o. q.h.s.  7. Avelox 400 mg p.o. daily for seven days only.  8. Mucinex 600 mg p.o. twice daily for seven days only.  9. Prednisone 40 mg p.o. daily for three days, then 30 mg p.o. daily      for three days, then 20 mg p.o. daily for three days, then 10 mg      p.o. daily for three days, then stop.   PROCEDURE:  1. Head CT scan dated 09/01/2008:  No definite acute finding, through      there is minimal right occipital cortical hyperdensity noted which      is age-indeterminate and does not have the appearance of vascular      distribution territory infarction.  2. Chest x-ray dated 09/01/2008:  Showed left lower lobe, more than      left upper lobe pneumonia.  3. Chest CT scan dated 09/02/2008:  Showed interstitial and      patchy/nodular opacities in the left lung, suspicious for      pneumonia.  Old sternal right rib fractures.  Severe emphysema,      scattered, smaller at this time are noted.  Mild stranding in the      AP window, likely reactive due to  the pneumonia.  Several hypo-      dense lesions, appear similar to the prior CT from 06/26/2007,      probably represents cysts.  4. Modified barium swallow examination on 09/04/2008:  This showed a      functional swallow. Aspiration of thin liquids into piriformis per      swallow trachea with ejection from larynx upon swallow completion,      but silent aspiration of thins on attempt to swallow 8.13 mm pill.      Esophageal sweep revealed no mid-esophageal stasis during this      limited exam and adequate passage of pill, prominent      cricopharyngeus noted which resulted in minimal residual.      Recommended regular diet with thin liquids.  5. A 2-D echocardiogram dated 09/04/2008:  Showed overall left  ventricular systolic function was normal.  Ejection fraction 60% to      65%.  No diagnostic left ventricular regional wall motion      abnormality identified.  Left ventricular wall thickness mildly      increased.  Features consistent with mild diastolic dysfunction.      There was trivial aortic valve regurgitation.  Mean transaortic      valve gradient 6.9 mmHg.  Aortic root normal in size.  There was      mild mitral annular calcification.  Estimated peak pulmonary      systolic pressure was mildly increased.   CONSULTATIONS:  Dr. Shan Levans, pulmonologist.   ADMISSION HISTORY:  This is a 77 year old male, with known history of  severe central lobar emphysema on regular followup with Dr. Delford Field,  pulmonologist, depression, gastroesophageal reflux disease, history of  prostate cancer, history of intracranial aneurysm, status post coiling  on 08/27/2008, by Dr. Corliss Skains, interventional radiologist, presenting  with increased dyspnea, and a productive cough. On initial evaluation in  the emergency department, his chest x-ray confirmed left lung pneumonia.  His WBC's were elevated at 17.5.  He was admitted for further  evaluation, investigation and management.   #1 -   Community-Acquired Pneumonia:  For the details of presentation,  refer to the admission history above.  The patient was commenced on  broad-spectrum antibiotic coverage, including Zosyn, Vancomycin and  Azithromicin.  Reportedly, he had been in a nursing home recently  visiting his spouse, who had expired in late January 2010.  He was  managed in addition, with bronchodilator nebulizers and oxygen  supplementation.  Clinically he did well.  He had no recorded episodes  of pyrexia during this hospitalization.  His physical well being  gradually improved.  His white cell count which was 17.5 at  presentation, had dropped on 09/04/2008, and normalized at 3.4.  This  was day #4 of the above-mentioned antibiotics.  The patient was  therefore transitioned to oral Avelox for a further seven-day course,  starting on 09/05/2008.   #2 - Chronic Obstructive Pulmonary Disease Exacerbation:  This was  occasioned by #1 above.  The patient was managed with bronchodilator  nebulizers.  He was also commenced on oral Prednisone in a tapering  course.   #3 - History of Depression:  The patient's mood remained stable during  the course of this hospitalization.   #4 - Gastroesophageal Reflux Disease:  There were no problems  referrrable to this. patient was managed with proton pump inhibitor.   #5 - History of Prostate Carcinoma:  The patient exhibited no symptoms  of prostatism during the course of this hospitalization.   #6 - History of Intra-Cranial Aneurysm:  The patient states he is status  post-coiling on 08/27/2008, by Dr. Corliss Skains.  He had a CT scan on  09/01/2008, showing no concerning findings.   DISPOSITION:  The patient was seen on 09/04/2008, by Dr. Shan Levans,  who was invited on consultation, given the patient's underlying severe  chronic obstructive pulmonary disease, as well as chest CT scan  findings.  For details of that consultation, refer to the consultation  note of  09/04/2008.  He concurred with the current management, although  he did make minor adjustments.  Of note, the patient did undergo a  modified barium swallow examination on 09/04/2008, to rule out the  possibility of aspiration being contributory to his presenting  pneumonia.  For details of that procedure, see above.  The patient has  been recommended a regular diet with thin liquids.  The patient was  considered stable for discharge on 09/05/2008.  He was therefore  discharged accordingly.   DISCHARGE DIET:  No restrictions, thin liquids.   DISCHARGE ACTIVITIES:  As tolerated, otherwise per PT/OT.   FOLLOW-UP INSTRUCTIONS:  The patient is to follow up with his primary  pulmonologist, Dr. Shan Levans, on a date to be determined.  He has  been instructed to call for an appointment.   DISCHARGE INSTRUCTIONS:  Home health PT/OT has been arranged to improve  the patient's effort tolerance.      Isidor Holts, M.D.  Electronically Signed     CO/MEDQ  D:  09/05/2008  T:  09/05/2008  Job:  [id number]   cc:   Charlcie Cradle. Delford Field, MD, FCCP

## 2010-12-11 NOTE — Op Note (Signed)
   NAME:  [name], [name]                             ACCOUNT NO.:  [id number]   MEDICAL RECORD NO.:  [id number]                   PATIENT TYPE:  AMB   LOCATION:  ENDO                                 FACILITY:  [facility]   PHYSICIAN:  Danise Edge, MD                  DATE OF BIRTH:  February 03, 1930   DATE OF PROCEDURE:  04/09/2002  DATE OF DISCHARGE:                                 OPERATIVE REPORT   PROCEDURE:  Colonoscopy and polypectomy.   REFERRED BY:  Ernestina Penna, M.D., and L. Lupe Carney, M.D.   INDICATIONS:  The patient is a 77 year old male born 08/29/2029.  The patient  underwent his last colonoscopy approximately 5-1/2 years ago, and small  adenomatous polyps were removed from the left colon.  The patient is due for  a repeat screening colonoscopy with polypectomy to prevent colon cancer.   ENDOSCOPIST:  Danise Edge, M.D.   PREMEDICATION:  Versed 5 mg, Demerol 50 mg.   ENDOSCOPE:  Olympus pediatric colonoscope.   DESCRIPTION OF PROCEDURE:  After obtaining informed consent, the patient was  placed in the left lateral decubitus position.  I administered intravenous  Demerol and intravenous Versed to achieve conscious sedation for the  procedure.  The patient's blood pressure, oxygen saturation, and cardiac  rhythm were monitored throughout the procedure and documented in the medical  record.   Anal inspection was normal.  Digital rectal exam was normal.  The Olympus  pediatric video colonoscope was introduced into the rectum and easily  advanced to the cecum.  Colonic preparation for the exam today was  satisfactory.   Rectum normal.   Sigmoid colon and descending colon:  Extensive left colonic diverticulosis.   Splenic flexure normal.   Transverse colon:  From the proximal transverse colon a 1 mm sessile polyp  was removed with the hot biopsy forceps.   Hepatic flexure normal.   Ascending colon normal.   Cecum and ileocecal valve normal.    ASSESSMENT:  1.  Extensive left colonic diverticulosis.  2. From the proximal transverse colon, a 1 mm sessile polyp was removed with     the hot biopsy forceps.   RECOMMENDATIONS:  Repeat colonoscopy in five years.                                                 Danise Edge, MD    MJ/MEDQ  D:  04/09/2002  T:  04/09/2002  Job:  [id number]   cc:   Ernestina Penna, M.D.  [address] [place]  [place] [address]  Fax: [phone]   L. Lupe Carney, M.D.

## 2010-12-11 NOTE — Op Note (Signed)
NAME:  [name], [name]                             ACCOUNT NO.:  [id number]   MEDICAL RECORD NO.:  [id number]                   PATIENT TYPE:  AMB   LOCATION:  DSC                                  FACILITY:  [facility]   PHYSICIAN:  Feliberto Gottron. Turner Daniels, M.D.                DATE OF BIRTH:  19-Dec-1929   DATE OF PROCEDURE:  05/09/2002  DATE OF DISCHARGE:                                 OPERATIVE REPORT   PREOPERATIVE DIAGNOSIS:  Left shoulder impingement syndrome, possible  acromioclavicular joint arthritis, possible labral tear.   POSTOPERATIVE DIAGNOSIS:  Left shoulder impingement syndrome with  subacromial spur, small subclavicular spur, and partial labral tear.   PROCEDURES:  1. Left shoulder arthroscopic anterior inferior acromioplasty.  2. Distal clavicular inferior spur excision.  3. Debridement of labral tear.   SURGEON:  Feliberto Gottron. Turner Daniels, M.D.   ASSISTANTLaural Benes. Jannet Mantis.   ANESTHESIA:  General endotracheal.   ESTIMATED BLOOD LOSS:  Minimal.   FLUIDS REPLACED:  1 L crystalloid.   DRAINS:  None.   TOURNIQUET TIME:  None.   INDICATION FOR PROCEDURE:  A 77 year old man who sustained a minimally-  displaced proximal humerus fracture of the greater tuberosity and humeral  neck five or six months ago and has some residual impingement and pain and  has failed conservative treatment with anti-inflammatory medicine and  physical therapy.  He got about 60 or 70% pain relief with a cortisone  injection into the subacromial space a few months ago and now desires  elective decompression of his left shoulder.   DESCRIPTION OF PROCEDURE:  Patient identified by arm band and taken to the  operating room at [facility] day surgery center.  Appropriate anesthetic monitor  attached and general endotracheal anesthesia induced with the patient in the  supine position.  He was then placed into the beach chair position and the  left upper extremity prepped and draped in the usual sterile fashion  from  the wrist to the hemithorax.  The skin along the anterior, lateral, and  posterior aspects of the acromion process was infiltrated with 2-3 cc of  0.5% Marcaine and epinephrine solution and then standard portals were made  1.5 cm anterior to the [facility] joint, lateral to the junction of the posterior  and middle thirds of the acromion and posterior to the posterolateral corner  of the acromion process.  The inflow was placed anteriorly, the arthroscope  laterally, and a 4.2 [place] sucker shaver posteriorly, allowing a  subacromial bursectomy and outlining of a subacromial spur, which was then  removed with a 4.5 mm hooded Vortex bur, revealing the AC joint, which did  have some arthritic changes and an anterior distal subclavicular spur, which  was also removed with a bur.  The rotator cuff was then tacked externally as  we swept around the greater tuberosity with the 4.2 [employer]  sucker  shaver.  We then repositioned the arthroscope into the glenohumeral joint  from the posterior portal and identified an inflamed labrum that was  debrided back to a stable margin with 4.2 [place] sucker shaver.  The  biceps anchor and tendon were intact, as was the rotator cuff,  supraspinatus, infraspinatus, and subscapularis insertions.  At this point  the shoulder was washed out with normal saline solution.  The articular  cartilage was noted to be in good condition except for some focal grade 2  chondromalacia of the glenoid in the center.  The arthroscopic instruments  were removed.  A dressing of Xeroform, 4 x 4 dressing sponges, and paper  tape applied.  The patient was placed in a sling, awakened, and taken to the  recovery room without difficulty.                                                Feliberto Gottron. Turner Daniels, M.D.    Ovid Curd  D:  05/09/2002  T:  05/10/2002  Job:  [id number]

## 2010-12-11 NOTE — Assessment & Plan Note (Signed)
[facility]                             PULMONARY OFFICE NOTE   NAME:[name], [name]                          MRN:          [id number]  DATE:09/30/2006                            DOB:          11/01/1929    CHIEF COMPLAINT:  Evaluate COPD.   HISTORY OF PRESENT ILLNESS:  A 77 year old white male who has had a  longstanding history of chronic obstructive lung disease diagnosed 4  years ago.  He smoked two packs a day for 50 years but quit smoking in  2004.  He was followed by Dr. Lowanda Foster prior to his retirement and  untimely death.  The patient has been on Advair 250/50 one spray b.i.d.  for 2-year-interval time.  Last saw Dr. Lowanda Foster in November 2007.  He  has been relatively stable although noting increased shortness of breath  with exertion but typically worse up an incline.  He is not short of  breath at rest.  He does have minimal mucus production.  It is mostly  clear.  He has no chest pain, occasional acid symptoms which are  controlled well on Prilosec.  He has no loss of appetite or change in  weight.  There is no difficulty swallowing, no sore throat.  No  headaches, nasal congestion, sneezing, itching, earache, anxiety, hand  or foot swelling.  The patient is referred now to reestablish here for  pulmonary followup.   PAST MEDICAL HISTORY:  Medical:  No history of hypertension, heart  disease, heart dysrhythmias, asthma, diabetes, hypercholesterolemia,  stroke, blood clots, cancer, kidney disease, sleep disorders or  disorders of the central nervous system.  He had an operative  replacement of the right knee in 1998; no other operations noted.   MEDICATION ALLERGIES:  None.   CURRENT MEDICATIONS:  1. Advair 250/50 one spray b.i.d.  2. Zoloft 50 mg daily.  3. Remeron 10 mg daily.  4. Prilosec 20 mg daily.   SOCIAL HISTORY:  Smoking history as noted above.  Lives at home by  himself.  His spouse is in a nursing home.  He is retired as a  Scientist, research (physical sciences).   FAMILY HISTORY:  Emphysema in a brother, cancer in his father of the  lung.   REVIEW OF SYSTEMS:  Otherwise noncontributory.   PHYSICAL EXAMINATION:  GENERAL:  This is a thin, elderly male in no  distress.  VITAL SIGNS:  Temperature 98, blood pressure 136/74, pulse 67,  saturation 95% in room air.  CHEST:  Showed distant breath sounds with prolonged expiratory phase.  No wheeze, rale or rhonchi.  CARDIAC:  Showed a regular rate and rhythm without S3; normal S1, S2.  ABDOMEN:  Soft, nontender.  EXTREMITIES:  Showed no edema, clubbing, or venous disease.  SKIN:  Clear.  NEUROLOGIC:  Intact.  HEENT:  Showed no jugular venous distention or lymphadenopathy.  Oropharynx clear, neck supple.   LABORATORY DATA:  Pulmonary functions have been obtained already and  were reviewed per Dr. Levander Campion office from August 2007 showing an FEV1  of 68% predicted, FVC of 88% predicted  FEV1-FVC ratio of 79% predicted.  Chest x-ray showed COPD changes without acute process, stable nodular  density right apex, right middle lobe atelectasis, mild.   IMPRESSION:  That of chronic obstructive lung disease with increased  flare, increased lower airway inflammation.   RECOMMENDATIONS:  Increase Advair to 500/50 one spray b.i.d.  Maintain  albuterol on an as-needed basis.  We refilled all of his inhaled  medications and will see this patient back in followup in 6 weeks with  repeat pulmonary function study.     Charlcie Cradle Delford Field, MD, [facility]  Electronically Signed    PEW/MedQ  DD: 09/30/2006  DT: 09/30/2006  Job #: [id number]

## 2011-02-22 ENCOUNTER — Ambulatory Visit: Payer: Medicare Other | Admitting: Critical Care Medicine

## 2011-03-03 ENCOUNTER — Inpatient Hospital Stay: Payer: Medicare Other | Admitting: Gastroenterology

## 2011-03-15 ENCOUNTER — Ambulatory Visit: Payer: Medicare Other | Admitting: Critical Care Medicine

## 2011-04-14 ENCOUNTER — Ambulatory Visit: Payer: Medicare Other | Admitting: Gastroenterology

## 2011-04-15 ENCOUNTER — Emergency Department (HOSPITAL_COMMUNITY): Payer: Medicare Other

## 2011-04-15 ENCOUNTER — Other Ambulatory Visit (HOSPITAL_COMMUNITY): Payer: Medicare Other

## 2011-04-15 ENCOUNTER — Inpatient Hospital Stay (HOSPITAL_COMMUNITY)
Admission: EM | Admit: 2011-04-15 | Discharge: 2011-04-21 | DRG: 065 | Disposition: A | Discharge status: Rehabilitation Facility | Payer: Medicare Other | Attending: Neurology | Admitting: Neurology

## 2011-04-15 DIAGNOSIS — K219 Gastro-esophageal reflux disease without esophagitis: Secondary | ICD-10-CM | POA: Diagnosis present

## 2011-04-15 DIAGNOSIS — R42 Dizziness and giddiness: Secondary | ICD-10-CM | POA: Diagnosis present

## 2011-04-15 DIAGNOSIS — G8100 Flaccid hemiplegia affecting unspecified side: Secondary | ICD-10-CM | POA: Diagnosis present

## 2011-04-15 DIAGNOSIS — F329 Major depressive disorder, single episode, unspecified: Secondary | ICD-10-CM | POA: Diagnosis present

## 2011-04-15 DIAGNOSIS — I619 Nontraumatic intracerebral hemorrhage, unspecified: Principal | ICD-10-CM | POA: Diagnosis present

## 2011-04-15 DIAGNOSIS — R4701 Aphasia: Secondary | ICD-10-CM | POA: Diagnosis present

## 2011-04-15 DIAGNOSIS — J4489 Other specified chronic obstructive pulmonary disease: Secondary | ICD-10-CM | POA: Diagnosis present

## 2011-04-15 DIAGNOSIS — F3289 Other specified depressive episodes: Secondary | ICD-10-CM | POA: Diagnosis present

## 2011-04-15 DIAGNOSIS — B028 Zoster with other complications: Secondary | ICD-10-CM | POA: Diagnosis present

## 2011-04-15 DIAGNOSIS — D649 Anemia, unspecified: Secondary | ICD-10-CM | POA: Diagnosis present

## 2011-04-15 DIAGNOSIS — E039 Hypothyroidism, unspecified: Secondary | ICD-10-CM | POA: Diagnosis present

## 2011-04-15 DIAGNOSIS — I10 Essential (primary) hypertension: Secondary | ICD-10-CM | POA: Diagnosis present

## 2011-04-15 DIAGNOSIS — J449 Chronic obstructive pulmonary disease, unspecified: Secondary | ICD-10-CM | POA: Diagnosis present

## 2011-04-15 LAB — CBC
HCT: 36.9 % — ABNORMAL LOW (ref 39.0–52.0)
Hemoglobin: 12.9 g/dL — ABNORMAL LOW (ref 13.0–17.0)
MCH: 32.3 pg (ref 26.0–34.0)
MCHC: 35 g/dL (ref 30.0–36.0)

## 2011-04-15 LAB — COMPREHENSIVE METABOLIC PANEL
AST: 28 U/L (ref 0–37)
Albumin: 3.4 g/dL — ABNORMAL LOW (ref 3.5–5.2)
Chloride: 100 mEq/L (ref 96–112)
Creatinine, Ser: 0.91 mg/dL (ref 0.50–1.35)
Total Bilirubin: 0.3 mg/dL (ref 0.3–1.2)
Total Protein: 7.7 g/dL (ref 6.0–8.3)

## 2011-04-15 LAB — URINALYSIS, ROUTINE W REFLEX MICROSCOPIC
Glucose, UA: NEGATIVE mg/dL
Hgb urine dipstick: NEGATIVE
Ketones, ur: NEGATIVE mg/dL
Protein, ur: NEGATIVE mg/dL
pH: 5.5 (ref 5.0–8.0)

## 2011-04-15 LAB — APTT: aPTT: 39 seconds — ABNORMAL HIGH (ref 24–37)

## 2011-04-15 LAB — DIFFERENTIAL
Lymphocytes Relative: 21 % (ref 12–46)
Monocytes Absolute: 0.6 10*3/uL (ref 0.1–1.0)
Monocytes Relative: 8 % (ref 3–12)
Neutro Abs: 5 10*3/uL (ref 1.7–7.7)

## 2011-04-15 LAB — PROTIME-INR
INR: 1.06 (ref 0.00–1.49)
Prothrombin Time: 14 seconds (ref 11.6–15.2)

## 2011-04-15 LAB — ETHANOL: Alcohol, Ethyl (B): <11 mg/dL (ref 0–11)

## 2011-04-15 LAB — POCT I-STAT TROPONIN I: Troponin i, poc: 0.02 ng/mL (ref 0.00–0.08)

## 2011-04-15 MED ORDER — GADOBENATE DIMEGLUMINE 529 MG/ML IV SOLN
20.0000 mL | Freq: Once | INTRAVENOUS | Status: AC | PRN
  Administered 2011-04-15: 20 mL via INTRAVENOUS

## 2011-04-16 LAB — URINE CULTURE

## 2011-04-16 LAB — MRSA PCR SCREENING: MRSA by PCR: POSITIVE — AB

## 2011-04-17 ENCOUNTER — Inpatient Hospital Stay (HOSPITAL_COMMUNITY): Payer: Medicare Other

## 2011-04-17 LAB — LIPID PANEL
HDL: 45 mg/dL (ref >39)
LDL Cholesterol: 92 mg/dL (ref 0–99)
Total CHOL/HDL Ratio: 3.6 RATIO
Triglycerides: 136 mg/dL (ref <150)

## 2011-04-17 NOTE — Consult Note (Signed)
NAMEMAJESTY, OEHLERT NO.:  [id number]  MEDICAL RECORD NO.:  [id number]  LOCATION:  3018                         FACILITY:  [facility]  PHYSICIAN:  Weston Settle, MD   DATE OF BIRTH:  03-09-1930  DATE OF CONSULTATION: DATE OF DISCHARGE:                                CONSULTATION   REQUESTING PHYSICIAN:  Neurosurgery.  REASON FOR ADMISSION:  Hemorrhage and possible ischemic stroke.  HISTORY OF PRESENT ILLNESS:  The patient is a 77 year old white male who was found down at home by a neighbor.  He was unable to get up off the floor, although he was awake and alert, and he had right-sided weakness. An ambulance was called and he was brought over to the hospital at which point he had a CT scan of the brain, which I reviewed personally.  It indicates an intraparenchymal hemorrhage in the left frontoparietal area.  There is also some underlying hypodensity around it.  There is also hypodensity in the right occipital area of indeterminate age.  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  One week ago, the patient started having herpes zoster involving the left V1 distribution, he has been receiving acyclovir for that.  He also apparently has had a strep throat infections and is receiving cephalexin for that.  He was not taking any antiplatelet therapy at home.  PAST MEDICAL HISTORY: 1. Herpes zoster involving the left V1 distribution. 2. Strep throat. 3. Depression. 4. Gastroesophageal reflux disease. 5. Cataracts. 6. Ophthalmic artery aneurysm.  PAST SURGICAL HISTORY: 1. Ophthalmic artery aneurysm. 2. Cataract surgery.  MEDICATIONS AT HOME: 1. Cephalexin 500 mg p.o. q.6 h. 2. Acyclovir 800 mg 5 times a day. 3. Effexor XR 150 mg daily. 4. Remeron 30 mg daily. 5. Prilosec 20 mg daily.  ALLERGIES:  No known drug allergies.  SOCIAL HISTORY:  The patient is a nonsmoker.  No alcohol, illicit  drugs.  FAMILY HISTORY:  Noncontributory.  REVIEW OF SYSTEMS:  Pertinent as in the HPI.  All other review of systems are negative.  PHYSICAL EXAMINATION:  VITAL SIGNS:  Blood pressure is 151/90, pulse of 85, his temperature is 97.5. HEART:  Regular rate and rhythm.  S1 and S2.  No murmurs. LUNGS:  Clear to auscultation.  There are no carotid bruits. NEUROLOGIC:  He is awake and alert.  He is oriented.  Language is fluent.  He is hard of hearing.  Naming and repetition are intact. Pupils are equal and reactive.  Extraocular movements are intact.  He does have a significant rash involving the left V1 distribution.  Face is symmetrical.  Tongue is midline.  Strength is about 3/5 in the right upper extremity and in the right lower extremity.  On the left side it is 5/5.  There is a Babinski on the right and none on the left. Coordination is intact on the left, but he is unable to do it on the right.  Sensation is reduced on the right side versus the left side. There is no visual field cuts and visual acuity is intact in each of the four quadrants  per eye.  Gait testing was deferred at this time.  CBC is normal.  Urinalysis is normal.  Basic metabolic panel is normal. Liver enzymes are normal.  His alcohol level is negative.  ASSESSMENT/PLAN:  This is a 77 year old who presents with right-sided weakness, which is due to the left frontoparietal intracranial hemorrhage.  He may have had an ischemic stroke with hemorrhagic transformation based on the underlying hypodensity seen on CT scan. This may also explain why he has hypodensity in the right occipital area, however, he does not have any visual field cut so that may be an old stroke.  It is hard to tell at this time.  I will do an MRI of the brain without contrast to assess for acute ischemic strokes so that we can better understand the nature of the hemorrhage.  If it is not a hemorrhagic transformation of an acute ischemic stroke,  then it might be amyloid-related bleed.  If the MRI is positive for 2 acute strokes at one time then a transthoracic echocardiogram looking for an intracardiac or aortic atheroma as the source is reasonable.  The patient should be kept off antiplatelet agents until the blood has resorbed.  I will keep his systolic blood pressure under 308 so that I do not worsen the hemorrhage.  At this stage, I do not think that he necessarily needs to be investigated for an aneurysmal-related bleed, but further discussions with the family and the patient regarding that can occur over the next couple of days.  Thank you very much.          ______________________________ Weston Settle, MD     SE/MEDQ  D:  04/15/2011  T:  04/16/2011  Job:  [id number]  Electronically Signed by Weston Settle MD on 04/17/2011 04:10:14 PM

## 2011-04-19 ENCOUNTER — Inpatient Hospital Stay (HOSPITAL_COMMUNITY): Payer: Medicare Other

## 2011-04-19 DIAGNOSIS — I619 Nontraumatic intracerebral hemorrhage, unspecified: Secondary | ICD-10-CM

## 2011-04-19 NOTE — Consult Note (Signed)
[name], [name] NO.:  [id number]  MEDICAL RECORD NO.:  [id number]  LOCATION:  3018                         FACILITY:  [facility]  PHYSICIAN:  Tia Alert, MD     DATE OF BIRTH:  08-11-1929  DATE OF CONSULTATION:  04/15/2011 DATE OF DISCHARGE:                                CONSULTATION   CHIEF COMPLAINT:  Right hemiparesis.  PRESENT ILLNESS:  [name] is a 77 year old gentleman who I was asked to see in neurosurgical consultation regarding a left intracerebral hemorrhage.  The patient's family is in the room with him and helps with the history.  They state that a friend found him earlier today on the floor confused.  He was picked up by EMS and brought to emergency department.  He was found to be right hemiparetic with some confusion, some of the confusion has cleared while he has been in the ER.  Head CT showed a left parietal intracerebral hemorrhage with some surrounding hypodensity and there is a small hypodense region in the right occipital region also.  The patient denies any headache.  He denies any visual loss.  There has been no nausea or vomiting.  PAST MEDICAL HISTORY: 1. Hypertension. 2. Aneurysm coiling just 2 years ago.  This was unruptured. 3. Gastroesophageal reflux disease. 4. Recent shingles in the left forehead. 5. Dizziness. 6. Hypertension. 7. COPD. 8. Depression. 9. Hypothyroidism. 10.Recent GI bleed with anemia.  MEDICATIONS:  Amlodipine, Colace, ferrous sulfate, levothyroxine, albuterol, Effexor XR, Prilosec, Symbicort, Xanax, Zoloft.  ALLERGIES:  No known drug allergies.  FAMILY HISTORY:  Strong for cancer.  SOCIAL HISTORY:  Previous smoker.  Marital status, his wife recently deceased.  PHYSICAL EXAMINATION:  GENERAL:  Pleasant elderly gentleman lying in stretcher. VITAL SIGNS:  Temperature 97.7, blood pressure 148/85, pulse 85, respirations 18. HEENT:  There is no evidence of external trauma.  He does have  healing shingles in the forehead and around the left eye.  He has had previous cataract surgery.  His pupils are irregular and not very active.  His gaze is conjugate.  His extraocular movements are full.  I think he may have some visual field loss on the right versus neglect.  His left visual field seems to be okay.  No facial asymmetry.  Tongue protrudes in midline.  He is right hemiparetic, but he does move his right side. He can lift his leg off the bed.  He can lift his arm almost off the bed.  His grip strength is somewhat weak, but his strength is probably 2- 3/5 on the right.  Tone is a little decreased on the right.  Reflexes are hypoactive.  Gait is not tested.  Coordination and balance are not tested.  IMAGING STUDIES:  CT scan of the head shows a hyperdense lesion in the left parietal region.  It starts at the falx and extends superiorly. There is some surrounding hyperdensity that really spares the cortex. There is not a lot of mass effect or shift from this.  There is a small hyperdensity in the right occipital region that the radiologists are calling an acute infarct.  ASSESSMENT/PLAN:  This is  a 77 year old gentleman with a left-sided intracerebral hemorrhage.  He may or may not have a right occipital infarct, though it looks old to me.  The radiologist are calling this acute.  The hemorrhage has no significant mass effect or shift.  I do not believe he needs any type of surgical evaluation especially given his recent neurologic exam.  He has good alertness and he is mildly right hemiparetic.  He is not on any anticoagulants nor he is on any antiplatelet agents as best I can tell.  He is probably best served on the neurologist/Stroke Service especially given the fact that they call an acute stroke in the right occipital region and he may have 2 separate lesions.  He probably needs an MRI to rule this out.  Also I think that they are team approach on the Stroke Service  who will aide in the social issues that are likely to come about with him with his need for increased level of care given his dizziness and recent falls and now this new hemorrhage causing him to be right hemiparetic.  So I think he will be best served on the Stroke Service.  I have asked the neurologist to see him and admit him and I will follow along in consultation.     Tia Alert, MD     DSJ/MEDQ  D:  04/15/2011  T:  04/16/2011  Job:  [id number]  Electronically Signed by Marikay Alar MD on 04/19/2011 01:15:21 PM

## 2011-04-21 ENCOUNTER — Inpatient Hospital Stay (HOSPITAL_COMMUNITY)
Admission: RE | Admit: 2011-04-21 | Discharge: 2011-05-19 | DRG: 945 | Disposition: A | Discharge status: Home Health | Payer: Medicare Other | Source: Other Acute Inpatient Hospital | Attending: Physical Medicine & Rehabilitation | Admitting: Physical Medicine & Rehabilitation

## 2011-04-21 DIAGNOSIS — G936 Cerebral edema: Secondary | ICD-10-CM

## 2011-04-21 DIAGNOSIS — K219 Gastro-esophageal reflux disease without esophagitis: Secondary | ICD-10-CM

## 2011-04-21 DIAGNOSIS — R4701 Aphasia: Secondary | ICD-10-CM

## 2011-04-21 DIAGNOSIS — Z860100 Personal history of colon polyps, unspecified: Secondary | ICD-10-CM

## 2011-04-21 DIAGNOSIS — B029 Zoster without complications: Secondary | ICD-10-CM

## 2011-04-21 DIAGNOSIS — J4489 Other specified chronic obstructive pulmonary disease: Secondary | ICD-10-CM

## 2011-04-21 DIAGNOSIS — J449 Chronic obstructive pulmonary disease, unspecified: Secondary | ICD-10-CM

## 2011-04-21 DIAGNOSIS — Z8546 Personal history of malignant neoplasm of prostate: Secondary | ICD-10-CM

## 2011-04-21 DIAGNOSIS — R1310 Dysphagia, unspecified: Secondary | ICD-10-CM

## 2011-04-21 DIAGNOSIS — A498 Other bacterial infections of unspecified site: Secondary | ICD-10-CM

## 2011-04-21 DIAGNOSIS — Z91041 Radiographic dye allergy status: Secondary | ICD-10-CM

## 2011-04-21 DIAGNOSIS — F329 Major depressive disorder, single episode, unspecified: Secondary | ICD-10-CM

## 2011-04-21 DIAGNOSIS — Z79899 Other long term (current) drug therapy: Secondary | ICD-10-CM

## 2011-04-21 DIAGNOSIS — I10 Essential (primary) hypertension: Secondary | ICD-10-CM

## 2011-04-21 DIAGNOSIS — Z87891 Personal history of nicotine dependence: Secondary | ICD-10-CM

## 2011-04-21 DIAGNOSIS — I619 Nontraumatic intracerebral hemorrhage, unspecified: Secondary | ICD-10-CM

## 2011-04-21 DIAGNOSIS — Z96659 Presence of unspecified artificial knee joint: Secondary | ICD-10-CM

## 2011-04-21 DIAGNOSIS — R471 Dysarthria and anarthria: Secondary | ICD-10-CM

## 2011-04-21 DIAGNOSIS — G8100 Flaccid hemiplegia affecting unspecified side: Secondary | ICD-10-CM

## 2011-04-21 DIAGNOSIS — F3289 Other specified depressive episodes: Secondary | ICD-10-CM

## 2011-04-21 DIAGNOSIS — Z5189 Encounter for other specified aftercare: Principal | ICD-10-CM

## 2011-04-21 DIAGNOSIS — G4700 Insomnia, unspecified: Secondary | ICD-10-CM

## 2011-04-21 DIAGNOSIS — R269 Unspecified abnormalities of gait and mobility: Secondary | ICD-10-CM

## 2011-04-21 DIAGNOSIS — Z9181 History of falling: Secondary | ICD-10-CM

## 2011-04-21 DIAGNOSIS — E039 Hypothyroidism, unspecified: Secondary | ICD-10-CM

## 2011-04-21 DIAGNOSIS — N390 Urinary tract infection, site not specified: Secondary | ICD-10-CM

## 2011-04-21 LAB — URINALYSIS, ROUTINE W REFLEX MICROSCOPIC
Glucose, UA: NEGATIVE mg/dL
Protein, ur: NEGATIVE mg/dL
Specific Gravity, Urine: 1.02 (ref 1.005–1.030)

## 2011-04-21 LAB — URINE MICROSCOPIC-ADD ON

## 2011-04-22 DIAGNOSIS — I619 Nontraumatic intracerebral hemorrhage, unspecified: Secondary | ICD-10-CM

## 2011-04-22 DIAGNOSIS — G8110 Spastic hemiplegia affecting unspecified side: Secondary | ICD-10-CM

## 2011-04-22 LAB — CBC
HCT: 40.6 % (ref 39.0–52.0)
Hemoglobin: 14.2 g/dL (ref 13.0–17.0)
MCH: 31.6 pg (ref 26.0–34.0)
MCHC: 35 g/dL (ref 30.0–36.0)
MCV: 90.4 fL (ref 78.0–100.0)
Platelets: 217 10*3/uL (ref 150–400)
RBC: 4.49 MIL/uL (ref 4.22–5.81)
RDW: 13.3 % (ref 11.5–15.5)
WBC: 10.5 10*3/uL (ref 4.0–10.5)

## 2011-04-22 LAB — COMPREHENSIVE METABOLIC PANEL
ALT: 12 U/L (ref 0–53)
AST: 21 U/L (ref 0–37)
CO2: 21 mEq/L (ref 19–32)
Chloride: 101 mEq/L (ref 96–112)
GFR calc non Af Amer: >60 mL/min (ref >60)
Potassium: 3.9 mEq/L (ref 3.5–5.1)
Sodium: 133 mEq/L — ABNORMAL LOW (ref 135–145)
Total Bilirubin: 0.6 mg/dL (ref 0.3–1.2)

## 2011-04-22 LAB — DIFFERENTIAL
Basophils Relative: 0 % (ref 0–1)
Eosinophils Absolute: 0.1 10*3/uL (ref 0.0–0.7)
Neutrophils Relative %: 75 % (ref 43–77)

## 2011-04-23 DIAGNOSIS — I619 Nontraumatic intracerebral hemorrhage, unspecified: Secondary | ICD-10-CM

## 2011-04-23 LAB — URINE CULTURE

## 2011-04-26 DIAGNOSIS — I619 Nontraumatic intracerebral hemorrhage, unspecified: Secondary | ICD-10-CM

## 2011-04-26 DIAGNOSIS — G8110 Spastic hemiplegia affecting unspecified side: Secondary | ICD-10-CM

## 2011-04-27 DIAGNOSIS — G8110 Spastic hemiplegia affecting unspecified side: Secondary | ICD-10-CM

## 2011-04-27 DIAGNOSIS — I619 Nontraumatic intracerebral hemorrhage, unspecified: Secondary | ICD-10-CM

## 2011-04-27 LAB — CBC
HCT: 37.2 — ABNORMAL LOW
HCT: 40.6
Hemoglobin: 12.9 — ABNORMAL LOW
Hemoglobin: 13.7
MCHC: 33.6
MCV: 94.9
MCV: 95
MCV: 95.1
MCV: 95.2
Platelets: 153
Platelets: 192
RBC: 3.97 — ABNORMAL LOW
RBC: 4.02 — ABNORMAL LOW
RDW: 12.9
RDW: 13.2
WBC: 4.8
WBC: 7.2
WBC: 8.1

## 2011-04-27 LAB — HEPATIC FUNCTION PANEL
Alkaline Phosphatase: 63
Bilirubin, Direct: 0.1
Total Bilirubin: 0.6

## 2011-04-27 LAB — URINE CULTURE: Colony Count: >=100000

## 2011-04-27 LAB — BASIC METABOLIC PANEL
BUN: 11
BUN: 11
CO2: 23
CO2: 26
Calcium: 8 — ABNORMAL LOW
Calcium: 8.6
Calcium: 8.8
Calcium: 9
Chloride: 104
Chloride: 108
Chloride: 97
Creatinine, Ser: 0.95
Creatinine, Ser: 0.96
Creatinine, Ser: 1.01
GFR calc Af Amer: >60
GFR calc Af Amer: >60
GFR calc non Af Amer: >60
Glucose, Bld: 112 — ABNORMAL HIGH
Glucose, Bld: 129 — ABNORMAL HIGH
Sodium: 132 — ABNORMAL LOW
Sodium: 139

## 2011-04-27 LAB — DIFFERENTIAL
Basophils Absolute: 0
Basophils Relative: 0
Eosinophils Absolute: 0
Eosinophils Relative: 0
Monocytes Absolute: 0.8

## 2011-04-27 LAB — LIPID PANEL
LDL Cholesterol: 60
Total CHOL/HDL Ratio: 4.7
VLDL: 25

## 2011-04-27 LAB — URINALYSIS, ROUTINE W REFLEX MICROSCOPIC
Bilirubin Urine: NEGATIVE
Bilirubin Urine: NEGATIVE
Ketones, ur: NEGATIVE
Nitrite: NEGATIVE
Nitrite: POSITIVE — AB
Protein, ur: 100 — AB
Specific Gravity, Urine: 1.021 (ref 1.005–1.030)
Urobilinogen, UA: 0.2 mg/dL (ref 0.0–1.0)

## 2011-04-27 LAB — COMPREHENSIVE METABOLIC PANEL
ALT: 37
AST: 166 — ABNORMAL HIGH
Calcium: 8.3 — ABNORMAL LOW
Creatinine, Ser: 1.08
GFR calc Af Amer: >60
Glucose, Bld: 115 — ABNORMAL HIGH
Sodium: 134 — ABNORMAL LOW
Total Protein: 6.2

## 2011-04-27 LAB — MAGNESIUM
Magnesium: 1.9
Magnesium: 2

## 2011-04-27 LAB — VITAMIN B12: Vitamin B-12: 287 (ref 211–911)

## 2011-04-27 LAB — OSMOLALITY: Osmolality: 275

## 2011-04-27 LAB — RPR: RPR Ser Ql: NONREACTIVE

## 2011-04-27 LAB — HOMOCYSTEINE: Homocysteine: 9.9

## 2011-04-27 LAB — T3: T3, Total: 114.7 (ref 80.0–204.0)

## 2011-04-27 LAB — HEMOGLOBIN A1C
Hgb A1c MFr Bld: 6.1
Mean Plasma Glucose: 131

## 2011-04-27 LAB — PROTIME-INR: INR: 1.1

## 2011-04-27 LAB — URINE MICROSCOPIC-ADD ON

## 2011-04-27 LAB — TSH: TSH: 2.984

## 2011-04-27 LAB — FOLATE RBC: RBC Folate: 881 — ABNORMAL HIGH

## 2011-04-27 LAB — APTT: aPTT: 41 — ABNORMAL HIGH

## 2011-04-27 LAB — URIC ACID: Uric Acid, Serum: 5.1

## 2011-04-27 LAB — AMMONIA: Ammonia: 25

## 2011-04-27 NOTE — H&P (Addendum)
NAMESHARIF, Zachary NO.:  [id number]  MEDICAL RECORD NO.:  [id number]  LOCATION:  4029                         FACILITY:  [facility]  PHYSICIAN:  Erick Colace, M.D.DATE OF BIRTH:  [birth year]  DATE OF ADMISSION:  04/21/2011 DATE OF DISCHARGE:                             HISTORY & PHYSICAL   CHIEF COMPLAINT:  Right-sided weakness and confusion.  PRIMARY CARE PROVIDER:  Ernestina Penna, MD  HISTORY OF PRESENT ILLNESS:  This is a 77 year old white male with history of hypertension, ophthalmic artery aneurysm with coiling, who was found down with right-sided weakness on April 15, 2011.  CT of the head showed a left parietal intracranial hemorrhage with surrounding hypodensity.  MRI/MRA of the brain showed large lobar hemorrhage in the left parietal lobe measuring 6 x 4 cm with moderate atrophy and bilateral CSF hygromas and chronic right occipital lobe infarct.  Coils were in place.  A 2-D echo was unremarkable.  A neurosurgical intervention was recommended.  Followup head CT showed stable hemorrhage and edema.  Carotid Dopplers were negative.  He has extensive language issues including expressive and receptive aphasia.  He has dense weakness and right inattention.  Rehab is working on this patient and the rehab team evaluated him for potential rehab admission and felt he could benefit.  REVIEW OF SYSTEMS:  Notable for weakness.  Other review items are difficult to assess and tabulate due to the patient's cognitive and language status.  PAST MEDICAL HISTORY:  Positive for: 1. Severe COPD. 2. Multiple episodes of pneumonia. 3. Recent shingles, left forehead. 4. Recent GI bleed. 5. Gastritis. 6. Ileocecal lesion in April 2012. 7. Hypertension. 8. GERD. 9. Gait disorder with falls times few months. 10.Left total knee replacement. 11.Hypothyroidism. 12.Prostate cancer. 13.Left shoulder decompression in 2003. 14.Iron deficiency  anemia. 15.Dizziness. 16.Depression. 17.Diverticulosis. 18.Colonic polyps.  FAMILY HISTORY:  Positive for cancer.  SOCIAL HISTORY:  The patient lives alone at [facility].  He was independent prior to arrival.  Lindell Berger report falls prior to this admission for an undetermined amount of time, but at least have been happening for several months.  He quit smoking 10 years ago and does not drink.  Son is in town who can provide potentially some assistance and her other family remotely apparently.  ALLERGIES:  CONTRAST MEDIA.  HOME MEDICATIONS AND LABORATORY DATA:  Please see written H and P.  PHYSICAL EXAMINATION:  VITAL SIGNS:  Blood pressure is 146/81, pulse 89, respiratory rate 18, temperature 97.5. GENERAL:  The patient is alert.  He follows some simple one-step commands that is usually at the 40%-50% accuracy.  He mutters a few words, generally is unintelligible. HEENT:  Ear, nose, and throat is notable for edentulous mouth with pink moist mucosa. NECK:  Supple without JVD or lymphadenopathy. CHEST:  Clear except for some crackles at the bases. HEART:  Regular rate and rhythm without murmurs, rubs, or gallops. EXTREMITIES:  No clubbing, cyanosis, or edema. ABDOMEN:  Soft, nontender.  Bowel sounds are positive. SKIN:  Generally intact except for some abrasions noted on the forehead and the extremities indicative of prior falls and lacerations. NEUROLOGIC:  Cranial nerves  II through XII notable for right central VII, tongue deviation.  He is potentially hard of hearing as well. Sensation is diminished to light touch on the right side, although he does withdraw to pain, more in the lower than upper extremity.  Motors testing, I did not elicit any active movement of the right arm and leg today.  Left upper and lower extremity grossly 2-3/5, perhaps more depending on his attention levels.  The patient did have a hemi- inattention on exam.  Judgment,  orientation, memory are all impaired and mood is flat.  POSTADMISSION PHYSICIAN EVALUATION: 1. Functional deficit secondary to left parietal hematoma with right     hemiparesis, right neglect, dysphagia, dysarthria, aphasia. 2. The patient is admitted to receive collaborative interdisciplinary     care between the physiatrist, rehab nursing staff, and therapy     team. 3. The patient's level of medical complexity and substantial therapy     needs in context of that medical necessity cannot be provided at a     lesser intensity of care. 4. The patient has experienced substantial functional loss from his     baseline.  The patient was independent apparently before and     driving, although his safety at this level was undetermined and     likely a question.  Currently, he is total assist for bed mobility     65%, total assist for attempt to stand 30%, max assist for upper     body and lower body care.  Judging by the patient's diagnosis,     physical exam, and functional history, he has potential for     functional progress which will result in measurable gains while in     inpatient rehab.  These gains will be of substantial and practical     use upon discharge to home in facilitating mobility and self-care. 5. The physiatrist will provide 24-hour management of the medical     needs as well as oversight of the therapy plan/treatment and     provide guidance as appropriate regarding interaction of the two.     Medical problem list and plan are below. 6. The 24-hour rehab nursing team will assist in the management of the     patient's skin care needs as well as nutrition, pain, bowel and     bladder function, integration of therapy concepts and techniques. 7. PT will assess and treat for lower extremity strength, range of     motion, functional mobility, safety, neuromuscular education,     cognitive perceptual training, adaptive techniques, equipment,     family education with goals  min-to-mod assist. 8. OT will assess and treat for upper extremity use, ADLs, adaptive     techniques, equipment, function mobility, upper extremity strength,     visual perceptual and cognitive perceptual training, goals min-to-     mod assist. 9. Speech and Language Pathology will follow for cognition, speech     intelligibility, communication, swallowing with goals min-to-mod     assist. 10.Case management and social worker will assess and treat for     psychosocial issues and discharge planning. 11.Team conferences will be held weekly to assess progress towards     goals and to determine barriers at discharge. 12.The patient demonstrated sufficient medical stability, exercise     capacity to tolerate at least 3 hours of therapy per day at least 5     days per week. 13.Estimated length of stay is 3 weeks, potentially more.  Prognosis     fair to good.  MEDICAL PROBLEM LIST AND PLAN: 1. Deep venous thrombosis prophylaxis with sequential compression     devices given hematoma.  Consider venous Doppler studies.  TED     stockings as needed as well. 2. Pain management with Tylenol p.r.n. for now.  This does not seem to     be a major issue at present. 3. Mood:  Remeron 30 mg p.o. at bedtime to start tomorrow.  Team will    follow up for any emotional needs going forward appropriate as for     counseling as cognition and communication improved. 4. Hypothyroidism:  No supplementation at present.  Consider checking     a TSH during admit. 5. Hypertension:  Lisinopril on board.  Blood pressure is high normal     at present.  We will follow with increased activity and just going     forward as appropriate. 6. Chronic obstructive pulmonary disease:  On Symbicort b.i.d.  O2     sats stable at present on oxygen.  Encourage incentive spirometry     out of bed, etc. 7. Bladder:  Begin voiding trials with I and O cath p.r.n.  Consider     time voids to maximize continence and to prevent skin  breakdown.     We will toilet q.3 h. while awake. 8. Fluids, electrolytes, nutrition:  The patient is on a D3 nectar     diet currently.  Supplement with IV fluids at bedtime.  Check a     basic metabolic panel in the morning.     Ranelle Oyster, M.D.   ______________________________ Erick Colace, M.D.    ZTS/MEDQ  D:  04/21/2011  T:  04/21/2011  Job:  [id number]  cc:   Ernestina Penna, M.D.  Electronically Signed by Claudette Laws M.D. on 04/27/2011 12:12:35 PM Electronically Signed by Faith Rogue M.D. on 05/26/2011 09:35:46 AM

## 2011-04-28 LAB — DIFFERENTIAL
Basophils Relative: 0
Eosinophils Absolute: 0.4
Neutrophils Relative %: 72

## 2011-04-28 LAB — CBC
MCHC: 33.8
MCV: 96.9
Platelets: 195
RDW: 13

## 2011-04-28 LAB — URINALYSIS, ROUTINE W REFLEX MICROSCOPIC
Bilirubin Urine: NEGATIVE
Glucose, UA: NEGATIVE
Hgb urine dipstick: NEGATIVE
Specific Gravity, Urine: 1.016
Urobilinogen, UA: 0.2

## 2011-04-28 LAB — POCT CARDIAC MARKERS
CKMB, poc: 1.1
CKMB, poc: <1 — ABNORMAL LOW
Myoglobin, poc: 114
Myoglobin, poc: 98

## 2011-04-28 LAB — COMPREHENSIVE METABOLIC PANEL
ALT: 15
AST: 24
Albumin: 3 — ABNORMAL LOW
Alkaline Phosphatase: 60
BUN: 10
Chloride: 98
GFR calc Af Amer: >60
Potassium: 3.6
Sodium: 135
Total Bilirubin: 0.4
Total Protein: 6.5

## 2011-04-28 LAB — LACTIC ACID, PLASMA: Lactic Acid, Venous: 0.9

## 2011-04-28 LAB — CULTURE, BLOOD (ROUTINE X 2)
Culture: NO GROWTH
Culture: NO GROWTH

## 2011-04-28 LAB — URINE MICROSCOPIC-ADD ON

## 2011-04-28 LAB — CK TOTAL AND CKMB (NOT AT ARMC): CK, MB: 0.5

## 2011-04-28 LAB — BASIC METABOLIC PANEL
BUN: 11
CO2: 29
Chloride: 99
Creatinine, Ser: 1.32

## 2011-04-28 LAB — TROPONIN I: Troponin I: 0.02

## 2011-04-28 LAB — PSA: PSA: 4.29 — ABNORMAL HIGH

## 2011-04-28 LAB — URINE CULTURE

## 2011-04-28 LAB — TSH: TSH: 1.677

## 2011-04-28 LAB — B-NATRIURETIC PEPTIDE (CONVERTED LAB): Pro B Natriuretic peptide (BNP): 61

## 2011-04-28 LAB — APTT: aPTT: 37

## 2011-04-29 ENCOUNTER — Other Ambulatory Visit (HOSPITAL_COMMUNITY): Payer: Medicare Other

## 2011-04-29 ENCOUNTER — Inpatient Hospital Stay (HOSPITAL_COMMUNITY): Payer: Medicare Other

## 2011-04-29 DIAGNOSIS — G8110 Spastic hemiplegia affecting unspecified side: Secondary | ICD-10-CM

## 2011-04-29 DIAGNOSIS — I619 Nontraumatic intracerebral hemorrhage, unspecified: Secondary | ICD-10-CM

## 2011-04-29 LAB — URINALYSIS, ROUTINE W REFLEX MICROSCOPIC
Glucose, UA: NEGATIVE mg/dL
Ketones, ur: NEGATIVE mg/dL
Leukocytes, UA: NEGATIVE
Protein, ur: NEGATIVE mg/dL
Urobilinogen, UA: 0.2 mg/dL (ref 0.0–1.0)

## 2011-04-30 DIAGNOSIS — I619 Nontraumatic intracerebral hemorrhage, unspecified: Secondary | ICD-10-CM

## 2011-04-30 DIAGNOSIS — G8110 Spastic hemiplegia affecting unspecified side: Secondary | ICD-10-CM

## 2011-04-30 LAB — BASIC METABOLIC PANEL
CO2: 28 mEq/L (ref 19–32)
Calcium: 9.5 mg/dL (ref 8.4–10.5)
Creatinine, Ser: 0.87 mg/dL (ref 0.50–1.35)
GFR calc non Af Amer: 80 mL/min — ABNORMAL LOW (ref >90)
Sodium: 138 mEq/L (ref 135–145)

## 2011-04-30 LAB — URINE CULTURE
Culture: NO GROWTH
Special Requests: POSITIVE

## 2011-05-02 NOTE — Discharge Summary (Signed)
[name], [name]                   ACCOUNT NO.:  [id number]  MEDICAL RECORD NO.:  [id number]  LOCATION:  3018                         FACILITY:  [facility]  PHYSICIAN:  Pramod P. Pearlean Brownie, MD    DATE OF BIRTH:  January 16, 1930  DATE OF ADMISSION:  04/15/2011 DATE OF DISCHARGE:  04/21/2011                              DISCHARGE SUMMARY   DIAGNOSES AT TIME OF DISCHARGE: 1. 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 2. Hypertension. 3. Herpes zoster in the left V1 distribution. 4. Aneurysm coiling 2 years ago, right paraophthalmic     artery aneurysm. 5. Gastroesophageal reflux disease. 6. Dizziness. 7. Chronic obstructive pulmonary disease. 8. Depression. 9. Hypothyroidism. 10.Recent gastrointestinal bleed with anemia. 11.Cataracts, status post surgery. 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  MEDICINES AT TIME OF DISCHARGE: 1. Lisinopril 10 mg a day. 2. Bactroban nasally b.i.d. 3. Zovirax 800 mg 1 tablet q.4 h. for a total of 10-day course,     started Sunday, April 11, 2011, and last dose September 26,     20 12. 4. Docusate sodium 1 tablet b.i.d. 5. Erythromycin 5 mg/g ophthalmic ointment 1 application t.i.d. 6. Mirtazapine 30 mg nightly. 7. Omeprazole 1 capsule a day. 8. Symbicort 160/4.5 mcg 2 puffs b.i.d. 9. Xanax 0.5 mg daily p.r.n. anxiety. 10.Zoloft 100 mg a day.  STUDIES PERFORMED: 1. CT of the brain on admission shows large left parietal     intraparenchymal hemorrhage.  Small focal infarct right occipital     lobe with an acute appearance. 2. MRI of the brain shows large lobar hemorrhage, high left parietal     lobe.  No underlying mass or lesion identified.  Atrophy and     extensive chronic ischemic change.  No acute ischemic infarct. 3. MRA of the brain shows satisfactory appearance post coiling of the     right periophthalmic artery aneurysm.   No significant intracranial     stenosis. 4. Followup CT at 48 hours shows interval mild expansion of large left     parietal lobe hemorrhage.  Hemorrhage has extended inferiorly into     superior aspect of left basal ganglia.  No significant midline     shift.  No hydrocephalus. 5. Last CT performed on April 19, 2011, again shows mild     progression of left parietal hemorrhage since April 17, 2011,     now 74 x 49 mm.  Stable associated edema and mild mass effect.  No     intraventricular or extraaxial extension.  No new abnormality. 6. Two-D echocardiogram shows EF of 55-60% with no obvious source of     embolus. 7. Carotid Doppler shows no ICA stenosis. 8. EKG shows normal sinus rhythm.  LABORATORY STUDIES:  Hemoglobin A1c 6.1.  Cholesterol 164, triglycerides 136, HDL 45, and LDL 92.  Urine culture negative.  MRSA screen positive. Alcohol less than 11.  Chemistry with glucose 107.  PTT 39 and PT and INR normal.  Troponin I negative.  CBC:  Hemoglobin 12.9, otherwise normal.  BNP 119.  T4 of 0.64 and TSH 4.245.  HISTORY OF PRESENT  ILLNESS:  [name] is a 77 year old white male who was found at home by his neighbor.  He is unable to get up off the floor, although he was awake and alert with had right hemiparesis.  An ambulance was called and he was brought to the hospital where CT of the brain showed a left frontoparietal intraparenchymal hemorrhage.  The patient has a history of right para-ophthalmic artery aneurysm status post coiling 2 years ago.  One week ago, the patient started having herpes zoster involving the left V1 distribution and has been receiving acyclovir.  The patient also has history of strep throat infection and is on cephalexin.  He was not taking antiplatelets prior to admission. He was admitted to the hospital for further evaluation.  HOSPITAL COURSE:  The patient's left parietal lobar hemorrhage was possibly hypertensive in etiology as his blood  pressure in the emergency room was 163/92.  Other possible etiology is a shingles vasculitis. Shingles can called vasculitis that could potentially lead to hemorrhage.  Lisinopril was added for elevated blood pressure.  PT, OT, and Speech Therapy evaluated and recommended for rehab as the patient has expressive and receptive aphasia as well as right hemianopsia and right flaccid hemiparesis.  The patient has a son and daughter who both involved in his care.  Daughter has healthcare power of attorney.  The son feels he can provide care at the time of discharge.  Decision was made to send to inpatient rehab with eventual return home with son for 24-hour care.  Family conference with rehab and all are agreeable. Related to his swallowing, the patient initially was able to tolerate dysphagia 3 thin liquid diet.  He began to cough with liquids in the hospital and he was downgraded to a dysphagia 3 nectar thick liquid diet and chopped meats.  The patient is now stable for discharge to Rehab and arrangements are in place.  CONDITION AT TIME OF DISCHARGE:  The patient is alert and oriented to person.  He has improved speech, though he still has expressive and receptive aphasia.  He is alert.  He is able to identify daughter.  He is able to follow simple commands well.  He has dense right arm and leg flaccid hemiparesis, though he moves his left side well and his heart rate is regular.  His breath sounds are clear.  DISCHARGE PLAN: 1. Discharge to Rehab for ongoing PT, OT, and speech therapy. 2. No antiplatelets or anticoagulants. 3. Continue herpes zoster treatment through tonight and then     discontinue. 4. Followup with primary care physician within 1 month. 5. Follow up with Dr. Delia Heady in his office in 2-3 months.      Annie Main, N.P.   ______________________________ Sunny Schlein. Pearlean Brownie, MD    SB/MEDQ  D:  04/21/2011  T:  04/21/2011  Job:  [id number]  cc:   Ernestina Penna,  M.D.  Electronically Signed by Annie Main N.P. on 04/22/2011 12:23:10 PM Electronically Signed by Delia Heady MD on 05/02/2011 10:27:57 PM

## 2011-05-03 DIAGNOSIS — I619 Nontraumatic intracerebral hemorrhage, unspecified: Secondary | ICD-10-CM

## 2011-05-03 DIAGNOSIS — G8110 Spastic hemiplegia affecting unspecified side: Secondary | ICD-10-CM

## 2011-05-03 LAB — BASIC METABOLIC PANEL
BUN: 20 mg/dL (ref 6–23)
CO2: 28 mEq/L (ref 19–32)
Chloride: 102 mEq/L (ref 96–112)
GFR calc Af Amer: 88 mL/min — ABNORMAL LOW (ref >90)
Potassium: 4.4 mEq/L (ref 3.5–5.1)

## 2011-05-04 DIAGNOSIS — I619 Nontraumatic intracerebral hemorrhage, unspecified: Secondary | ICD-10-CM

## 2011-05-04 DIAGNOSIS — G8110 Spastic hemiplegia affecting unspecified side: Secondary | ICD-10-CM

## 2011-05-06 DIAGNOSIS — I619 Nontraumatic intracerebral hemorrhage, unspecified: Secondary | ICD-10-CM

## 2011-05-06 DIAGNOSIS — G8110 Spastic hemiplegia affecting unspecified side: Secondary | ICD-10-CM

## 2011-05-06 LAB — URINALYSIS, ROUTINE W REFLEX MICROSCOPIC
Glucose, UA: NEGATIVE mg/dL
Leukocytes, UA: NEGATIVE
Nitrite: NEGATIVE
Specific Gravity, Urine: 1.016 (ref 1.005–1.030)
pH: 5.5 (ref 5.0–8.0)

## 2011-05-07 LAB — CREATININE, SERUM
Creatinine, Ser: 1.19
GFR calc Af Amer: >60
GFR calc non Af Amer: 60 — ABNORMAL LOW

## 2011-05-07 LAB — URINE CULTURE

## 2011-05-10 DIAGNOSIS — G8110 Spastic hemiplegia affecting unspecified side: Secondary | ICD-10-CM

## 2011-05-10 DIAGNOSIS — I619 Nontraumatic intracerebral hemorrhage, unspecified: Secondary | ICD-10-CM

## 2011-05-12 DIAGNOSIS — I619 Nontraumatic intracerebral hemorrhage, unspecified: Secondary | ICD-10-CM

## 2011-05-12 DIAGNOSIS — G8110 Spastic hemiplegia affecting unspecified side: Secondary | ICD-10-CM

## 2011-05-25 ENCOUNTER — Ambulatory Visit: Payer: Medicare Other

## 2011-05-25 ENCOUNTER — Ambulatory Visit: Payer: Medicare Other | Admitting: Occupational Therapy

## 2011-05-25 ENCOUNTER — Ambulatory Visit: Payer: Medicare Other | Attending: Physical Medicine & Rehabilitation | Admitting: Physical Therapy

## 2011-05-25 DIAGNOSIS — R279 Unspecified lack of coordination: Secondary | ICD-10-CM | POA: Insufficient documentation

## 2011-05-25 DIAGNOSIS — M6281 Muscle weakness (generalized): Secondary | ICD-10-CM | POA: Insufficient documentation

## 2011-05-25 DIAGNOSIS — I69920 Aphasia following unspecified cerebrovascular disease: Secondary | ICD-10-CM | POA: Insufficient documentation

## 2011-05-25 DIAGNOSIS — R269 Unspecified abnormalities of gait and mobility: Secondary | ICD-10-CM | POA: Insufficient documentation

## 2011-05-25 DIAGNOSIS — Z5189 Encounter for other specified aftercare: Secondary | ICD-10-CM | POA: Insufficient documentation

## 2011-05-25 DIAGNOSIS — I69998 Other sequelae following unspecified cerebrovascular disease: Secondary | ICD-10-CM | POA: Insufficient documentation

## 2011-05-25 DIAGNOSIS — I69919 Unspecified symptoms and signs involving cognitive functions following unspecified cerebrovascular disease: Secondary | ICD-10-CM | POA: Insufficient documentation

## 2011-05-31 ENCOUNTER — Ambulatory Visit: Payer: Medicare Other | Admitting: Physical Therapy

## 2011-05-31 NOTE — Discharge Summary (Signed)
NAMETAIVEN, GREENLEY NO.:  [id number]  MEDICAL RECORD NO.:  [id number]  LOCATION:  4027                         FACILITY:  [facility]  PHYSICIAN:  [name], M.D.DATE OF BIRTH:  [birth year]  DATE OF ADMISSION:  04/21/2011 DATE OF DISCHARGE:  05/19/2011                              DISCHARGE SUMMARY   DISCHARGE DIAGNOSES: 1. Left parietal intracerebral hemorrhage. 2. Insomnia. 3. Chronic obstructive pulmonary disease. 4. Hypertension. 5. Depression.  HISTORY OF PRESENT ILLNESS:  [name] is a 77 year old male with history of hypertension, ophthalmic artery aneurysm with coiling found down with right-sided weakness.  On April 15, 2011, CT of head done showed left parietal intracerebral hemorrhage with surrounding hypodensity.  MRI/MRA of brain showed large lobar hemorrhage in left parietal lobe, 6 x 4 cm, moderate atrophy with bilateral CSF hygromas, chronic right occipital lobe infarct and satisfactory appearance of coiled in right para-ophthalmic artery aneurysm.  A 2-D echo done showed EF of 55-60%, no wall abnormality.  No surgical intervention needed per Dr. Marikay Alar.  Followup CT of April 17, 2011, showed mild progression to 74 x 49 mm in bleed, but stable edema.  Carotid Dopplers done showed no ICA stenosis.  Currently, the patient with expressive and receptive aphasia, 60% accurate for yes-no questions and is able to repeat phrases with moderate assist and moderate dysarthria.  He continues with right flaccid hemiparesis with right inattention and apraxia.  Therapies are currently working on pre-gait activities and the patient is able to sit edge of bed for 25 minutes.  The patient was evaluated by Rehab team and we felt that he would benefit from CIR program.  PAST MEDICAL HISTORY:  Significant for: 1. COPD. 2. Multiple episodes of pneumonia. 3. Recent shingles in left forehead. 4. Recent GI bleed with gastritis and ileocecal  lesion in April 2012. 5. Iron deficiency anemia. 6. Left shoulder decompression in 2003. 7. Prostate cancer. 8. Hypothyroidism. 9. Left total knee replacement. 10.Hypertension. 11.GERD. 12.Intermittent dizziness. 13.Gait disorder with falls for the past few months. 14.Depression. 15.Diverticulosis. 16.Colon polyps.  ALLERGIES:  CONTRAST MEDIA.  FAMILY HISTORY:  Positive for cancer.  SOCIAL HISTORY:  The patient lives alone at [facility], does not has an apartment, no steps at entry.  He has a history of 50-pack-year tobacco use, quit 10 years ago.  Does not use any alcohol.  Son in town is supportive and plans on providing supervision and assistance past discharge.  FUNCTIONAL HISTORY:  The patient was independent in driving prior to admission.  FUNCTIONAL STATUS:  The patient is total-assist 65% for bed mobility, total-assist 30% to attempts standing, max-assist for grooming, max- assist for upper body dressing.  PHYSICAL EXAMINATION:  VITAL SIGNS:  Blood pressure 146/81, pulse 89, respiratory rate 18, temperature 97.5. GENERAL:  The patient is elderly male, alert, no acute distress. HEENT:  Oral mucosa is pink and moist.  Edentulous.  Hearing decreased. Nares patent. NECK:  Supple without JVD or lymphadenopathy. LUNGS:  Show some crackles at bases, otherwise clear bilaterally. HEART:  Shows regular rate and rhythm without murmurs, gallops, or rubs. ABDOMEN:  Soft and nontender with positive bowel sounds.  EXTREMITIES:  Show no evidence of clubbing, cyanosis, or edema. Ecchymotic areas, right forearm. NEUROLOGIC:  Cranial nerves II-XII notable for right central 7 with tongue deviation.  The patient with decreased hearing, sensation diminished to light touch on right side, although he does withdraw to pain on lower greater than upper extremity.  Strength in left upper and lower extremities is 2-3/5 perhaps depending on attention levels, unable to  elicit any active movements of right thumb or right leg.  The patient with right hemi-inattention on exam.  Speech is moderately, severely dysarthric and unintelligible.  He is able to follow some simple one- step command at 40-50% accuracy.  Judgment, orientation, memory are all impaired.  HOSPITAL COURSE:  [name] was admitted to rehab on April 21, 2011, for inpatient therapies to consist of PT, OT, and Speech Therapy at least 3 hours 5 days a week.  Past-admission, physiatrist, rehab RN, and therapy team have worked together to provide customized collaborative interdisciplinary care.  Rehab RN has worked with the patient on bowel and bladder program as well as safety plans for fall prevention.  A supervision was provided at meals initially as the patient on D3 diet with nectar liquids due to dysphagia issues as well as poor attention.  The patient's vitals were checked on b.i.d. basis and his blood pressures have been reasonably stable ranging from 100s to 120s systolic and 60s to 80s diastolic.  The rehab RN has worked with the patient on scheduled toileting to prevent incontinence issues.  A UA/UC was done past admission and the patient was noted to have 25,000 colonies of Staph in urine and was treated with 3-day course of Septra for this.  He was also noted to have microhematuria on April 27, 2011 due to UTI.  Repeat UA past-treatment on May 06, 2011, shows UA to be negative.  No evidence of hematuria and urine culture is negative. Labs done past admission on April 22, 2011, revealed hemoglobin 14.2, hematocrit 40.6, white count 10.5, platelets 217.  Lytes have been monitored as the patient initially on nectar liquids.  Currently, he has been advanced to thin liquids.  Most recent check of lytes from May 03, 2011, revealed sodium 139, potassium 4.4, chloride 102, CO2 28, BUN 20, creatinine 0.99, glucose 112.  As the patient's mentation improved, MBS was done  on April 29, 2011, and the patient's diet was advanced to D3 diet, thin liquids.  He does aspirate when pills are taken with thin liquids and the patient and family had been advised about taking pills with puree as this has been a chronic ongoing problem for the past few years.  The patient to continue on D3 diet for now until his full set dentures obtained.  The patient was noted to have issues with decreased attention as well as decreased energy levels.  His Zoloft has been tapered off during this stay.  His Remeron was changed to at bedtime. The patient was noted to have issues with insomnia and he was started on Seroquel at bedtime with improvement in his sleep-wake cycle.  The Ritalin was initiated to help with attention.  Currently, continues on 5 mg b.i.d. with much improvement in activation and distractibility.  During the patient's stay in rehab, weekly team conferences were held to monitor the patient's progress, set goals, as well as discuss barriers to discharge.  At time of admission, the patient was noted to have dysphagia with deficits in expressive and receptive language impacted by his decreased  attention, decreased recall, poor awareness, and poor problem solving.  The patient has made functional gains in his dysphasia and he is currently at modified independent level past setup for tolerating D3 diet with thin liquids as well as meds whole in puree.  He requires supervisory semantic cues to self-monitor for two-step commands and for basic expression due to his right inattention and apraxia.  He is showing good emergent awareness with some emerging anticipatory awareness.  Further followup, a Speech Therapy to continue past discharge.  OT has worked with the patient on self-care tasks.  At admission, the patient was at mod-assist to sit at the edge of bed with +2 assist to maintain upright stance throughout.  He was limited in his ability to carry out self-care task due  to his right visual deficits as well as hemiparesis and apraxia.  The patient has made steady progress during his stay.  He has gone from total-assist to min-assist overall except requiring max-assist for lower body dressing due to apraxia.  He is still requires increased time for all functional task and cues for safety.  He has developed some active range of motion in right upper extremity and requires cues to visually attend to right upper extremity for precise purposeful movements.  The patient does continue to be limited by distractibility both internally and externally.  Physical Therapy has worked with the patient on mobility.  The patient was limited by significant motor apraxia, significant delayed initiation of movement, right neglect with poor balance reaction and poor postural control.  The patient was at total-assist for transfers and max-assist for wheelchair mobility.  He has improved significantly in all aspects of mobility and is currently at min-assist for transfers, min-assist for wheelchair mobility, min-assist for ambulating short distances with rolling walker with physical therapist only.  He does continue to be limited by apraxia, decreased activity tolerance as well as right inattention.  Family education has been done with son regarding transfers and wheelchair mobility.  The patient's son has been advised and educated about not ambulating the patient at home due to his poor attention and poor safety with mobility.  Occupational Therapy and Speech Therapy had made multiple attempts to have the patient's family coming in for education needed for self-care tasks as well as cognitive issues.  Despite having multiple time schedule, son has not shown up for this.  He is aware of the patient requiring assistance with all self- care tasks and as well as has been advised regarding 24-hour supervision in assistance that will be needed past discharge.  Further followup; Home  Health, PT, OT, and Speech Therapies to be done by [facility] for 1 week, then the patient transition to outpatient physical therapy per family request.  On May 19, 2011, the patient is discharged to home.  DISCHARGE MEDICATIONS: 1. Ritalin 5 mg one p.o. q.8 a.m. and noon daily, #60 Rx with     prescriptions for 2 months given. 2. Seroquel 12.5 mg p.o. at bedtime p.r.n. insomnia. 3. Lisinopril 10 mg p.o. per day. 4. Remeron 30 mg p.o. at bedtime. 5. Omeprazole 20 mg per day. 6. Symbicort 160-4.5 mcg two puffs b.i.d. 7. Xanax 0.5 mg p.o. per day p.r.n. anxiety. 8. Biotene oral rinse b.i.d. past meals.  Diet is D3 diet with thin liquids.  Meds to be administered whole in puree to avoid aspiration.  Activity level is 24 hours assistance, no strenuous activity, no walking except with physical therapist.  SPECIAL INSTRUCTIONS:  Toilet every 3-4 hours for continence.  A soft diet until dentures available.  [facility] to provide PT, OT, speech therapy, and RN.  Outpatient Neuro rehab to begin May 25, 2011 at 1:15.  FOLLOWUP:  The patient to follow up with Dr. Wynn Banker on June 29, 2011 at 11 a.m. for 11:40.  Follow up with Dr. Vernon Prey for appointment in 2 weeks.  Follow up with Dr. Pearlean Brownie for routine appointment in 4 weeks.     Delle Reining, P.A.   ______________________________ [name], M.D.    PL/MEDQ  D:  05/19/2011  T:  05/20/2011  Job:  [id number]  cc:   Ernestina Penna, M.D. Pramod P. Pearlean Brownie, MD  Electronically Signed by Osvaldo Shipper. on 05/28/2011 03:02:16 PM Electronically Signed by Claudette Laws M.D. on 05/31/2011 11:26:31 AM

## 2011-06-01 ENCOUNTER — Ambulatory Visit: Payer: Medicare Other | Attending: Physical Medicine & Rehabilitation | Admitting: Physical Therapy

## 2011-06-01 ENCOUNTER — Ambulatory Visit: Payer: Medicare Other | Admitting: Occupational Therapy

## 2011-06-01 DIAGNOSIS — I69998 Other sequelae following unspecified cerebrovascular disease: Secondary | ICD-10-CM | POA: Insufficient documentation

## 2011-06-01 DIAGNOSIS — I69920 Aphasia following unspecified cerebrovascular disease: Secondary | ICD-10-CM | POA: Insufficient documentation

## 2011-06-01 DIAGNOSIS — Z5189 Encounter for other specified aftercare: Secondary | ICD-10-CM | POA: Insufficient documentation

## 2011-06-01 DIAGNOSIS — R279 Unspecified lack of coordination: Secondary | ICD-10-CM | POA: Insufficient documentation

## 2011-06-01 DIAGNOSIS — I69919 Unspecified symptoms and signs involving cognitive functions following unspecified cerebrovascular disease: Secondary | ICD-10-CM | POA: Insufficient documentation

## 2011-06-01 DIAGNOSIS — R269 Unspecified abnormalities of gait and mobility: Secondary | ICD-10-CM | POA: Insufficient documentation

## 2011-06-01 DIAGNOSIS — M6281 Muscle weakness (generalized): Secondary | ICD-10-CM | POA: Insufficient documentation

## 2011-06-02 ENCOUNTER — Ambulatory Visit: Payer: Medicare Other | Admitting: Occupational Therapy

## 2011-06-02 ENCOUNTER — Ambulatory Visit: Payer: Medicare Other | Admitting: Physical Therapy

## 2011-06-04 ENCOUNTER — Ambulatory Visit: Payer: Medicare Other | Admitting: Occupational Therapy

## 2011-06-04 ENCOUNTER — Ambulatory Visit: Payer: Medicare Other | Admitting: Physical Therapy

## 2011-06-04 ENCOUNTER — Ambulatory Visit: Payer: Medicare Other

## 2011-06-08 ENCOUNTER — Ambulatory Visit: Payer: Medicare Other | Admitting: Occupational Therapy

## 2011-06-08 ENCOUNTER — Ambulatory Visit: Payer: Medicare Other | Admitting: Physical Therapy

## 2011-06-09 ENCOUNTER — Ambulatory Visit: Payer: Medicare Other | Admitting: Physical Therapy

## 2011-06-09 ENCOUNTER — Ambulatory Visit: Payer: Medicare Other | Admitting: *Deleted

## 2011-06-09 ENCOUNTER — Ambulatory Visit: Payer: Medicare Other | Admitting: Occupational Therapy

## 2011-06-11 ENCOUNTER — Ambulatory Visit: Payer: Medicare Other | Admitting: Physical Therapy

## 2011-06-11 ENCOUNTER — Ambulatory Visit: Payer: Medicare Other | Admitting: Occupational Therapy

## 2011-06-15 ENCOUNTER — Ambulatory Visit: Payer: Medicare Other | Admitting: *Deleted

## 2011-06-15 ENCOUNTER — Ambulatory Visit: Payer: Medicare Other | Admitting: Physical Therapy

## 2011-06-16 ENCOUNTER — Ambulatory Visit: Payer: Medicare Other | Admitting: Physical Therapy

## 2011-06-16 ENCOUNTER — Ambulatory Visit: Payer: Medicare Other | Admitting: *Deleted

## 2011-06-21 ENCOUNTER — Ambulatory Visit: Payer: Medicare Other | Admitting: *Deleted

## 2011-06-21 ENCOUNTER — Ambulatory Visit: Payer: Medicare Other | Admitting: Physical Therapy

## 2011-06-21 ENCOUNTER — Ambulatory Visit: Payer: Medicare Other

## 2011-06-23 ENCOUNTER — Ambulatory Visit: Payer: Medicare Other | Admitting: Physical Therapy

## 2011-06-23 ENCOUNTER — Ambulatory Visit: Payer: Medicare Other

## 2011-06-23 ENCOUNTER — Ambulatory Visit: Payer: Medicare Other | Admitting: *Deleted

## 2011-06-25 ENCOUNTER — Ambulatory Visit: Payer: Medicare Other | Admitting: *Deleted

## 2011-06-25 ENCOUNTER — Ambulatory Visit: Payer: Medicare Other

## 2011-06-25 ENCOUNTER — Ambulatory Visit: Payer: Medicare Other | Admitting: Physical Therapy

## 2011-06-28 ENCOUNTER — Encounter: Payer: Medicare Other | Admitting: *Deleted

## 2011-06-28 ENCOUNTER — Ambulatory Visit: Payer: Medicare Other | Admitting: Physical Therapy

## 2011-06-28 ENCOUNTER — Encounter: Payer: Medicare Other | Admitting: Occupational Therapy

## 2011-06-29 ENCOUNTER — Encounter: Payer: Medicare Other | Attending: Physical Medicine & Rehabilitation

## 2011-06-29 ENCOUNTER — Inpatient Hospital Stay: Payer: Medicare Other | Admitting: Physical Medicine & Rehabilitation

## 2011-06-29 DIAGNOSIS — I69993 Ataxia following unspecified cerebrovascular disease: Secondary | ICD-10-CM

## 2011-06-29 DIAGNOSIS — R29898 Other symptoms and signs involving the musculoskeletal system: Secondary | ICD-10-CM | POA: Insufficient documentation

## 2011-06-29 DIAGNOSIS — G8110 Spastic hemiplegia affecting unspecified side: Secondary | ICD-10-CM

## 2011-06-29 DIAGNOSIS — R482 Apraxia: Secondary | ICD-10-CM | POA: Insufficient documentation

## 2011-06-29 DIAGNOSIS — J449 Chronic obstructive pulmonary disease, unspecified: Secondary | ICD-10-CM | POA: Insufficient documentation

## 2011-06-29 DIAGNOSIS — J4489 Other specified chronic obstructive pulmonary disease: Secondary | ICD-10-CM | POA: Insufficient documentation

## 2011-06-29 DIAGNOSIS — I69920 Aphasia following unspecified cerebrovascular disease: Secondary | ICD-10-CM | POA: Insufficient documentation

## 2011-06-29 NOTE — Assessment & Plan Note (Signed)
HISTORY:  A 77 year old male with history hypertension, ophthalmic artery aneurysm with coiling found down with right-sided weakness. Around April 23, 2011, head CT showed a left parietal intracerebral hemorrhage with surrounding hypodensity.  Large lobar hemorrhage parietal lobe on the left side.  A 2D echo showed no evidence of embolic source.  Dopplers of the carotids showed no ICA stenosis.  He had dysarthria, a mixed aphasia and a right hemiparesis as well as apraxia. He went through inpatient rehab from April 21, 2011 to May 19, 2011.  He was discharged to his son's home.  He resides there.  He was admitted to the hospital for about 2 days for bronchitis.  Just got out yesterday.  He continues with outpatient therapy PT, OT, and speech.  Per son he is bathing himself, dressing himself with adaptive equipment such as a tub transfer bench.  He ambulates with a walker when he gets out side the home, but within the home he can ambulate with supervision. His pain is about 3/10, mainly arthritis in his thumbs.  REVIEW OF SYSTEMS:  Numbness on the right side as well as trouble walking.  He does have shortness of breath at times.  SOCIAL HISTORY:  Widowed, nonsmoker, nondrinker.  OBJECTIVE:  VITAL SIGNS:  Blood pressure 129/77, pulse 98, respirations 16, and O2 saturation 97% on room air. GENERAL:  No acute stress, orientation x3, height 6 feet 0, weight 167 pounds. MUSCULOSKELETAL:  His strength on the right side is 5-/5.  Left side is 5/5.  On the right lower extremity is 4-/5 in hip flexion, knee extensor, ankle dorsiflexor, and 5/5 on the left side.  Deep tendon reflexes are normal in left upper as well as left lower extremity, and hyperreflexic in the right knee jerk and normal reflexes in the right upper extremity.  Speech, has occasional word-finding deficits.  Appears to have good comprehensive, however, he does still show signs of ideomotor apraxia during manual  muscle testing, pushing out when I am asking the pool in and needs gestural cues for this.  His gait, he does have a widened base support, some valgus deformity at the right knee, he does status post TKR on that side.  He has no evidence of toe drag.  IMPRESSION: 1. Left parietal hemorrhage causing right-sided weakness, mainly     apparent in the right lower extremity at this point. 2. 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 3. Aphasia improving secondary to above. 4. Chronic obstructive pulmonary disease.  Following up with primary     care on this.  PLAN: 1. Continue outpatient therapy. 2. I will see him back in 1 month. 3. Reduce Ritalin to 5 mg in the morning, and if he is doing well,     next month I will discontinue it.     Erick Colace, M.D. Electronically Signed    AEK/MedQ D:  06/29/2011 11:52:20  T:  06/29/2011 12:55:32  Job #:  [id number]  cc:   Pramod P. Pearlean Brownie, MD Fax: [phone]  Ernestina Penna, M.D. Fax: [phone]

## 2011-06-30 ENCOUNTER — Encounter: Payer: Medicare Other | Admitting: *Deleted

## 2011-06-30 ENCOUNTER — Ambulatory Visit: Payer: Medicare Other | Admitting: Physical Therapy

## 2011-07-05 ENCOUNTER — Ambulatory Visit: Payer: Medicare Other | Attending: Physical Medicine & Rehabilitation | Admitting: *Deleted

## 2011-07-05 ENCOUNTER — Ambulatory Visit: Payer: Medicare Other | Admitting: Physical Therapy

## 2011-07-05 ENCOUNTER — Ambulatory Visit: Payer: Medicare Other

## 2011-07-05 DIAGNOSIS — Z5189 Encounter for other specified aftercare: Secondary | ICD-10-CM | POA: Insufficient documentation

## 2011-07-05 DIAGNOSIS — I69919 Unspecified symptoms and signs involving cognitive functions following unspecified cerebrovascular disease: Secondary | ICD-10-CM | POA: Insufficient documentation

## 2011-07-05 DIAGNOSIS — I69920 Aphasia following unspecified cerebrovascular disease: Secondary | ICD-10-CM | POA: Insufficient documentation

## 2011-07-05 DIAGNOSIS — I69998 Other sequelae following unspecified cerebrovascular disease: Secondary | ICD-10-CM | POA: Insufficient documentation

## 2011-07-05 DIAGNOSIS — R269 Unspecified abnormalities of gait and mobility: Secondary | ICD-10-CM | POA: Insufficient documentation

## 2011-07-05 DIAGNOSIS — M6281 Muscle weakness (generalized): Secondary | ICD-10-CM | POA: Insufficient documentation

## 2011-07-05 DIAGNOSIS — R279 Unspecified lack of coordination: Secondary | ICD-10-CM | POA: Insufficient documentation

## 2011-07-07 ENCOUNTER — Ambulatory Visit: Payer: Medicare Other

## 2011-07-07 ENCOUNTER — Ambulatory Visit: Payer: Medicare Other | Admitting: Physical Therapy

## 2011-07-07 ENCOUNTER — Ambulatory Visit: Payer: Medicare Other | Admitting: *Deleted

## 2011-07-09 ENCOUNTER — Ambulatory Visit: Payer: Medicare Other

## 2011-07-09 ENCOUNTER — Ambulatory Visit: Payer: Medicare Other | Admitting: Physical Therapy

## 2011-07-09 ENCOUNTER — Encounter: Payer: Medicare Other | Admitting: *Deleted

## 2011-07-12 ENCOUNTER — Encounter: Payer: Medicare Other | Admitting: *Deleted

## 2011-07-13 ENCOUNTER — Ambulatory Visit: Payer: Medicare Other | Admitting: Physical Therapy

## 2011-07-13 ENCOUNTER — Ambulatory Visit: Payer: Medicare Other | Admitting: *Deleted

## 2011-07-13 ENCOUNTER — Ambulatory Visit: Payer: Medicare Other

## 2011-07-15 ENCOUNTER — Ambulatory Visit: Payer: Medicare Other

## 2011-07-15 ENCOUNTER — Ambulatory Visit: Payer: Medicare Other | Admitting: Occupational Therapy

## 2011-07-15 ENCOUNTER — Ambulatory Visit: Payer: Medicare Other | Admitting: Physical Therapy

## 2011-07-26 ENCOUNTER — Ambulatory Visit: Payer: Medicare Other | Admitting: Speech Pathology

## 2011-07-26 ENCOUNTER — Ambulatory Visit: Payer: Medicare Other | Admitting: Physical Therapy

## 2011-07-30 ENCOUNTER — Ambulatory Visit: Payer: Medicare Other

## 2011-07-30 ENCOUNTER — Ambulatory Visit: Payer: Medicare Other | Attending: Physical Medicine & Rehabilitation | Admitting: Occupational Therapy

## 2011-07-30 ENCOUNTER — Ambulatory Visit: Payer: Medicare Other | Admitting: Physical Therapy

## 2011-07-30 DIAGNOSIS — I69920 Aphasia following unspecified cerebrovascular disease: Secondary | ICD-10-CM | POA: Insufficient documentation

## 2011-07-30 DIAGNOSIS — Z5189 Encounter for other specified aftercare: Secondary | ICD-10-CM | POA: Insufficient documentation

## 2011-07-30 DIAGNOSIS — R269 Unspecified abnormalities of gait and mobility: Secondary | ICD-10-CM | POA: Insufficient documentation

## 2011-07-30 DIAGNOSIS — I69998 Other sequelae following unspecified cerebrovascular disease: Secondary | ICD-10-CM | POA: Insufficient documentation

## 2011-07-30 DIAGNOSIS — R279 Unspecified lack of coordination: Secondary | ICD-10-CM | POA: Insufficient documentation

## 2011-07-30 DIAGNOSIS — I69919 Unspecified symptoms and signs involving cognitive functions following unspecified cerebrovascular disease: Secondary | ICD-10-CM | POA: Insufficient documentation

## 2011-07-30 DIAGNOSIS — M6281 Muscle weakness (generalized): Secondary | ICD-10-CM | POA: Insufficient documentation

## 2011-08-02 ENCOUNTER — Ambulatory Visit: Payer: Medicare Other | Admitting: *Deleted

## 2011-08-02 ENCOUNTER — Ambulatory Visit: Payer: Medicare Other | Admitting: Physical Therapy

## 2011-08-02 ENCOUNTER — Ambulatory Visit: Payer: Medicare Other | Admitting: Speech Pathology

## 2011-08-03 ENCOUNTER — Ambulatory Visit: Payer: Medicare Other | Admitting: Physical Medicine & Rehabilitation

## 2011-08-03 ENCOUNTER — Encounter: Payer: Medicare Other | Attending: Physical Medicine & Rehabilitation

## 2011-08-03 DIAGNOSIS — G8110 Spastic hemiplegia affecting unspecified side: Secondary | ICD-10-CM

## 2011-08-03 DIAGNOSIS — J449 Chronic obstructive pulmonary disease, unspecified: Secondary | ICD-10-CM | POA: Insufficient documentation

## 2011-08-03 DIAGNOSIS — R29898 Other symptoms and signs involving the musculoskeletal system: Secondary | ICD-10-CM | POA: Insufficient documentation

## 2011-08-03 DIAGNOSIS — I69920 Aphasia following unspecified cerebrovascular disease: Secondary | ICD-10-CM | POA: Insufficient documentation

## 2011-08-03 DIAGNOSIS — J4489 Other specified chronic obstructive pulmonary disease: Secondary | ICD-10-CM | POA: Insufficient documentation

## 2011-08-03 DIAGNOSIS — R482 Apraxia: Secondary | ICD-10-CM | POA: Insufficient documentation

## 2011-08-03 NOTE — Assessment & Plan Note (Signed)
REASON FOR VISIT:  Left intracranial hemorrhage causing right-sided weakness, aphasia.  HISTORY:  A 77 year old male who was functionally independent prior to left parietal intracerebral hemorrhage onset on April 15, 2011.  He had a large lobar hemorrhage on MRI as well as occipital lobe infarct on the right side.  No surgical intervention was needed.  He has expressive and receptive aphasia.  He had initially right flaccid hemiparesis, right inattention apraxia, but this has improved to a great degree.  He is now receiving outpatient therapy, PT/OT speech reduced to twice a week.  He is able to bathe himself, dress himself.  He ambulates with a walker outside the home, but in the home, just ambulates with supervision without assistive device.  3/10 pain mainly in the thumb due to arthritis, walking tolerance 15-20 minutes.  He will climb steps.  He does not drive.  He needs help with certain household duties and shopping.  He has some numbness in the right forearm due to CVA.  PAST SURGICAL HISTORY:  Right knee replacement, he has some valgus deformity.  SOCIAL HISTORY:  Widowed, lives with his son and his son is somewhat frustrated thinks that his father does not really try hard enough when he sees a doctor, does better at home then in doctor's office with functional activities.  OBJECTIVE:  VITAL SIGNS:  Blood pressure 113/73, pulse 84, respirations 16, O2 sat 93% on room air, weight 163 pounds. MUSCULOSKELETAL:  He has 5/5 strength bilaterally in the deltoid biceps, triceps, grip.  He has 4+ in the right hip flexor, knee extensor, ankle dorsiflexor, 5/5 on left side.  Deep tendon reflexes, hyperreflexia in the right knee jerk otherwise normal on the right side. NEUROLOGIC:  Speech has occasional word-finding deficits.  He is able to repeat phrases such as, no if's, and's or but's.  He appears to have some good comprehension.  He is doing better with manual muscle  testing, less evidence of apraxia.  On gait he has a widened base support, but this is mainly due to valgus deformity, right knee.  IMPRESSION:  Left parietal hemorrhage causing right-sided weakness mainly apparent right lower extremity at this point and overall improving. 1. Ideomotor apraxia, improving. 2. Aphasia, improving.  PLAN:  Continue outpatient therapy.  I will see him back in 2 months, no driving.  Still needs to stay with family, but may be able to get back to independent level or intermittent supervision.     Erick Colace, M.D. Electronically Signed    AEK/MedQ D:  08/03/2011 10:49:52  T:  08/03/2011 20:59:57  Job #:  [id number]

## 2011-08-05 ENCOUNTER — Ambulatory Visit: Payer: Medicare Other | Admitting: Physical Therapy

## 2011-08-05 ENCOUNTER — Ambulatory Visit: Payer: Medicare Other | Admitting: *Deleted

## 2011-08-05 ENCOUNTER — Ambulatory Visit: Payer: Medicare Other | Admitting: Occupational Therapy

## 2011-08-09 ENCOUNTER — Encounter: Payer: Medicare Other | Admitting: Speech Pathology

## 2011-08-09 ENCOUNTER — Ambulatory Visit: Payer: Medicare Other | Admitting: Physical Therapy

## 2011-08-09 ENCOUNTER — Encounter: Payer: Medicare Other | Admitting: *Deleted

## 2011-08-11 ENCOUNTER — Ambulatory Visit: Payer: Medicare Other | Admitting: Occupational Therapy

## 2011-08-11 ENCOUNTER — Ambulatory Visit: Payer: Medicare Other

## 2011-08-11 ENCOUNTER — Ambulatory Visit: Payer: Medicare Other | Admitting: Physical Therapy

## 2011-08-16 ENCOUNTER — Ambulatory Visit: Payer: Medicare Other

## 2011-08-16 ENCOUNTER — Ambulatory Visit: Payer: Medicare Other | Admitting: Physical Therapy

## 2011-08-16 ENCOUNTER — Ambulatory Visit: Payer: Medicare Other | Admitting: *Deleted

## 2011-08-18 ENCOUNTER — Ambulatory Visit: Payer: Medicare Other | Admitting: Physical Therapy

## 2011-08-18 ENCOUNTER — Encounter: Payer: Medicare Other | Admitting: Occupational Therapy

## 2011-08-19 ENCOUNTER — Ambulatory Visit: Payer: Medicare Other

## 2011-08-19 ENCOUNTER — Ambulatory Visit: Payer: Medicare Other | Admitting: Physical Therapy

## 2011-08-19 ENCOUNTER — Ambulatory Visit: Payer: Medicare Other | Admitting: Occupational Therapy

## 2011-08-24 ENCOUNTER — Ambulatory Visit: Payer: Medicare Other | Admitting: Physical Therapy

## 2011-08-24 ENCOUNTER — Ambulatory Visit: Payer: Medicare Other | Admitting: Occupational Therapy

## 2011-08-26 ENCOUNTER — Ambulatory Visit: Payer: Medicare Other | Admitting: Physical Therapy

## 2011-08-26 ENCOUNTER — Ambulatory Visit: Payer: Medicare Other

## 2011-08-26 ENCOUNTER — Ambulatory Visit: Payer: Medicare Other | Admitting: Occupational Therapy

## 2011-08-31 ENCOUNTER — Ambulatory Visit: Payer: Medicare Other | Admitting: Occupational Therapy

## 2011-08-31 ENCOUNTER — Ambulatory Visit: Payer: Medicare Other | Attending: Physical Medicine & Rehabilitation | Admitting: Physical Therapy

## 2011-08-31 DIAGNOSIS — M6281 Muscle weakness (generalized): Secondary | ICD-10-CM | POA: Insufficient documentation

## 2011-08-31 DIAGNOSIS — I69998 Other sequelae following unspecified cerebrovascular disease: Secondary | ICD-10-CM | POA: Insufficient documentation

## 2011-08-31 DIAGNOSIS — Z5189 Encounter for other specified aftercare: Secondary | ICD-10-CM | POA: Insufficient documentation

## 2011-08-31 DIAGNOSIS — I69919 Unspecified symptoms and signs involving cognitive functions following unspecified cerebrovascular disease: Secondary | ICD-10-CM | POA: Insufficient documentation

## 2011-08-31 DIAGNOSIS — R269 Unspecified abnormalities of gait and mobility: Secondary | ICD-10-CM | POA: Insufficient documentation

## 2011-08-31 DIAGNOSIS — R279 Unspecified lack of coordination: Secondary | ICD-10-CM | POA: Insufficient documentation

## 2011-08-31 DIAGNOSIS — I69920 Aphasia following unspecified cerebrovascular disease: Secondary | ICD-10-CM | POA: Insufficient documentation

## 2011-09-02 ENCOUNTER — Ambulatory Visit: Payer: Medicare Other | Admitting: Physical Therapy

## 2011-09-02 ENCOUNTER — Ambulatory Visit: Payer: Medicare Other | Admitting: Occupational Therapy

## 2011-09-07 ENCOUNTER — Ambulatory Visit: Payer: Medicare Other | Admitting: Occupational Therapy

## 2011-09-07 ENCOUNTER — Ambulatory Visit: Payer: Medicare Other | Admitting: Physical Therapy

## 2011-09-09 ENCOUNTER — Ambulatory Visit: Payer: Medicare Other | Admitting: Occupational Therapy

## 2011-09-09 ENCOUNTER — Ambulatory Visit: Payer: Medicare Other | Admitting: Physical Therapy

## 2011-09-14 ENCOUNTER — Ambulatory Visit: Payer: Medicare Other | Admitting: Occupational Therapy

## 2011-09-14 ENCOUNTER — Ambulatory Visit: Payer: Medicare Other | Admitting: Physical Therapy

## 2011-09-16 ENCOUNTER — Ambulatory Visit: Payer: Medicare Other | Admitting: Physical Therapy

## 2011-09-16 ENCOUNTER — Encounter: Payer: Medicare Other | Admitting: Occupational Therapy

## 2011-09-28 ENCOUNTER — Ambulatory Visit: Payer: Medicare Other | Admitting: Physical Medicine & Rehabilitation

## 2011-09-28 ENCOUNTER — Encounter: Payer: Medicare Other | Attending: Physical Medicine & Rehabilitation

## 2011-09-28 DIAGNOSIS — J449 Chronic obstructive pulmonary disease, unspecified: Secondary | ICD-10-CM | POA: Insufficient documentation

## 2011-09-28 DIAGNOSIS — I69920 Aphasia following unspecified cerebrovascular disease: Secondary | ICD-10-CM | POA: Insufficient documentation

## 2011-09-28 DIAGNOSIS — J4489 Other specified chronic obstructive pulmonary disease: Secondary | ICD-10-CM | POA: Insufficient documentation

## 2011-09-28 DIAGNOSIS — R29898 Other symptoms and signs involving the musculoskeletal system: Secondary | ICD-10-CM | POA: Insufficient documentation

## 2011-09-28 DIAGNOSIS — R482 Apraxia: Secondary | ICD-10-CM | POA: Insufficient documentation

## 2012-01-28 IMAGING — CR DG CHEST 2V
3 series · 3 of 3 positions shown · non-contrast
Comparison: CT abdomen 10/26/2010.

CLINICAL DATA: 80-year-old male with wheezing, cough.

CHEST - 2 VIEW

[view not recorded (1 of 3)]
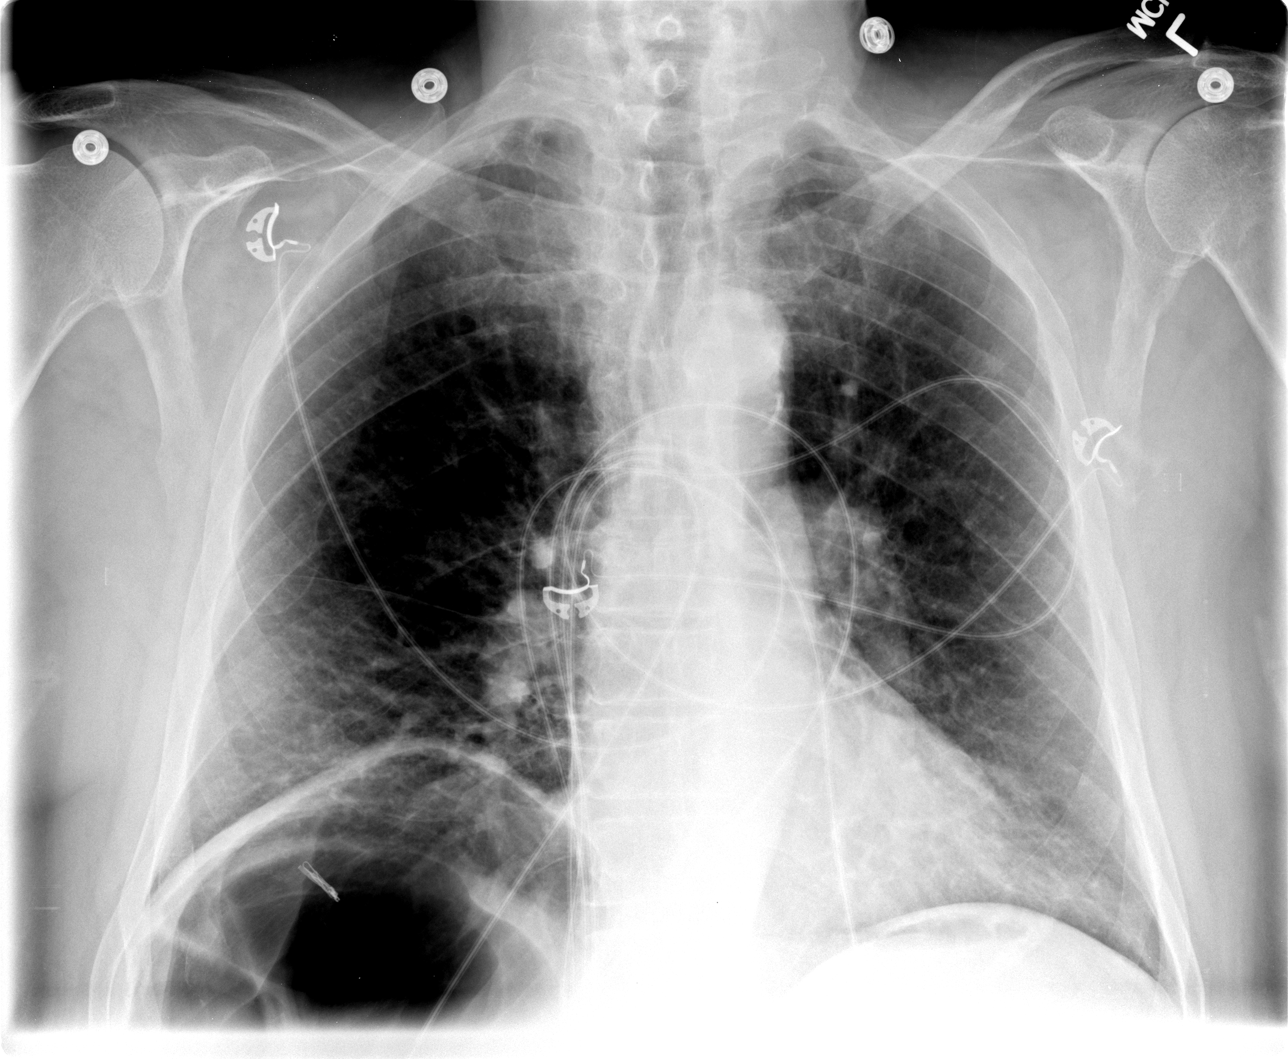

[view not recorded (2 of 3)]
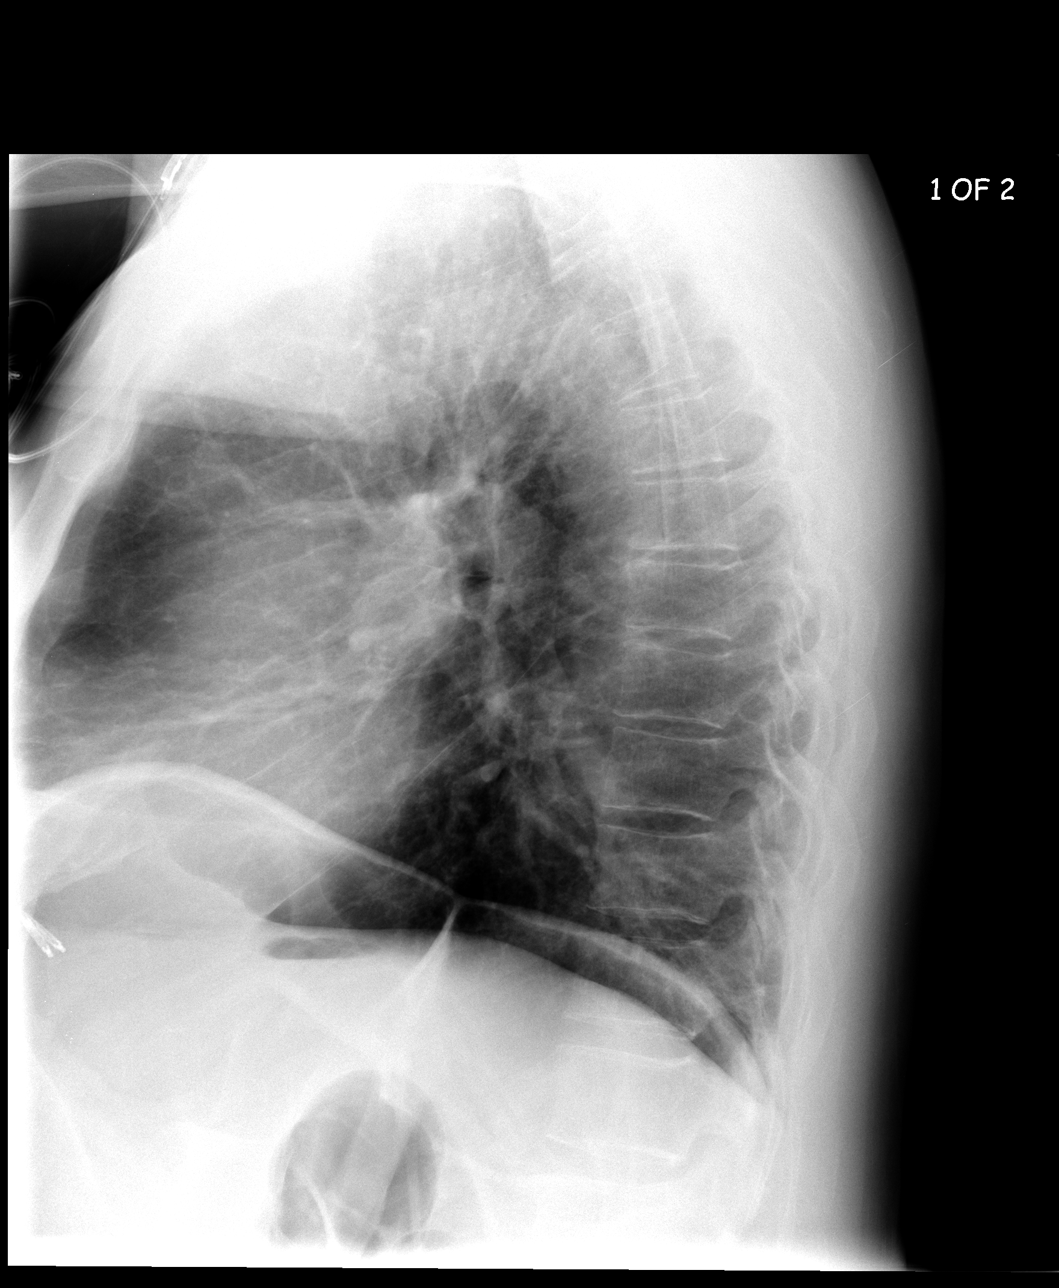

[view not recorded (3 of 3)]
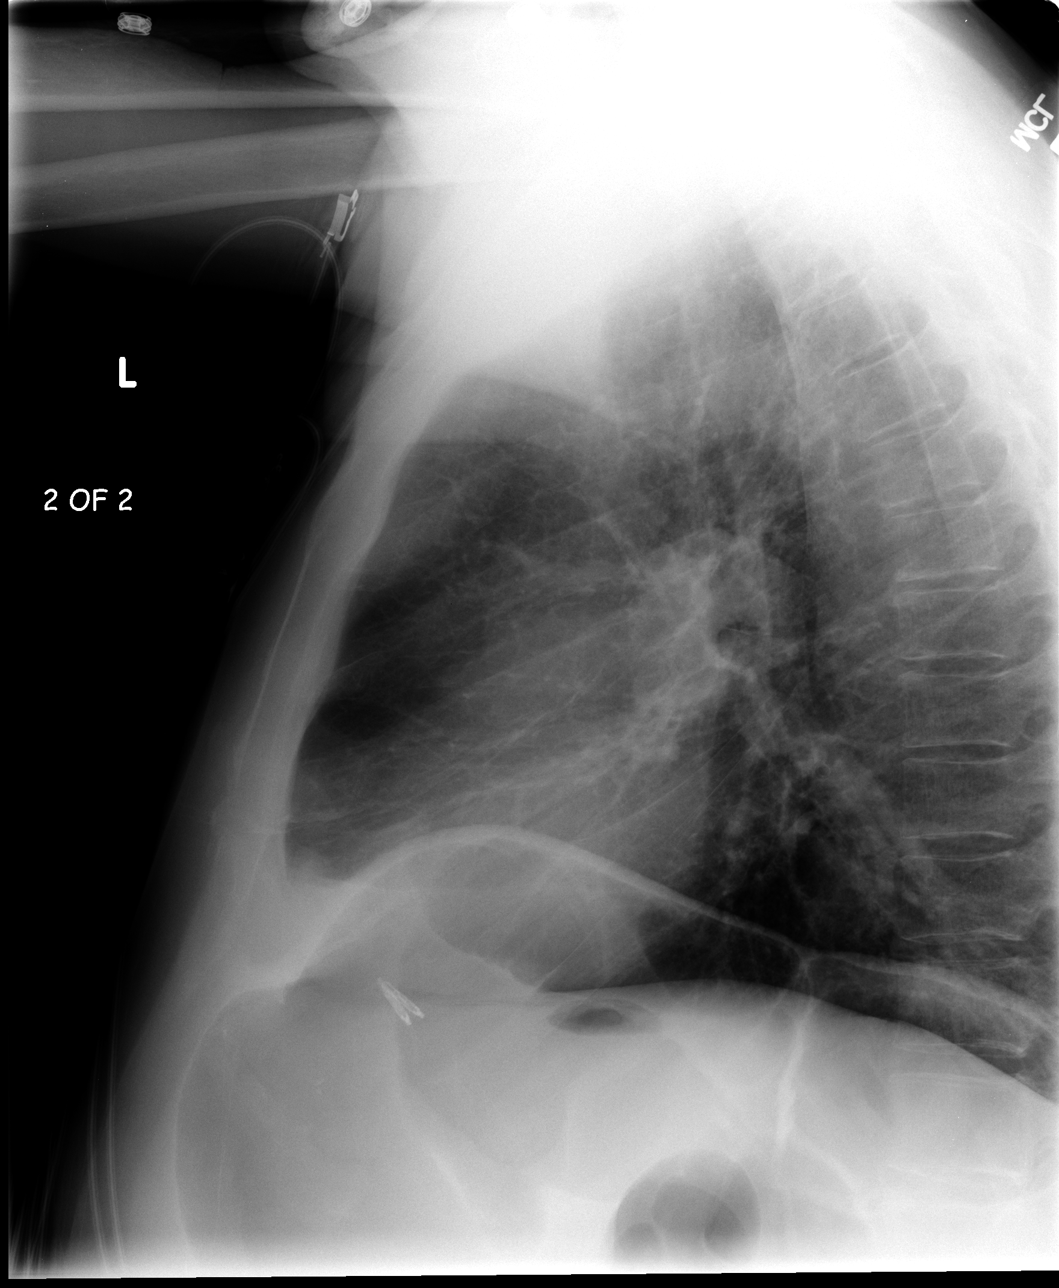

[3 of 3 positions shown; findings below may reference images not displayed]

FINDINGS: Mild elevation of the right hemidiaphragm with subjacent
gas filled colon.  Mild diffuse increased interstitial markings.
Cardiac size and mediastinal contours are within normal limits.
Visualized tracheal air column is within normal limits.  No
pneumothorax, pulmonary edema, pleural effusion or confluent
pulmonary opacity. No acute osseous abnormality identified.
IMPRESSION: Chronic-appearing pulmonary interstitial changes. No acute
cardiopulmonary abnormality.

## 2012-03-16 ENCOUNTER — Ambulatory Visit (INDEPENDENT_AMBULATORY_CARE_PROVIDER_SITE_OTHER): Payer: Medicare Other | Admitting: Critical Care Medicine

## 2012-03-16 ENCOUNTER — Encounter: Payer: Self-pay | Admitting: Critical Care Medicine

## 2012-03-16 VITALS — BP 120/62 | HR 95 | Temp 97.7°F | Ht 73.0 in | Wt 168.0 lb

## 2012-03-16 DIAGNOSIS — J449 Chronic obstructive pulmonary disease, unspecified: Secondary | ICD-10-CM

## 2012-03-16 MED ORDER — BUDESONIDE-FORMOTEROL FUMARATE 160-4.5 MCG/ACT IN AERO: 2.0000 | INHALATION_SPRAY | Freq: Two times a day (BID) | RESPIRATORY_TRACT | Status: DC

## 2012-03-16 NOTE — Progress Notes (Signed)
Subjective:    Patient ID: [name], male    DOB: [birth year], 78 y.o.   MRN: [id number]  HPI .   03/16/2012 Pt had severe CVA on R side.  Pt gets imbalanced.  Now lives with son.  No walker in a while. Dyspnea is the same.  Occ cough. One spell of ?PNA fall of 2012. CAT 4 today No chest pain.   Pt denies any significant sore throat, nasal congestion or excess secretions, fever, chills, sweats, unintended weight loss, pleurtic or exertional chest pain, orthopnea PND, or leg swelling Pt denies any increase in rescue therapy over baseline, denies waking up needing it or having any early am or nocturnal exacerbations of coughing/wheezing/or dyspnea. Pt also denies any obvious fluctuation in symptoms with  weather or environmental change or other alleviating or aggravating factors      Review of Systems 11 pt Ros neg    Objective:   Physical Exam  Filed Vitals:   03/16/12 1625  BP: 120/62  Pulse: 95  Temp: 97.7 F (36.5 C)  TempSrc: Oral  Height: 6\' 1"  (1.854 m)  Weight: 76.204 kg (168 lb)  SpO2: 65%    Gen: Pleasant, well-nourished, in no distress,  normal affect  ENT: No lesions,  mouth clear,  oropharynx clear, no postnasal drip  Neck: No JVD, no TMG, no carotid bruits  Lungs: No use of accessory muscles, no dullness to percussion, distant BS  Cardiovascular: RRR, heart sounds normal, no murmur or gallops, no peripheral edema  Abdomen: soft and NT, no HSM,  BS normal  Musculoskeletal: No deformities, no cyanosis or clubbing  Neuro: alert, non focal  Skin: Warm, no lesions or rashes  No results found.       Assessment & Plan:   COPD Golds C Copd stable at this time Plan No change in inhaled or maintenance medications. Return in  6 months   Updated Medication List Outpatient Encounter Prescriptions as of 03/16/2012  Medication Sig Dispense Refill  . Albuterol Sulfate (VENTOLIN HFA IN) Inhale 2 puffs into the lungs as needed.        .  budesonide-formoterol (SYMBICORT) 160-4.5 MCG/ACT inhaler Inhale 2 puffs into the lungs 2 (two) times daily.  1 Inhaler  12  . lisinopril (PRINIVIL,ZESTRIL) 10 MG tablet Take 10 mg by mouth daily.      . mirtazapine (REMERON) 30 MG tablet Take 30 mg by mouth at bedtime.        Marland Kitchen omeprazole (PRILOSEC) 20 MG capsule Take 20 mg by mouth daily.        . Vitamin D, Ergocalciferol, (DRISDOL) 50000 UNITS CAPS Take 50,000 Units by mouth every 7 (seven) days.      Marland Kitchen DISCONTD: budesonide-formoterol (SYMBICORT) 160-4.5 MCG/ACT inhaler Inhale 2 puffs into the lungs 2 (two) times daily.        Marland Kitchen DISCONTD: ALPRAZolam (XANAX) 0.5 MG tablet Take 0.5 mg by mouth 3 (three) times daily as needed.        Marland Kitchen DISCONTD: ARIPiprazole (ABILIFY) 2 MG tablet Take 2 mg by mouth daily.        Marland Kitchen DISCONTD: aspirin 81 MG tablet Take 81 mg by mouth daily.        Marland Kitchen DISCONTD: Cholecalciferol (VITAMIN D3) 2000 UNITS TABS Take 1 tablet by mouth daily.        Marland Kitchen DISCONTD: cyanocobalamin (,VITAMIN B-12,) 1000 MCG/ML injection Inject 1,000 mcg into the muscle every 30 (thirty) days.        Marland Kitchen  DISCONTD: methylphenidate (RITALIN) 5 MG tablet Take 5 mg by mouth 2 (two) times daily. PO 8 AM and NOON      . DISCONTD: sertraline (ZOLOFT) 100 MG tablet Take 100 mg by mouth daily.        Marland Kitchen DISCONTD: venlafaxine (EFFEXOR-XR) 150 MG 24 hr capsule Take 150 mg by mouth daily.

## 2012-03-16 NOTE — Patient Instructions (Addendum)
No change in medications. Return in        6 months        

## 2012-03-17 NOTE — Assessment & Plan Note (Signed)
Golds C Copd stable at this time Plan No change in inhaled or maintenance medications. Return in  6 months

## 2012-03-24 ENCOUNTER — Institutional Professional Consult (permissible substitution): Payer: Medicare Other | Admitting: Emergency Medicine

## 2012-08-02 ENCOUNTER — Encounter (HOSPITAL_BASED_OUTPATIENT_CLINIC_OR_DEPARTMENT_OTHER): Payer: Medicare Other

## 2012-08-28 ENCOUNTER — Telehealth: Payer: Self-pay | Admitting: Critical Care Medicine

## 2012-08-28 NOTE — Telephone Encounter (Signed)
Called pt x 3 to make next appt per recall.  Left 3 messages and pt never called back.  Mailed recall letter 08/28/12. [name]  °

## 2012-09-06 ENCOUNTER — Encounter (HOSPITAL_BASED_OUTPATIENT_CLINIC_OR_DEPARTMENT_OTHER): Payer: Medicare Other

## 2012-10-19 ENCOUNTER — Ambulatory Visit: Payer: Medicare Other | Admitting: Critical Care Medicine

## 2013-03-11 ENCOUNTER — Encounter (HOSPITAL_COMMUNITY): Payer: Self-pay | Admitting: Emergency Medicine

## 2013-03-11 ENCOUNTER — Emergency Department (HOSPITAL_COMMUNITY): Payer: Medicare Other

## 2013-03-11 ENCOUNTER — Inpatient Hospital Stay (HOSPITAL_COMMUNITY)
Admission: EM | Admit: 2013-03-11 | Discharge: 2013-03-15 | DRG: 871 | Disposition: A | Discharge status: Home Health | Payer: Medicare Other | Attending: Internal Medicine | Admitting: Internal Medicine

## 2013-03-11 DIAGNOSIS — N12 Tubulo-interstitial nephritis, not specified as acute or chronic: Secondary | ICD-10-CM | POA: Diagnosis present

## 2013-03-11 DIAGNOSIS — A498 Other bacterial infections of unspecified site: Secondary | ICD-10-CM | POA: Diagnosis present

## 2013-03-11 DIAGNOSIS — G9340 Encephalopathy, unspecified: Secondary | ICD-10-CM

## 2013-03-11 DIAGNOSIS — F329 Major depressive disorder, single episode, unspecified: Secondary | ICD-10-CM

## 2013-03-11 DIAGNOSIS — I959 Hypotension, unspecified: Secondary | ICD-10-CM | POA: Diagnosis present

## 2013-03-11 DIAGNOSIS — G9341 Metabolic encephalopathy: Secondary | ICD-10-CM | POA: Diagnosis present

## 2013-03-11 DIAGNOSIS — Z87891 Personal history of nicotine dependence: Secondary | ICD-10-CM

## 2013-03-11 DIAGNOSIS — J449 Chronic obstructive pulmonary disease, unspecified: Secondary | ICD-10-CM | POA: Diagnosis present

## 2013-03-11 DIAGNOSIS — Z8673 Personal history of transient ischemic attack (TIA), and cerebral infarction without residual deficits: Secondary | ICD-10-CM

## 2013-03-11 DIAGNOSIS — I671 Cerebral aneurysm, nonruptured: Secondary | ICD-10-CM

## 2013-03-11 DIAGNOSIS — C61 Malignant neoplasm of prostate: Secondary | ICD-10-CM

## 2013-03-11 DIAGNOSIS — K219 Gastro-esophageal reflux disease without esophagitis: Secondary | ICD-10-CM

## 2013-03-11 DIAGNOSIS — R197 Diarrhea, unspecified: Secondary | ICD-10-CM | POA: Diagnosis not present

## 2013-03-11 DIAGNOSIS — A4151 Sepsis due to Escherichia coli [E. coli]: Principal | ICD-10-CM | POA: Diagnosis present

## 2013-03-11 DIAGNOSIS — D519 Vitamin B12 deficiency anemia, unspecified: Secondary | ICD-10-CM

## 2013-03-11 DIAGNOSIS — N189 Chronic kidney disease, unspecified: Secondary | ICD-10-CM | POA: Diagnosis present

## 2013-03-11 DIAGNOSIS — N390 Urinary tract infection, site not specified: Secondary | ICD-10-CM | POA: Diagnosis present

## 2013-03-11 DIAGNOSIS — E871 Hypo-osmolality and hyponatremia: Secondary | ICD-10-CM | POA: Diagnosis present

## 2013-03-11 DIAGNOSIS — N281 Cyst of kidney, acquired: Secondary | ICD-10-CM

## 2013-03-11 DIAGNOSIS — A419 Sepsis, unspecified organism: Secondary | ICD-10-CM | POA: Diagnosis present

## 2013-03-11 DIAGNOSIS — D649 Anemia, unspecified: Secondary | ICD-10-CM | POA: Diagnosis present

## 2013-03-11 DIAGNOSIS — Z86010 Personal history of colonic polyps: Secondary | ICD-10-CM

## 2013-03-11 DIAGNOSIS — D696 Thrombocytopenia, unspecified: Secondary | ICD-10-CM | POA: Diagnosis present

## 2013-03-11 DIAGNOSIS — F411 Generalized anxiety disorder: Secondary | ICD-10-CM | POA: Diagnosis present

## 2013-03-11 DIAGNOSIS — M109 Gout, unspecified: Secondary | ICD-10-CM

## 2013-03-11 DIAGNOSIS — K5790 Diverticulosis of intestine, part unspecified, without perforation or abscess without bleeding: Secondary | ICD-10-CM

## 2013-03-11 DIAGNOSIS — J9600 Acute respiratory failure, unspecified whether with hypoxia or hypercapnia: Secondary | ICD-10-CM | POA: Diagnosis present

## 2013-03-11 DIAGNOSIS — I129 Hypertensive chronic kidney disease with stage 1 through stage 4 chronic kidney disease, or unspecified chronic kidney disease: Secondary | ICD-10-CM | POA: Diagnosis present

## 2013-03-11 DIAGNOSIS — J4489 Other specified chronic obstructive pulmonary disease: Secondary | ICD-10-CM | POA: Diagnosis present

## 2013-03-11 DIAGNOSIS — N179 Acute kidney failure, unspecified: Secondary | ICD-10-CM | POA: Diagnosis present

## 2013-03-11 DIAGNOSIS — Z96659 Presence of unspecified artificial knee joint: Secondary | ICD-10-CM

## 2013-03-11 HISTORY — DX: Gastrointestinal hemorrhage, unspecified: K92.2

## 2013-03-11 HISTORY — DX: Family history of other specified conditions: Z84.89

## 2013-03-11 HISTORY — DX: Malignant neoplasm of prostate: C61

## 2013-03-11 HISTORY — DX: Diverticulosis of intestine, part unspecified, without perforation or abscess without bleeding: K57.90

## 2013-03-11 HISTORY — DX: Unspecified osteoarthritis, unspecified site: M19.90

## 2013-03-11 HISTORY — DX: Shortness of breath: R06.02

## 2013-03-11 HISTORY — DX: Essential (primary) hypertension: I10

## 2013-03-11 HISTORY — DX: Pneumonia, unspecified organism: J18.9

## 2013-03-11 LAB — DIFFERENTIAL
Basophils Absolute: 0 10*3/uL (ref 0.0–0.1)
Lymphocytes Relative: 4 % — ABNORMAL LOW (ref 12–46)
Lymphs Abs: 0.9 10*3/uL (ref 0.7–4.0)
Monocytes Relative: 7 % (ref 3–12)
Neutrophils Relative %: 89 % — ABNORMAL HIGH (ref 43–77)

## 2013-03-11 LAB — COMPREHENSIVE METABOLIC PANEL
ALT: 18 U/L (ref 0–53)
AST: 37 U/L (ref 0–37)
Albumin: 3.2 g/dL — ABNORMAL LOW (ref 3.5–5.2)
CO2: 21 mEq/L (ref 19–32)
Calcium: 9.2 mg/dL (ref 8.4–10.5)
Sodium: 129 mEq/L — ABNORMAL LOW (ref 135–145)
Total Protein: 7.1 g/dL (ref 6.0–8.3)

## 2013-03-11 LAB — POCT I-STAT TROPONIN I: Troponin i, poc: 0.04 ng/mL (ref 0.00–0.08)

## 2013-03-11 LAB — URINE MICROSCOPIC-ADD ON

## 2013-03-11 LAB — CBC
MCV: 83.6 fL (ref 78.0–100.0)
Platelets: 158 10*3/uL (ref 150–400)
RDW: 14.4 % (ref 11.5–15.5)
WBC: 23.3 10*3/uL — ABNORMAL HIGH (ref 4.0–10.5)

## 2013-03-11 LAB — URINALYSIS, ROUTINE W REFLEX MICROSCOPIC
Ketones, ur: 15 mg/dL — AB
Nitrite: POSITIVE — AB
Protein, ur: 100 mg/dL — AB

## 2013-03-11 LAB — GLUCOSE, CAPILLARY: Glucose-Capillary: 131 mg/dL — ABNORMAL HIGH (ref 70–99)

## 2013-03-11 LAB — APTT: aPTT: 41 seconds — ABNORMAL HIGH (ref 24–37)

## 2013-03-11 LAB — PROTIME-INR
INR: 1.22 (ref 0.00–1.49)
Prothrombin Time: 15.1 seconds (ref 11.6–15.2)

## 2013-03-11 MED ORDER — VANCOMYCIN HCL 10 G IV SOLR
1250.0000 mg | Freq: Once | INTRAVENOUS | Status: AC
  Administered 2013-03-12: 1250 mg via INTRAVENOUS
  Filled 2013-03-11: qty 1250

## 2013-03-11 MED ORDER — SODIUM CHLORIDE 0.9 % IV BOLUS (SEPSIS)
1000.0000 mL | Freq: Once | INTRAVENOUS | Status: AC
  Administered 2013-03-12: 1000 mL via INTRAVENOUS

## 2013-03-11 MED ORDER — PIPERACILLIN-TAZOBACTAM 3.375 G IVPB
3.3750 g | Freq: Once | INTRAVENOUS | Status: AC
  Administered 2013-03-12: 3.375 g via INTRAVENOUS
  Filled 2013-03-11: qty 50

## 2013-03-11 MED ORDER — ACETAMINOPHEN 325 MG RE SUPP
325.0000 mg | Freq: Once | RECTAL | Status: AC
  Administered 2013-03-12: 325 mg via RECTAL
  Filled 2013-03-11: qty 1

## 2013-03-11 MED ORDER — ONDANSETRON HCL 4 MG/2ML IJ SOLN
4.0000 mg | Freq: Once | INTRAMUSCULAR | Status: AC
  Administered 2013-03-12: 4 mg via INTRAVENOUS
  Filled 2013-03-11: qty 2

## 2013-03-11 MED ORDER — ACETAMINOPHEN 325 MG PO TABS: 650.0000 mg | ORAL_TABLET | Freq: Once | ORAL | Status: DC

## 2013-03-11 NOTE — ED Provider Notes (Signed)
CSN: [id number]     Arrival date & time 03/11/13  2047 History     First MD Initiated Contact with Patient 03/11/13 2215     Chief Complaint  Patient presents with  . Altered Mental Status   (Consider location/radiation/quality/duration/timing/severity/associated sxs/prior Treatment) HPI Comments: Patient is a 79 year old male with a past medical history of COPD and prostate cancer who presents with a 2 day history of generalized weakness and confusion. Symptoms started gradually and progressively worsened since the onset. Patient reports associated nausea and "tea-colored" urine. No aggravating/alleviating factors. No other associated symptoms. Patient did not try anything for symptoms.    Past Medical History  Diagnosis Date  . COPD (chronic obstructive pulmonary disease)   . GERD (gastroesophageal reflux disease)   . Anxiety and depression   . Gout   . CVA (cerebral infarction) 2012   Past Surgical History  Procedure Laterality Date  . Bil carotid stent placement    . Prostate surgery    . Rt total knee replacement     Family History  Problem Relation Age of Onset  . Cancer Father   . Diabetes Sister   . Heart disease Sister   . Hyperlipidemia Sister   . Hypertension Sister    History  Substance Use Topics  . Smoking status: Former Games developer  . Smokeless tobacco: Not on file  . Alcohol Use: No    Review of Systems  Gastrointestinal: Positive for nausea.  Neurological: Positive for weakness.  Psychiatric/Behavioral: Positive for confusion.  All other systems reviewed and are negative.    Allergies  Contrast media  Home Medications   Current Outpatient Rx  Name  Route  Sig  Dispense  Refill  . Albuterol Sulfate (VENTOLIN HFA IN)   Inhalation   Inhale 2 puffs into the lungs as needed.           . budesonide-formoterol (SYMBICORT) 80-4.5 MCG/ACT inhaler   Inhalation   Inhale 2 puffs into the lungs 2 (two) times daily.         . Cholecalciferol  (VITAMIN D PO)   Oral   Take 1 tablet by mouth daily.         Marland Kitchen lisinopril (PRINIVIL,ZESTRIL) 10 MG tablet   Oral   Take 10 mg by mouth daily.         Marland Kitchen omeprazole (PRILOSEC) 20 MG capsule   Oral   Take 20 mg by mouth daily.           . tamsulosin (FLOMAX) 0.4 MG CAPS capsule   Oral   Take 0.4 mg by mouth.         . mirtazapine (REMERON) 30 MG tablet   Oral   Take 30 mg by mouth at bedtime.            BP 101/51  Pulse 90  Temp(Src) 98.3 F (36.8 C) (Oral)  Resp 16  SpO2 93% Physical Exam  Nursing note and vitals reviewed. Constitutional: He is oriented to person, place, and time. He appears well-developed and well-nourished. No distress.  HENT:  Head: Normocephalic and atraumatic.  Eyes: Conjunctivae and EOM are normal. Pupils are equal, round, and reactive to light.  Neck: Normal range of motion.  Cardiovascular: Normal rate and regular rhythm.  Exam reveals no gallop and no friction rub.   No murmur heard. Pulmonary/Chest: Effort normal and breath sounds normal. He has no wheezes. He has no rales. He exhibits no tenderness.  Abdominal: Soft. He exhibits no  distension. There is no tenderness. There is no rebound and no guarding.  Musculoskeletal: Normal range of motion.  Neurological: He is alert and oriented to person, place, and time. Coordination normal.  Speech is goal-oriented. Moves limbs without ataxia.   Skin: Skin is warm and dry.  Psychiatric: He has a normal mood and affect. His behavior is normal.    ED Course   Procedures (including critical care time)  Labs Reviewed  APTT - Abnormal; Notable for the following:    aPTT 41 (*)    All other components within normal limits  CBC - Abnormal; Notable for the following:    WBC 23.3 (*)    Hemoglobin 12.5 (*)    HCT 37.1 (*)    All other components within normal limits  DIFFERENTIAL - Abnormal; Notable for the following:    Neutrophils Relative % 89 (*)    Lymphocytes Relative 4 (*)     Neutro Abs 20.8 (*)    Monocytes Absolute 1.6 (*)    All other components within normal limits  COMPREHENSIVE METABOLIC PANEL - Abnormal; Notable for the following:    Sodium 129 (*)    Chloride 95 (*)    Glucose, Bld 142 (*)    BUN 30 (*)    Creatinine, Ser 1.56 (*)    Albumin 3.2 (*)    GFR calc non Af Amer 40 (*)    GFR calc Af Amer 46 (*)    All other components within normal limits  GLUCOSE, CAPILLARY - Abnormal; Notable for the following:    Glucose-Capillary 131 (*)    All other components within normal limits  URINALYSIS, ROUTINE W REFLEX MICROSCOPIC - Abnormal; Notable for the following:    Color, Urine AMBER (*)    APPearance TURBID (*)    Hgb urine dipstick LARGE (*)    Bilirubin Urine SMALL (*)    Ketones, ur 15 (*)    Protein, ur 100 (*)    Nitrite POSITIVE (*)    Leukocytes, UA LARGE (*)    All other components within normal limits  URINE MICROSCOPIC-ADD ON - Abnormal; Notable for the following:    Squamous Epithelial / LPF FEW (*)    Bacteria, UA MANY (*)    All other components within normal limits  URINE CULTURE  CULTURE, BLOOD (ROUTINE X 2)  CULTURE, BLOOD (ROUTINE X 2)  PROTIME-INR  TROPONIN I  LACTIC ACID, PLASMA  POCT I-STAT TROPONIN I   Ct Head (brain) Wo Contrast  03/11/2013   *RADIOLOGY REPORT*  Clinical Data: The patient was found on the floor at home. Confusion and difficulty walking.  History of previous stroke with right-sided deficits.  CT HEAD WITHOUT CONTRAST  Technique:  Contiguous axial images were obtained from the base of the skull through the vertex without contrast.  Comparison: 04/19/2011  Findings: The diffuse cerebral atrophy.  Low attenuation changes in the deep white matter consistent with small vessel ischemia.  Focal area of encephalomalacia involving the left parietal region consistent with old infarct.  The hemorrhage seen in this location on the previous study has resolved.  There is also a focal old encephalomalacia in the right  occipital region which is stable since the previous study.  There is asymmetric dilatation of the left lateral ventricle consistent with volume loss in the area of previous infarct.  No abnormal extra-axial fluid collections.  Wallace Cullens- white matter junctions are not effaced. Basal cisterns are not effaced.  No evidence of acute intracranial hemorrhage. Metallic  structure in the right intracranial internal carotid artery consistent with a stent.  This is stable.  Calcification in the carotid arteries.  No depressed skull fractures.  Retention cyst or polyps in the maxillary antra.  There is increased density in the dura at the level of C1.  This could represent dural calcification or thickening due to hemorrhage or infection.  Consider additional examination of the cervical spine for further evaluation.  IMPRESSION: No acute intracranial abnormalities.  Old infarcts in the left parietal and right occipital region.  No acute intracranial hemorrhage.  Nonspecific increased density demonstrated in the dura of the visualized upper cervical region.   Original Report Authenticated By: Burman Nieves, M.D.   1. Urosepsis   2. Acute encephalopathy   3. Hypotension   4. UTI (lower urinary tract infection)     MDM  10:58 PM Labs show elevated WBC as well as increased creatinine. CT head unremarkable for acute changes. Vitals stable and patient afebrile. Chest xray and urinalysis pending.   Urinalysis shows UTI. Patient likely has Urosepsis due to fever and hypotension. Patient will be admitted. Patient will have fluids, Vanc, and zosyn.   Paged internal medicine unassigned with no call back. Patient signed out to Dr. Norlene Campbell who will admit the patient.   Emilia Beck, [place] 03/12/13 [phone]

## 2013-03-11 NOTE — ED Notes (Signed)
Patient transported to X-ray 

## 2013-03-11 NOTE — ED Notes (Addendum)
Pt brought in by son that states that pt was found in the floor at home.  Reports confusion and difficulty walking.  Pt has a previous history of stroke with R sided deficits per son.  Last known well was last night.  Pt was at home by himself since early this morning.  Son concerned that pt not taken straight to MD in the back.  Explained to son that pt was out of 8 hour window for "Code Stroke" but that we would follow protocols and order a CT and labs.  Pt of Dr. Pearlean Brownie.

## 2013-03-11 NOTE — ED Notes (Signed)
FAMILY REPORTED PT. FOUND ON THE FLOOR THIS EVENING , SLIGHT CONFUSION AND NAUSEA AT ARRIVAL , ALERT AND CONCIOUS AT ARRIVAL , SPEECH CLEAR , NO FACIAL ASYMMETRY  , EQUAL STRONG GRIPS / NO ARM DRIFT .

## 2013-03-11 NOTE — ED Notes (Signed)
CBG is 131. Notified Nurse Reita Cliche.

## 2013-03-11 NOTE — ED Notes (Signed)
MD at bedside. Patient will be transported to x-ray after.

## 2013-03-12 ENCOUNTER — Encounter (HOSPITAL_COMMUNITY): Payer: Self-pay | Admitting: Internal Medicine

## 2013-03-12 DIAGNOSIS — N390 Urinary tract infection, site not specified: Secondary | ICD-10-CM | POA: Diagnosis present

## 2013-03-12 DIAGNOSIS — E871 Hypo-osmolality and hyponatremia: Secondary | ICD-10-CM | POA: Diagnosis present

## 2013-03-12 DIAGNOSIS — G9340 Encephalopathy, unspecified: Secondary | ICD-10-CM

## 2013-03-12 DIAGNOSIS — N179 Acute kidney failure, unspecified: Secondary | ICD-10-CM | POA: Diagnosis present

## 2013-03-12 DIAGNOSIS — I959 Hypotension, unspecified: Secondary | ICD-10-CM

## 2013-03-12 LAB — COMPREHENSIVE METABOLIC PANEL
AST: 39 U/L — ABNORMAL HIGH (ref 0–37)
Albumin: 2.6 g/dL — ABNORMAL LOW (ref 3.5–5.2)
Alkaline Phosphatase: 54 U/L (ref 39–117)
Chloride: 100 mEq/L (ref 96–112)
Potassium: 4 mEq/L (ref 3.5–5.1)
Total Bilirubin: 0.4 mg/dL (ref 0.3–1.2)

## 2013-03-12 LAB — CBC WITH DIFFERENTIAL/PLATELET
Eosinophils Absolute: 0 10*3/uL (ref 0.0–0.7)
Eosinophils Relative: 0 % (ref 0–5)
Lymphs Abs: 0.8 10*3/uL (ref 0.7–4.0)
MCH: 28.4 pg (ref 26.0–34.0)
MCV: 83.2 fL (ref 78.0–100.0)
Monocytes Absolute: 0.8 10*3/uL (ref 0.1–1.0)
Platelets: 130 10*3/uL — ABNORMAL LOW (ref 150–400)
RBC: 3.94 MIL/uL — ABNORMAL LOW (ref 4.22–5.81)

## 2013-03-12 LAB — LACTIC ACID, PLASMA: Lactic Acid, Venous: 1.8 mmol/L (ref 0.5–2.2)

## 2013-03-12 MED ORDER — VANCOMYCIN HCL IN DEXTROSE 1-5 GM/200ML-% IV SOLN: 1000.0000 mg | INTRAVENOUS | Status: DC

## 2013-03-12 MED ORDER — TAMSULOSIN HCL 0.4 MG PO CAPS
0.4000 mg | ORAL_CAPSULE | Freq: Every day | ORAL | Status: DC
  Administered 2013-03-12 – 2013-03-15 (×4): 0.4 mg via ORAL
  Filled 2013-03-12 (×4): qty 1

## 2013-03-12 MED ORDER — ACETAMINOPHEN 325 MG PO TABS
650.0000 mg | ORAL_TABLET | Freq: Four times a day (QID) | ORAL | Status: DC | PRN
  Administered 2013-03-12: 650 mg via ORAL
  Filled 2013-03-12: qty 2

## 2013-03-12 MED ORDER — PANTOPRAZOLE SODIUM 40 MG PO TBEC
40.0000 mg | DELAYED_RELEASE_TABLET | Freq: Every day | ORAL | Status: DC
  Administered 2013-03-12 – 2013-03-15 (×4): 40 mg via ORAL
  Filled 2013-03-12 (×4): qty 1

## 2013-03-12 MED ORDER — SODIUM CHLORIDE 0.9 % IJ SOLN
3.0000 mL | Freq: Two times a day (BID) | INTRAMUSCULAR | Status: DC
  Administered 2013-03-14 – 2013-03-15 (×2): 3 mL via INTRAVENOUS

## 2013-03-12 MED ORDER — CHLORHEXIDINE GLUCONATE CLOTH 2 % EX PADS
6.0000 | MEDICATED_PAD | Freq: Every day | CUTANEOUS | Status: DC
  Administered 2013-03-13 – 2013-03-15 (×3): 6 via TOPICAL

## 2013-03-12 MED ORDER — PIPERACILLIN-TAZOBACTAM 3.375 G IVPB
3.3750 g | Freq: Three times a day (TID) | INTRAVENOUS | Status: DC
  Administered 2013-03-12 – 2013-03-14 (×7): 3.375 g via INTRAVENOUS
  Filled 2013-03-12 (×10): qty 50

## 2013-03-12 MED ORDER — ONDANSETRON HCL 4 MG PO TABS: 4.0000 mg | ORAL_TABLET | Freq: Four times a day (QID) | ORAL | Status: DC | PRN

## 2013-03-12 MED ORDER — BUDESONIDE-FORMOTEROL FUMARATE 80-4.5 MCG/ACT IN AERO
2.0000 | INHALATION_SPRAY | Freq: Two times a day (BID) | RESPIRATORY_TRACT | Status: DC
Start: 2013-03-12 — End: 2013-03-15
  Administered 2013-03-12 – 2013-03-15 (×6): 2 via RESPIRATORY_TRACT
  Filled 2013-03-12 (×3): qty 6.9

## 2013-03-12 MED ORDER — ENOXAPARIN SODIUM 40 MG/0.4ML ~~LOC~~ SOLN
40.0000 mg | SUBCUTANEOUS | Status: DC
  Administered 2013-03-12 – 2013-03-13 (×2): 40 mg via SUBCUTANEOUS
  Filled 2013-03-12 (×2): qty 0.4

## 2013-03-12 MED ORDER — ONDANSETRON HCL 4 MG/2ML IJ SOLN: 4.0000 mg | Freq: Four times a day (QID) | INTRAMUSCULAR | Status: DC | PRN

## 2013-03-12 MED ORDER — ACETAMINOPHEN 650 MG RE SUPP: 650.0000 mg | Freq: Four times a day (QID) | RECTAL | Status: DC | PRN

## 2013-03-12 MED ORDER — ALBUTEROL SULFATE HFA 108 (90 BASE) MCG/ACT IN AERS: 2.0000 | INHALATION_SPRAY | RESPIRATORY_TRACT | Status: DC | PRN

## 2013-03-12 MED ORDER — MUPIROCIN 2 % EX OINT
1.0000 "application " | TOPICAL_OINTMENT | Freq: Two times a day (BID) | CUTANEOUS | Status: DC
  Administered 2013-03-12 – 2013-03-15 (×6): 1 via NASAL
  Filled 2013-03-12 (×2): qty 22

## 2013-03-12 MED ORDER — SODIUM CHLORIDE 0.9 % IV SOLN
INTRAVENOUS | Status: AC
  Administered 2013-03-12 (×2): via INTRAVENOUS

## 2013-03-12 NOTE — Progress Notes (Signed)
ANTIBIOTIC CONSULT NOTE - INITIAL  Pharmacy Consult for vancomycin and zosyn  Indication: urosepsis  Allergies  Allergen Reactions  . Contrast Media [Iodinated Diagnostic Agents]     Patient Measurements: Weight: 167 lb 1.7 oz (75.8 kg) Adjusted Body Weight:   Vital Signs: Temp: 98.7 F (37.1 C) (08/18 0346) Temp src: Oral (08/18 0346) BP: 99/47 mmHg (08/18 0346) Pulse Rate: 74 (08/18 0346) Intake/Output from previous day:   Intake/Output from this shift:    Labs:  Recent Labs  03/11/13 2116  WBC 23.3*  HGB 12.5*  PLT 158  CREATININE 1.56*   The CrCl is unknown because both a height and weight (above a minimum accepted value) are required for this calculation. No results found for this basename: VANCOTROUGH, VANCOPEAK, VANCORANDOM, GENTTROUGH, GENTPEAK, GENTRANDOM, TOBRATROUGH, TOBRAPEAK, TOBRARND, AMIKACINPEAK, AMIKACINTROU, AMIKACIN,  in the last 72 hours   Microbiology: No results found for this or any previous visit (from the past 720 hour(s)).  Medical History: Past Medical History  Diagnosis Date  . COPD (chronic obstructive pulmonary disease)   . GERD (gastroesophageal reflux disease)   . Anxiety and depression   . Gout   . CVA (cerebral infarction) 2012    Medications:  Prescriptions prior to admission  Medication Sig Dispense Refill  . Albuterol Sulfate (VENTOLIN HFA IN) Inhale 2 puffs into the lungs as needed.        . budesonide-formoterol (SYMBICORT) 80-4.5 MCG/ACT inhaler Inhale 2 puffs into the lungs 2 (two) times daily.      . Cholecalciferol (VITAMIN D PO) Take 1 tablet by mouth daily.      Marland Kitchen lisinopril (PRINIVIL,ZESTRIL) 10 MG tablet Take 10 mg by mouth daily.      Marland Kitchen omeprazole (PRILOSEC) 20 MG capsule Take 20 mg by mouth daily.        . tamsulosin (FLOMAX) 0.4 MG CAPS capsule Take 0.4 mg by mouth.      . mirtazapine (REMERON) 30 MG tablet Take 30 mg by mouth at bedtime.         Assessment: 79 yo with fever and confusion thought to be UT  in origin. Confusion resolved with hydration and control of fever. vanc and zosyn started in ED  Per code sepsis. 1250mg x1 and 3.375 at~0100. adx for abx and hydration  Goal of Therapy:  Vancomycin trough level 15-20 mcg/ml  Plan:   Vancomycin 1 gm q24h next dose 2200 8/18 zosyn 3.375 q8h F/u cxs  Janice Coffin 03/12/2013,4:07 AM

## 2013-03-12 NOTE — Plan of Care (Signed)
Problem: Phase II Progression Outcomes Goal: Discharge plan established Recommend HH OT and 24 hr sup/assist after acute care d/c. If family unable to provide 24 hr sup/assist, then may need SNF depending on acute stay progress

## 2013-03-12 NOTE — Evaluation (Signed)
Physical Therapy Evaluation Patient Details Name: [name] MRN: [id number] DOB: 14-May-1930 Today's Date: 03/12/2013 Time: 4098-1191 PT Time Calculation (min): 27 min  PT Assessment / Plan / Recommendation History of Present Illness  [name] is a 79 y.o. male was brought in to the ER by patient's family as patient was found to be increasingly confused since morning. In the ER patient was found to be febrile and UA was showing features of UTI. Patient also was hypotensive on arrival improved on 1 L normal saline bolus. Patient was started empirically on antibiotics for sepsis secondary to UTI.    Clinical Impression  Patient demonstrates deficits in functional mobility as indicated below. Feel patient will benefit from skilled PT acutely to address deficits and maximize function. At this time, patient will need 24/7 supervision and assist, if family unable to provide patient may need ST SNF prior to dc home. Will continue to see as indicated and progress activity as tolerated.    PT Assessment  Patient needs continued PT services    Follow Up Recommendations  No PT follow up;Supervision/Assistance - 24 hour (if family cannot provide, may need SNF)          Equipment Recommendations  None recommended by PT       Frequency Min 4X/week    Precautions / Restrictions Precautions Precautions: Fall Restrictions Weight Bearing Restrictions: No   Pertinent Vitals/Pain No pain reported, DOE 2/4 with minimal activity on 4 liters, spo2 >95%      Mobility  Bed Mobility Bed Mobility: Supine to Sit;Sitting - Scoot to [place] of Bed;Sit to Supine;Scooting to HOB Supine to Sit: 4: Min guard Sitting - Scoot to [place] of Bed: 5: Supervision Sit to Supine: 4: Min assist Scooting to [facility]: 4: Min guard Details for Bed Mobility Assistance: A with LEs to return to supine in bed Transfers Transfers: Sit to Stand;Stand to Sit Sit to Stand: 4: Min assist;3: Mod assist;From bed Stand to Sit: 4:  Min assist;3: Mod assist;To bed Details for Transfer Assistance: pt required verbal and physical cues for safety and correct hand placement Ambulation/Gait Ambulation/Gait Assistance: 3: Mod assist Ambulation Distance (Feet): 6 Feet Assistive device: 1 person hand held assist Ambulation/Gait Assistance Details: Patient with increased flexed posture and staggering gait requiring mod assist to maintain balance and stability Gait Pattern: Step-to pattern;Shuffle    Exercises     PT Diagnosis: Difficulty walking;Altered mental status  PT Problem List: Decreased activity tolerance;Decreased balance;Decreased mobility;Decreased cognition;Decreased knowledge of use of DME;Decreased safety awareness PT Treatment Interventions: DME instruction;Gait training;Functional mobility training;Therapeutic activities;Therapeutic exercise;Balance training;Patient/family education     PT Goals(Current goals can be found in the care plan section) Acute Rehab PT Goals Patient Stated Goal: " To go home " PT Goal Formulation: With patient Time For Goal Achievement: 03/26/13 Potential to Achieve Goals: Fair  Visit Information  Last PT Received On: 03/12/13 Assistance Needed: +2 History of Present Illness: [name] is a 79 y.o. male was brought in to the ER by patient's family as patient was found to be increasingly confused since morning. In the ER patient was found to be febrile and UA was showing features of UTI. Patient also was hypotensive on arrival improved on 1 L normal saline bolus. Patient was started empirically on antibiotics for sepsis secondary to UTI.        Prior Functioning  Home Living Family/patient expects to be discharged to:: Private residence Living Arrangements: Children Type of Home: House Home Access:  Stairs to enter [employer] of Steps: 3-4 Home Layout: One level Home Equipment: Cane - single point;Walker - 2 wheels;Shower seat Prior Function Level of  Independence: Independent Communication Communication: No difficulties Dominant Hand: Right    Cognition  Cognition Arousal/Alertness: Awake/alert Behavior During Therapy: WFL for tasks assessed/performed Overall Cognitive Status: Impaired/Different from baseline Area of Impairment: Following commands;Safety/judgement;Problem solving;Awareness;Memory Following Commands: Follows multi-step commands inconsistently Safety/Judgement: Decreased awareness of deficits Problem Solving: Difficulty sequencing;Requires verbal cues;Requires tactile cues;Decreased initiation;Slow processing    Extremity/Trunk Assessment Upper Extremity Assessment Upper Extremity Assessment: Overall WFL for tasks assessed Lower Extremity Assessment Lower Extremity Assessment: Overall WFL for tasks assessed Cervical / Trunk Assessment Cervical / Trunk Assessment: Normal   Balance Balance Balance Assessed: Yes Dynamic Sitting Balance Dynamic Sitting - Balance Support: Feet supported;During functional activity Dynamic Sitting - Level of Assistance: 5: Stand by assistance Dynamic Sitting - Balance Activities: Lateral lean/weight shifting;Forward lean/weight shifting;Reaching for objects;Reaching across midline;Head control activities;Trunk control activities Static Standing Balance Static Standing - Balance Support: Bilateral upper extremity supported;During functional activity Static Standing - Level of Assistance: 3: Mod assist Static Standing - Comment/# of Minutes: 8 minutes Dynamic Standing Balance Dynamic Standing - Balance Support: During functional activity Dynamic Standing - Level of Assistance: 5: Stand by assistance Dynamic Standing - Balance Activities: Lateral lean/weight shifting;Forward lean/weight shifting Dynamic Standing - Comments: during hygiene and bathing activities  End of Session PT - End of Session Equipment Utilized During Treatment: Oxygen (4 liters) Activity Tolerance: Patient limited  by fatigue Patient left: in bed;with call bell/phone within reach;with bed alarm set Nurse Communication: Mobility status (red and sore sacral region may need padded bandage )  GP     Fabio Asa 03/12/2013, 4:34 PM Charlotte Crumb, PT DPT  [phone]

## 2013-03-12 NOTE — Progress Notes (Addendum)
[facility] PROGRESS NOTE  [name]:[id number] DOB: 06-Aug-1929 DOA: 03/11/2013 PCP: Gaspar Garbe, MD  Assessment/Plan  Acute metabolic encephalopathy due to UTI.  Mentation at baseline -  Continue IVF and abx -  Tele:  NSR -  D/c telemetry  UTI, pyelonephritis and severe sepsis with tachycardia and hypotension and mild AKI.  Fevers trending down -  F/u blood cultures -  F/u urine culture -  D/c vancomycin (treating for UTI) -  Continue zosyn for now as patient was clinically unwell for GNR  Acute on Chronic kidney injury, may be due to urinary outflow obstruction/UTI -  Improved with Abx and IVF -  Continue to hold ACEI -  Continue flomax  Hyponatremia, resolving with hydration.  Continue IVF  Hypotension in the setting of normal hypertension -  Continue to hold blood pressure mediactions -  Continue zosyn and IVF  Acute hypoxic respiratory failure, no evidence of PNA on CXR.  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    Leukocytosis, resolving Normocytic anemia, likely dilutional from IVF Thrombocytopenia, may have some dilutional effect vs. Mild DIC  Diet:  Healthy heart diet Access:  PIV IVF:  NS  Proph:  lovenox  Code Status: full Family Communication: patient alone Disposition Plan:  Pending results of blood and urine cultures   Consultants:  None  Procedures:  CT head  Antibiotics:  Vancomycin 8/18  Zosyn 8/18 >>  HPI/Subjective:  Patient states that he feels "rough."  Denies SOB, wheezing, coughing, but does not normally wear oxygen.  Denies nausea, vomiting, chest pain, abdominal pain, back pain.  Unable to tell me about bowel habits.     Objective: Filed Vitals:   03/12/13 0130 03/12/13 0215 03/12/13 0309 03/12/13 0346  BP: 106/48 97/43 99/58  99/47  Pulse: 80 77 84 74  Temp:    98.7 F (37.1 C)  TempSrc:    Oral  Resp: 21 27  20   Weight:    75.8 kg (167 lb 1.7 oz)  SpO2: 88% 92% 92% 95%   No intake or output data in the 24 hours ending 03/12/13 0815 Filed Weights   03/12/13 0346  Weight: 75.8 kg (167 lb 1.7 oz)    Exam:   General:  CM, No acute distress  HEENT:  NCAT, MMM  Cardiovascular:  RRR, soft S1 and S2 with 1/6 rumbling systolic murmur at the LSB no rg, 2+ pulses, warm extremities  Respiratory:  CTAB, no increased WOB  Abdomen:   NABS, soft, NT/ND  MSK:   Decreased tone and bulk, no LEE  Neuro:  Grossly moves upper extremities, but 4-/5 strenght bilateral lower extremities  Psych:  Alert, oriented to Monday, August, [place], but unclear about date and reason for being in the hospital.    Data Reviewed: Basic Metabolic Panel:  Recent Labs Lab 03/11/13 2116 03/12/13 0530  NA 129* 132*  K 4.7 4.0  CL 95* 100  CO2 21 21  GLUCOSE 142* 124*  BUN 30* 28*  CREATININE 1.56* 1.40*  CALCIUM 9.2 8.3*   Liver Function Tests:  Recent Labs Lab 03/11/13 2116 03/12/13 0530  AST 37 39*  ALT 18 17  ALKPHOS 66 54  BILITOT 0.6 0.4  PROT 7.1 6.0  ALBUMIN 3.2* 2.6*   No results found for this basename: LIPASE, AMYLASE,  in the last 168 hours No results found for this basename: AMMONIA,  in the last 168 hours CBC:  Recent Labs Lab 03/11/13 2116 03/12/13 0530  WBC 23.3* 16.2*  NEUTROABS 20.8* 14.6*  HGB 12.5* 11.2*  HCT 37.1* 32.8*  MCV 83.6 83.2  PLT 158 130*   Cardiac Enzymes:  Recent Labs Lab 03/11/13 2116  TROPONINI <0.30   BNP (last 3 results) No results found for this basename: PROBNP,  in the last 8760 hours CBG:  Recent Labs Lab 03/11/13 2059  GLUCAP 131*    No results found for this or any previous visit (from the past 240 hour(s)).   Studies: Dg Chest  2 View  03/11/2013   *RADIOLOGY REPORT*  Clinical Data: Elevated white cell count and altered mental status.  CHEST - 2 VIEW  Comparison: 04/15/2011  Findings: Hyperinflation suggesting emphysema.  Normal heart size and pulmonary vascularity.  Slight fibrosis in the lung bases.  No focal consolidation or airspace disease.  No blunting of costophrenic angles.  No pneumothorax.  Calcified and tortuous aorta.  Mild degenerative changes in the spine.  No significant change since the previous study.  IMPRESSION: Emphysematous changes in the lungs.  No evidence of active pulmonary disease.   Original Report Authenticated By: Burman Nieves, M.D.   Ct Head (brain) Wo Contrast  03/11/2013   *RADIOLOGY REPORT*  Clinical Data: The patient was found on the floor at home. Confusion and difficulty walking.  History of previous stroke with right-sided deficits.  CT HEAD WITHOUT CONTRAST  Technique:  Contiguous axial images were obtained from the base of the skull through the vertex without contrast.  Comparison: 04/19/2011  Findings: The diffuse cerebral atrophy.  Low attenuation changes in the deep white matter consistent with small vessel ischemia.  Focal area of encephalomalacia involving the left parietal region consistent with old infarct.  The hemorrhage seen in this location on the previous study has resolved.  There is also a focal old encephalomalacia in the right occipital region which is stable since the previous study.  There is asymmetric dilatation of the left lateral ventricle consistent with volume loss in the area of previous infarct.  No abnormal extra-axial fluid collections.  Wallace Cullens- white matter junctions are not effaced. Basal cisterns are not effaced.  No evidence of acute intracranial hemorrhage. Metallic structure in the right intracranial internal carotid artery consistent with a stent.  This is stable.  Calcification in the carotid arteries.  No depressed skull fractures.  Retention cyst or polyps in  the maxillary antra.  There is increased density in the dura at the level of C1.  This could represent dural calcification or thickening due to hemorrhage or infection.  Consider additional examination of the cervical spine for further evaluation.  IMPRESSION: No acute intracranial abnormalities.  Old infarcts in the left parietal and right occipital region.  No acute intracranial hemorrhage.  Nonspecific increased density demonstrated in the dura of the visualized upper cervical region.   Original Report Authenticated By: Burman Nieves, M.D.    Scheduled Meds: . budesonide-formoterol  2 puff Inhalation BID  . enoxaparin (LOVENOX) injection  40 mg Subcutaneous Q24H  . pantoprazole  40 mg Oral Daily  . piperacillin-tazobactam (ZOSYN)  IV  3.375 g Intravenous Q8H  . sodium chloride  3 mL Intravenous Q12H  . tamsulosin  0.4 mg Oral QPC supper  Continuous Infusions: . sodium chloride 125 mL/hr at 03/12/13 9147    Principal Problem:   Acute encephalopathy Active Problems:   UTI (lower urinary tract infection)   Hypotension   Hyponatremia   ARF (acute renal failure)    Time spent: 30 min    Jasamine Pottinger, [facility]  [facility] Pager [phone]. If 7PM-7AM, please contact night-coverage at [email or web address], password [facility] 03/12/2013, 8:15 AM  LOS: 1 day

## 2013-03-12 NOTE — Evaluation (Signed)
Occupational Therapy Evaluation Patient Details Name: [name] MRN: [id number] DOB: 11/17/1929 Today's Date: 03/12/2013 Time: 7829-5621 OT Time Calculation (min): 27 min  OT Assessment / Plan / Recommendation History of present illness [name] is a 79 y.o. male was brought in to the ER by patient's family as patient was found to be increasingly confused since morning. In the ER patient was found to be febrile and UA was showing features of UTI. Patient also was hypotensive on arrival improved on 1 L normal saline bolus. Patient was started empirically on antibiotics for sepsis secondary to UTI.    Clinical Impression   Pt demos decline in function with ADLs and ADL mobility safety. Pt demos confusion and decreased safety awareness as well. Pt would benefit from acute OT services to address impairments to help restore PLOF to return home safely. Pt may need SNF for short term rehab if family unable to provide 24 hour sup/assist initially    OT Assessment  Patient needs continued OT Services    Follow Up Recommendations  Supervision/Assistance - 24 hour;Home health OT;SNF    Barriers to Discharge   pt lives at home with son and dtr in law, unsure of availablel level of assist  Equipment Recommendations  Other (comment) (TBD)    Recommendations for Other Services    Frequency  Min 2X/week    Precautions / Restrictions Precautions Precautions: Fall Restrictions Weight Bearing Restrictions: No   Pertinent Vitals/Pain No c/o    ADL  Grooming: Performed;Wash/dry hands;Wash/dry face;Minimal assistance;Moderate assistance Where Assessed - Grooming: Supported standing Upper Body Bathing: Performed;Min guard;Minimal assistance Where Assessed - Upper Body Bathing: Supported standing Lower Body Bathing: Maximal assistance Upper Body Dressing: Performed;Min guard;Minimal assistance Where Assessed - Upper Body Dressing: Supported standing Lower Body Dressing: Performed;Maximal  assistance Toilet Transfer: Simulated;Minimal assistance;Moderate assistance Toilet Transfer Method: Sit to stand Toileting - Clothing Manipulation and Hygiene: Performed;Moderate assistance Where Assessed - Toileting Clothing Manipulation and Hygiene: Standing Tub/Shower Transfer Method: Not assessed Transfers/Ambulation Related to ADLs: pt required verbal and physical cues for safety and correct hand placement ADL Comments: pt demos confusion during ADLs. Pt washing feet with socks still on, pt washing same body parts over and over after being instructed otherwise. Pt washing arms and face agaib after washing groin area with same washcloth despite verbal cues    OT Diagnosis: Cognitive deficits;Generalized weakness  OT Problem List: Decreased strength;Decreased knowledge of use of DME or AE;Decreased knowledge of precautions;Decreased activity tolerance;Decreased safety awareness;Impaired balance (sitting and/or standing) OT Treatment Interventions: Self-care/ADL training;Therapeutic exercise;Patient/family education;Neuromuscular education;Balance training;Therapeutic activities;DME and/or AE instruction   OT Goals(Current goals can be found in the care plan section) Acute Rehab OT Goals Patient Stated Goal: " To go home " OT Goal Formulation: With patient Time For Goal Achievement: 03/19/13 Potential to Achieve Goals: Good ADL Goals Pt Will Perform Grooming: with set-up;with supervision;with min guard assist;standing Pt Will Perform Upper Body Bathing: with min guard assist;with supervision;with set-up;sitting;standing Pt Will Perform Lower Body Bathing: with mod assist;with min assist;sitting/lateral leans;sit to/from stand Pt Will Perform Upper Body Dressing: with set-up;with supervision;with min guard assist;sitting;standing Pt Will Perform Lower Body Dressing: with min assist;with mod assist;sitting/lateral leans;sit to/from stand Pt Will Transfer to Toilet: with min assist;with min  guard assist Pt Will Perform Toileting - Clothing Manipulation and hygiene: with min guard assist;with min assist;sit to/from stand Pt Will Perform Tub/Shower Transfer: with min guard assist;with min assist  Visit Information  Last OT Received On: 03/12/13 Assistance Needed: +  2 History of Present Illness: [name] is a 79 y.o. male was brought in to the ER by patient's family as patient was found to be increasingly confused since morning. In the ER patient was found to be febrile and UA was showing features of UTI. Patient also was hypotensive on arrival improved on 1 L normal saline bolus. Patient was started empirically on antibiotics for sepsis secondary to UTI.        Prior Functioning     Home Living Family/patient expects to be discharged to:: Private residence Living Arrangements: Children Type of Home: House Home Access: Stairs to enter Secretary/administrator of Steps: 3-4 Home Layout: One level Home Equipment: Cane - single point;Walker - 2 wheels;Shower seat Prior Function Level of Independence: Independent Communication Communication: No difficulties Dominant Hand: Right         Vision/Perception Vision - History Baseline Vision: Wears glasses only for reading Patient Visual Report: No change from baseline Perception Perception: Within Functional Limits   Cognition  Cognition Arousal/Alertness: Awake/alert Behavior During Therapy: WFL for tasks assessed/performed Overall Cognitive Status: Impaired/Different from baseline Area of Impairment: Following commands;Safety/judgement;Problem solving;Awareness;Memory Following Commands: Follows multi-step commands inconsistently Safety/Judgement: Decreased awareness of deficits Problem Solving: Difficulty sequencing;Requires verbal cues;Requires tactile cues;Decreased initiation;Slow processing    Extremity/Trunk Assessment Upper Extremity Assessment Upper Extremity Assessment: Overall WFL for tasks  assessed Cervical / Trunk Assessment Cervical / Trunk Assessment: Normal     Mobility Bed Mobility Bed Mobility: Supine to Sit;Sitting - Scoot to [place] of Bed;Sit to Supine;Scooting to HOB Supine to Sit: 4: Min guard Sitting - Scoot to [place] of Bed: 5: Supervision Sit to Supine: 4: Min assist Scooting to [facility]: 4: Min guard Details for Bed Mobility Assistance: A with LEs to return to supine in bed Transfers Transfers: Sit to Stand;Stand to Sit Sit to Stand: 4: Min assist;3: Mod assist;From bed Stand to Sit: 4: Min assist;3: Mod assist;To bed Details for Transfer Assistance: pt required verbal and physical cues for safety and correct hand placement     Exercise     Balance Balance Balance Assessed: Yes Dynamic Sitting Balance Dynamic Sitting - Balance Support: Feet supported;During functional activity Dynamic Sitting - Level of Assistance: 5: Stand by assistance Dynamic Sitting - Balance Activities: Lateral lean/weight shifting;Forward lean/weight shifting;Reaching for objects;Reaching across midline;Head control activities;Trunk control activities Static Standing Balance Static Standing - Balance Support: Bilateral upper extremity supported;During functional activity Static Standing - Level of Assistance: 3: Mod assist Static Standing - Comment/# of Minutes: 8 minutes Dynamic Standing Balance Dynamic Standing - Balance Support: During functional activity Dynamic Standing - Level of Assistance: 5: Stand by assistance Dynamic Standing - Balance Activities: Lateral lean/weight shifting;Forward lean/weight shifting Dynamic Standing - Comments: during hygiene and bathing activities   End of Session OT - End of Session Activity Tolerance: Patient limited by fatigue Patient left: in bed;with call bell/phone within reach;with bed alarm set  GO     Galen Manila 03/12/2013, 4:33 PM

## 2013-03-12 NOTE — Progress Notes (Signed)
Sacral foam dressing applied to reddened sacral area to prevent sacral ulcer. Will continue to monitor. No needs at this time.[name]

## 2013-03-12 NOTE — ED Provider Notes (Signed)
Medical screening examination/treatment/procedure(s) were conducted as a shared visit with non-physician practitioner(s) and myself.  I personally evaluated the patient during the encounter.  Patient with episode of confusion earlier, now back to baseline, fever, uti noted, low bp.  Lactate normal.  IVF and abx ordered, to be admitted.  Olivia Mackie, MD 03/12/13 [phone]

## 2013-03-12 NOTE — H&P (Signed)
[facility] History and Physical  [name] ZOX:[id number] DOB: 09/08/1929 DOA: 03/11/2013  Referring physician: ER physician. PCP: Zachary Garbe, MD family states that they go to [facility].  Chief Complaint: Confusion.  HPI: [name] is a 79 y.o. male was brought in to the ER by patient's family as patient was found to be increasingly confused since morning. In the ER patient was found to be febrile and UA was showing features of UTI. Patient also was hypotensive on arrival improved on 1 L normal saline bolus. Patient was started empirically on antibiotics for sepsis secondary to UTI. Patient mentation improved with start of IV fluids antibiotics and control of fever. On my exam patient is completely oriented and follows commands. Patient denies any chest pain shortness of breath nausea vomiting abdominal pain headache or any focal deficits. Patient will be admitted for further IV antibiotics and hydration. Patient blood pressure is low normal at this time.  Review of Systems: As presented in the history of presenting illness, rest negative.  Past Medical History  Diagnosis Date  . COPD (chronic obstructive pulmonary disease)   . GERD (gastroesophageal reflux disease)   . Anxiety and depression   . Gout   . CVA (cerebral infarction) 2012   Past Surgical History  Procedure Laterality Date  . Bil carotid stent placement    . Prostate surgery    . Rt total knee replacement     Social History:  reports that he has quit smoking. He does not have any smokeless tobacco history on file. He reports that he does not drink alcohol or use illicit drugs. Home. where does patient live-- Can do ADLs. Can patient participate in ADLs?  Allergies  Allergen Reactions  . Contrast Media [Iodinated Diagnostic Agents]     Family History  Problem Relation Age of Onset  . Cancer Father   . Diabetes Sister   . Heart disease Sister   . Hyperlipidemia Sister    . Hypertension Sister       Prior to Admission medications   Medication Sig Start Date End Date Taking? Authorizing Provider  Albuterol Sulfate (VENTOLIN HFA IN) Inhale 2 puffs into the lungs as needed.     Yes Historical Provider, MD  budesonide-formoterol (SYMBICORT) 80-4.5 MCG/ACT inhaler Inhale 2 puffs into the lungs 2 (two) times daily.   Yes Historical Provider, MD  Cholecalciferol (VITAMIN D PO) Take 1 tablet by mouth daily.   Yes Historical Provider, MD  lisinopril (PRINIVIL,ZESTRIL) 10 MG tablet Take 10 mg by mouth daily.   Yes Historical Provider, MD  omeprazole (PRILOSEC) 20 MG capsule Take 20 mg by mouth daily.     Yes Historical Provider, MD  tamsulosin (FLOMAX) 0.4 MG CAPS capsule Take 0.4 mg by mouth.   Yes Historical Provider, MD  mirtazapine (REMERON) 30 MG tablet Take 30 mg by mouth at bedtime.      Historical Provider, MD   Physical Exam: Filed Vitals:   03/11/13 2316 03/12/13 0130 03/12/13 0215 03/12/13 0309  BP: 92/55 106/48 97/43 99/58   Pulse: 85 80 77 84  Temp: 101.1 F (38.4 C)     TempSrc: Oral     Resp: 20 21 27    SpO2: 92% 88% 92% 92%     General:  Well-developed well-nourished.  Eyes: Anicteric no pallor.  ENT: No discharge from ears eyes nose mouth.  Neck: No mass felt.  Cardiovascular: S1-S2 heard.  Respiratory: No rhonchi or crepitations.  Abdomen: Soft nontender bowel  sounds present.  Skin: No rash.  Musculoskeletal: No edema.  Psychiatric: Appears normal.  Neurologic: Alert awake oriented to time place and person. Moves all extremities.  Labs on Admission:  Basic Metabolic Panel:  Recent Labs Lab 03/11/13 2116  NA 129*  K 4.7  CL 95*  CO2 21  GLUCOSE 142*  BUN 30*  CREATININE 1.56*  CALCIUM 9.2   Liver Function Tests:  Recent Labs Lab 03/11/13 2116  AST 37  ALT 18  ALKPHOS 66  BILITOT 0.6  PROT 7.1  ALBUMIN 3.2*   No results found for this basename: LIPASE, AMYLASE,  in the last 168 hours No results found  for this basename: AMMONIA,  in the last 168 hours CBC:  Recent Labs Lab 03/11/13 2116  WBC 23.3*  NEUTROABS 20.8*  HGB 12.5*  HCT 37.1*  MCV 83.6  PLT 158   Cardiac Enzymes:  Recent Labs Lab 03/11/13 2116  TROPONINI <0.30    BNP (last 3 results) No results found for this basename: PROBNP,  in the last 8760 hours CBG:  Recent Labs Lab 03/11/13 2059  GLUCAP 131*    Radiological Exams on Admission: Dg Chest 2 View  03/11/2013   *RADIOLOGY REPORT*  Clinical Data: Elevated white cell count and altered mental status.  CHEST - 2 VIEW  Comparison: 04/15/2011  Findings: Hyperinflation suggesting emphysema.  Normal heart size and pulmonary vascularity.  Slight fibrosis in the lung bases.  No focal consolidation or airspace disease.  No blunting of costophrenic angles.  No pneumothorax.  Calcified and tortuous aorta.  Mild degenerative changes in the spine.  No significant change since the previous study.  IMPRESSION: Emphysematous changes in the lungs.  No evidence of active pulmonary disease.   Original Report Authenticated By: Burman Nieves, M.D.   Ct Head (brain) Wo Contrast  03/11/2013   *RADIOLOGY REPORT*  Clinical Data: The patient was found on the floor at home. Confusion and difficulty walking.  History of previous stroke with right-sided deficits.  CT HEAD WITHOUT CONTRAST  Technique:  Contiguous axial images were obtained from the base of the skull through the vertex without contrast.  Comparison: 04/19/2011  Findings: The diffuse cerebral atrophy.  Low attenuation changes in the deep white matter consistent with small vessel ischemia.  Focal area of encephalomalacia involving the left parietal region consistent with old infarct.  The hemorrhage seen in this location on the previous study has resolved.  There is also a focal old encephalomalacia in the right occipital region which is stable since the previous study.  There is asymmetric dilatation of the left lateral ventricle  consistent with volume loss in the area of previous infarct.  No abnormal extra-axial fluid collections.  Wallace Cullens- white matter junctions are not effaced. Basal cisterns are not effaced.  No evidence of acute intracranial hemorrhage. Metallic structure in the right intracranial internal carotid artery consistent with a stent.  This is stable.  Calcification in the carotid arteries.  No depressed skull fractures.  Retention cyst or polyps in the maxillary antra.  There is increased density in the dura at the level of C1.  This could represent dural calcification or thickening due to hemorrhage or infection.  Consider additional examination of the cervical spine for further evaluation.  IMPRESSION: No acute intracranial abnormalities.  Old infarcts in the left parietal and right occipital region.  No acute intracranial hemorrhage.  Nonspecific increased density demonstrated in the dura of the visualized upper cervical region.   Original Report Authenticated By:  Burman Nieves, M.D.     Assessment/Plan Principal Problem:   Acute encephalopathy Active Problems:   UTI (lower urinary tract infection)   Hypotension   1. Acute encephalopathy secondary to developing sepsis secondary to UTI - mental status is essentially back to normal. Continue with fluids and IV antibiotics. 2. Developing sepsis secondary to UTI - patient blood pressure has improved with fluids. Continue with IV fluids and IV antibiotics. Until blood cultures and urine cultures are available patient has been placed on IV vancomycin and Zosyn. Hold lisinopril. 3. Acute renal failure with hyponatremia - probably secondary to dehydration. Continue with hydration and closely follow metabolic panel intake output. Hold lisinopril. 4. Hypertension - see #2 and 3. 5. COPD - presently not wheezing. Continue home inhalers. 6. History of hemorrhagic CVA in 2012 and ophthalmic artery aneurysm status post coiling.   Patient presently is off  antihypertensives due to hypotension and has to be restarted once blood pressure becomes normal and remains stable given that patient has had previous hemorrhagic CVA.  Code Status: Code.  Family Communication: None.  Disposition Plan: Admit to inpatient.    Mihira Tozzi N. [facility] Pager [phone].  If 7PM-7AM, please contact night-coverage [email or web address] Password [facility] 03/12/2013, 3:25 AM

## 2013-03-13 ENCOUNTER — Encounter (HOSPITAL_COMMUNITY): Payer: Self-pay | Admitting: Physical Medicine and Rehabilitation

## 2013-03-13 ENCOUNTER — Inpatient Hospital Stay (HOSPITAL_COMMUNITY): Payer: Medicare Other

## 2013-03-13 DIAGNOSIS — N179 Acute kidney failure, unspecified: Secondary | ICD-10-CM

## 2013-03-13 LAB — URINE CULTURE

## 2013-03-13 LAB — CLOSTRIDIUM DIFFICILE BY PCR: Toxigenic C. Difficile by PCR: NEGATIVE

## 2013-03-13 MED ORDER — ALBUTEROL SULFATE (5 MG/ML) 0.5% IN NEBU
2.5000 mg | INHALATION_SOLUTION | RESPIRATORY_TRACT | Status: DC
  Administered 2013-03-13: 2.5 mg via RESPIRATORY_TRACT
  Filled 2013-03-13: qty 0.5

## 2013-03-13 MED ORDER — IPRATROPIUM BROMIDE 0.02 % IN SOLN
0.5000 mg | RESPIRATORY_TRACT | Status: DC
  Administered 2013-03-13: 0.5 mg via RESPIRATORY_TRACT
  Filled 2013-03-13: qty 2.5

## 2013-03-13 MED ORDER — ALBUTEROL SULFATE (5 MG/ML) 0.5% IN NEBU: 2.5000 mg | INHALATION_SOLUTION | RESPIRATORY_TRACT | Status: DC | PRN

## 2013-03-13 MED ORDER — DOCUSATE SODIUM 100 MG PO CAPS: 100.0000 mg | ORAL_CAPSULE | Freq: Two times a day (BID) | ORAL | Status: DC

## 2013-03-13 MED ORDER — POLYETHYLENE GLYCOL 3350 17 G PO PACK
17.0000 g | PACK | Freq: Every day | ORAL | Status: DC | PRN
  Filled 2013-03-13: qty 1

## 2013-03-13 MED ORDER — SENNA 8.6 MG PO TABS
2.0000 | ORAL_TABLET | Freq: Every day | ORAL | Status: DC
  Filled 2013-03-13: qty 2

## 2013-03-13 NOTE — Progress Notes (Addendum)
Clinical Social Work Department CLINICAL SOCIAL WORK PLACEMENT NOTE 03/13/2013  Patient:  KIONTE, BAUMGARDNER  Account Number:  [id number] Admit date:  03/11/2013  Clinical Social Worker:  Carren Rang  Date/time:  03/13/2013 10:59 AM  Clinical Social Work is seeking post-discharge placement for this patient at the following level of care:   SKILLED NURSING   (*CSW will update this form in Epic as items are completed)   03/13/2013  Patient/family provided with [facility] Department of Clinical Social Work's list of facilities offering this level of care within the geographic area requested by the patient (or if unable, by the patient's family).  03/13/2013  Patient/family informed of their freedom to choose among providers that offer the needed level of care, that participate in Medicare, Medicaid or managed care program needed by the patient, have an available bed and are willing to accept the patient.  03/13/2013  Patient/family informed of [facility]' ownership interest in [facility], as well as of the fact that they are under no obligation to receive care at this facility.  PASARR submitted to EDS on 03/13/2013 PASARR number received from EDS on 03/13/2013  FL2 transmitted to all facilities in geographic area requested by pt/family on  03/13/2013 FL2 transmitted to all facilities within larger geographic area on   Patient informed that his/her managed care company has contracts with or will negotiate with  certain facilities, including the following:     Patient/family informed of bed offers received:   Patient chooses bed at  Physician recommends and patient chooses bed at    Patient to be transferred to  on   Patient to be transferred to facility by   The following physician request were entered in Epic:   Additional Comments:   Maree Krabbe, MSW, [employer] [phone]

## 2013-03-13 NOTE — Progress Notes (Addendum)
[facility] PROGRESS NOTE  [name]:[id number] DOB: 09-12-1929 DOA: 03/11/2013 PCP: Gaspar Garbe, MD  Brief summary  79 yo M with history of COPD, GERD, anxiety and depression, gout, CVA, BPH and urinary outflow obstruction who presents with urosepsis due to E. Coli and AKI.  Blood cultures so far negative.  BP improving.  See discharge thoughts below.  Ready for discharge in several days.    Assessment/Plan  Acute metabolic encephalopathy due to UTI.  Oriented to person, place, August, but having problems with higher level cognition.   -  Continue IVF and abx  UTI, pyelonephritis and severe sepsis vs. septic shock given elevated creatinine with tachycardia and hypotension and mild AKI.  Fevers trending down and blood pressure improved -  Bood cultures NGTD -  Urine culture with E. Coli, sensitivities pending -  Continue zosyn.  Hx of MDR organisms.  Acute on Chronic kidney injury, likely due to sepsis and UTI and responding to IVF and antibiotics -  RUS -  Improving with Abx and IVF -  Continue to hold ACEI -  Continue flomax  Hypotension in the setting of normal hypertension, resolving -  Continue to hold blood pressure medications -  Continue zosyn  Acute hypoxic respiratory failure, no evidence of PNA on CXR.  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    Hyponatremia, resolving with hydration.  Continue IVF Leukocytosis, resolving Normocytic anemia, likely dilutional from IVF Thrombocytopenia, may have some dilutional effect vs. Mild DIC  Diet:  Healthy heart diet Access:  PIV IVF:  NS  Proph:  lovenox  Code Status: full Family Communication:   Patient and son (call home number to reach).  Son likes daily updates. Disposition Plan:  Family would eventually like to take patient home.  Patient is motivated and family able to take home regardless of level of assistance at discharge so would be good CIR candidate if diagnosis code of septic shock or severe sepsis is acceptable.  Otherwise, he could go to SNF for a few days at discharge while family is on vacation (leave on Saturday) and they could pick him up, or the son could take him home when discharged with home health services.    Consultants:  None  Procedures:  CT head  RUS pending  Antibiotics:  Vancomycin 8/18  Zosyn 8/18 >>  HPI/Subjective:  Patient states that he feels sick to his stomach.  Mild SOB and wheezing with dry nonproductive cough.  Denies abdominal pain, back pain.  Unable to tell me about bowel habits.    Objective: Filed Vitals:   03/12/13 2116 03/12/13 2126 03/13/13 0500 03/13/13 0501  BP:    145/84  Pulse:    90  Temp: 98.8 F (37.1 C)   98.8 F (37.1 C)  TempSrc: Oral   Oral  Resp:    20  Weight:   76.8 kg (169 lb 5 oz)   SpO2:  97%  97%    Intake/Output Summary (Last 24 hours) at 03/13/13 1037 Last data filed at 03/13/13 0500  Gross per 24 hour  Intake    480 ml  Output  1700 ml  Net  -1220 ml   Filed Weights   03/12/13 0346 03/13/13 0500  Weight: 75.8 kg (167 lb 1.7 oz) 76.8 kg (169 lb 5 oz)    Exam:   General:  CM, mild respiratory distress, nasal canula in place  HEENT:  NCAT, MMM  Cardiovascular:  RRR, soft S1 and S2 with 1/6 rumbling systolic murmur at the LSB no rg, 2+ pulses, warm extremities  Respiratory:   Diminished bilateral breath sounds with full expiratory wheeze, no rales or rhonchi, occasional forced expiratory phase  Abdomen:   NABS, soft, NT/ND  MSK:   Decreased tone and bulk, no LEE  Neuro:  Grossly moves upper extremities, but 4-/5 strength bilateral lower extremities  Psych:  Alert, oriented to  August, [place], but unclear about date and reason for being in the hospital.    Data Reviewed: Basic Metabolic Panel:  Recent Labs Lab 03/11/13 2116 03/12/13 0530  NA 129* 132*  K 4.7 4.0  CL 95* 100  CO2 21 21  GLUCOSE 142* 124*  BUN 30* 28*  CREATININE 1.56* 1.40*  CALCIUM 9.2 8.3*   Liver Function Tests:  Recent Labs Lab 03/11/13 2116 03/12/13 0530  AST 37 39*  ALT 18 17  ALKPHOS 66 54  BILITOT 0.6 0.4  PROT 7.1 6.0  ALBUMIN 3.2* 2.6*   No results found for this basename: LIPASE, AMYLASE,  in the last 168 hours No results found for this basename: AMMONIA,  in the last 168 hours CBC:  Recent Labs Lab 03/11/13 2116 03/12/13 0530  WBC 23.3* 16.2*  NEUTROABS 20.8* 14.6*  HGB 12.5* 11.2*  HCT 37.1* 32.8*  MCV 83.6 83.2  PLT 158 130*   Cardiac Enzymes:  Recent Labs Lab 03/11/13 2116  TROPONINI <0.30   BNP (last 3 results) No results found for this basename: PROBNP,  in the last 8760 hours CBG:  Recent Labs Lab 03/11/13 2059  GLUCAP 131*    Recent Results (from the past 240 hour(s))  URINE CULTURE     Status: None   Collection Time    03/11/13 11:01 PM      Result Value Range Status   Specimen Description URINE, RANDOM   Final   Special Requests Normal   Final   Culture  Setup Time     Final   Value: 03/11/2013 23:49     Performed at [facility] Count     Final   Value: >=100,000 COLONIES/ML     Performed at [facility]   Culture     Final   Value: ESCHERICHIA COLI     Performed at [facility]   Report Status PENDING   Incomplete  CULTURE, BLOOD (ROUTINE X 2)     Status: None   Collection Time    03/12/13 12:05 AM      Result Value Range Status   Specimen Description BLOOD RIGHT ARM   Final   Special Requests BOTTLES DRAWN AEROBIC AND ANAEROBIC 10CC EA   Final   Culture  Setup Time     Final   Value: 03/12/2013 09:43     Performed at [facility]   Culture     Final   Value:         BLOOD CULTURE RECEIVED NO GROWTH TO DATE CULTURE WILL BE HELD FOR 5 DAYS BEFORE ISSUING A FINAL NEGATIVE REPORT     Performed at [facility]   Report Status PENDING  Incomplete  CULTURE, BLOOD (ROUTINE X 2)     Status: None   Collection Time    03/12/13  5:30 AM      Result Value Range Status   Specimen Description BLOOD LEFT ARM   Final   Special Requests     Final   Value: BOTTLES DRAWN AEROBIC AND ANAEROBIC 6CC EA PATIENT ON FOLLOWING VANCOMYCIN,ZOSYN   Culture  Setup Time     Final   Value: 03/12/2013 11:02     Performed at [facility]   Culture     Final   Value:        BLOOD CULTURE RECEIVED NO GROWTH TO DATE CULTURE WILL BE HELD FOR 5 DAYS BEFORE ISSUING A FINAL NEGATIVE REPORT     Performed at [facility]   Report Status PENDING   Incomplete  MRSA PCR SCREENING     Status: Abnormal   Collection Time    03/12/13 11:15 AM      Result Value Range Status   MRSA by PCR POSITIVE (*) NEGATIVE Final   Comment:            The GeneXpert MRSA Assay (FDA     approved for NASAL specimens     only), is one component of a     comprehensive MRSA colonization     surveillance program. It is not     intended to diagnose MRSA     infection nor to guide or     monitor treatment for     MRSA infections.     RESULT CALLED TO, READ BACK BY AND VERIFIED WITH:     Albesa Seen AT 1404 03/12/13 BY K BARR     Studies: Dg Chest 2 View  03/11/2013   *RADIOLOGY REPORT*  Clinical Data: Elevated white cell count and altered mental status.  CHEST - 2 VIEW  Comparison: 04/15/2011  Findings: Hyperinflation suggesting emphysema.  Normal heart size and pulmonary vascularity.  Slight fibrosis in the lung bases.  No focal consolidation or airspace disease.  No blunting of costophrenic angles.  No pneumothorax.  Calcified and tortuous aorta.  Mild degenerative changes in the spine.  No significant change since the previous study.  IMPRESSION: Emphysematous changes in the  lungs.  No evidence of active pulmonary disease.   Original Report Authenticated By: Burman Nieves, M.D.   Ct Head (brain) Wo Contrast  03/11/2013   *RADIOLOGY REPORT*  Clinical Data: The patient was found on the floor at home. Confusion and difficulty walking.  History of previous stroke with right-sided deficits.  CT HEAD WITHOUT CONTRAST  Technique:  Contiguous axial images were obtained from the base of the skull through the vertex without contrast.  Comparison: 04/19/2011  Findings: The diffuse cerebral atrophy.  Low attenuation changes in the deep white matter consistent with small vessel ischemia.  Focal area of encephalomalacia involving the left parietal region consistent with old infarct.  The hemorrhage seen in this location on the previous study has resolved.  There is also a focal old encephalomalacia in the right occipital region which is stable since the previous study.  There is asymmetric dilatation of the left lateral ventricle consistent with volume loss in the area of previous infarct.  No abnormal extra-axial fluid collections.  Wallace Cullens- white matter junctions are not effaced. Basal cisterns are not effaced.  No evidence of acute intracranial hemorrhage. Metallic structure in the right intracranial internal carotid artery consistent with a stent.  This is stable.  Calcification in the carotid arteries.  No depressed skull fractures.  Retention cyst or polyps in the maxillary antra.  There is increased density in the dura at the level of C1.  This could represent dural calcification or thickening due to hemorrhage or infection.  Consider additional examination of the cervical spine for further evaluation.  IMPRESSION: No acute intracranial abnormalities.  Old infarcts in the left parietal and right occipital region.  No acute intracranial hemorrhage.  Nonspecific increased density demonstrated in the dura of the visualized upper cervical region.   Original Report Authenticated By: Burman Nieves, M.D.    Scheduled Meds: . budesonide-formoterol  2 puff Inhalation BID  . Chlorhexidine Gluconate Cloth  6 each Topical Q0600  . enoxaparin (LOVENOX) injection  40 mg Subcutaneous Q24H  . mupirocin ointment  1 application Nasal BID  . pantoprazole  40 mg Oral Daily  . piperacillin-tazobactam (ZOSYN)  IV  3.375 g Intravenous Q8H  . sodium chloride  3 mL Intravenous Q12H  . tamsulosin  0.4 mg Oral QPC supper   Continuous Infusions:    Principal Problem:   Acute encephalopathy Active Problems:   UTI (lower urinary tract infection)   Hypotension   Hyponatremia   ARF (acute renal failure)    Time spent: 30 min    [name]  [facility] Pager [phone]. If 7PM-7AM, please contact night-coverage at [email or web address], password [facility] 03/13/2013, 10:37 AM  LOS: 2 days

## 2013-03-13 NOTE — Progress Notes (Signed)
Thank you for consult on [name]. He's known to CIR from prior stay for stroke. PT/OT evaluations done this am and supervision recommended past discharge. His insurance will not cover CIR for current diagnosis. Would recommend SNF or home with 24 hours supervision.

## 2013-03-13 NOTE — Care Management Note (Signed)
    Page 1 of 2   03/15/2013     3:34:53 PM   CARE MANAGEMENT NOTE 03/15/2013  Patient:  [name], [name]   Account Number:  [id number]  Date Initiated:  03/13/2013  Documentation initiated by:  Camari Quintanilla  Subjective/Objective Assessment:   PT ADM ON 03/11/13 WITH UROSEPSIS.  PTA, PT LIVES AT HOME AND IS CARED FOR BY SON.     Action/Plan:   P.T. RECOMMENDATION IS FOR CIR AT DC, BUT INSURANCE WILL NOT COVER REHAB STAY FOR THIS DX.  WILL LIKELY NEED SNF FOR REHAB VS. HOME WITH SON AND HH CARE.   Anticipated DC Date:  03/15/2013   Anticipated DC Plan:  HOME W HOME HEALTH SERVICES  In-house referral  Clinical Social Worker      DC Planning Services  CM consult      [facility] Choice  HOME HEALTH   Choice offered to / List presented to:  C-1 Patient        HH arranged  HH-1 RN  HH-2 PT  HH-3 OT  HH-4 NURSE'S AIDE      HH agency  [facility]   Status of service:  Completed, signed off Medicare Important Message given?   (If response is "NO", the following Medicare IM given date fields will be blank) Date Medicare IM given:   Date Additional Medicare IM given:    Discharge Disposition:  HOME W HOME HEALTH SERVICES  Per UR Regulation:  Reviewed for med. necessity/level of care/duration of stay  If discussed at Long Length of Stay Meetings, dates discussed:    Comments:  03/15/13 Rosalita Chessman [phone] PT FOR DISCHARGE HOME TODAY; NOTIFIED [facility] OF DC DATE. DAUGHTER IN LAW TO TRANSPORT PT HOME.  03/14/13 Naythan Douthit,RN,BSN [phone] SPOKE WITH PT'S SON, Cuong, JR, BY [phone]):  SON STATES DC PLAN IS TO HAVE 24HR SUPERVISION WITH FAMILY AND HIRE PRIVATE DUTY CARE, IF NEEDED.  PT HAS USED [facility] IN THE PAST, AND SON WISHES TO USE AGAIN.  HE STATES PT HAS ALL NEEDED DME AT HOME.  WOULD RECOMMEND HHRN, PT, OT AND AIDE. PRIVATE DUTY SITTER/AIDE LIST LEFT FOR FAMILY IN PT'S ROOM. WILL FOLLOW FOR HH ORDERS.

## 2013-03-13 NOTE — Progress Notes (Addendum)
Physical Therapy Treatment Patient Details Name: [name] MRN: [id number] DOB: Mar 31, 1930 Today's Date: 03/13/2013 Time: 1478-2956 PT Time Calculation (min): 28 min  PT Assessment / Plan / Recommendation  History of Present Illness [name] is a 79 y.o. male was brought in to the ER by patient's family as patient was found to be increasingly confused since morning. In the ER patient was found to be febrile and UA was showing features of UTI. Patient also was hypotensive on arrival improved on 1 L normal saline bolus. Patient was started empirically on antibiotics for sepsis secondary to UTI.    PT Comments   Patient with increased confusion today compared to yesterday afternoon's evaluation. Patient demonstrates increased need for assist. Ideally, if patient begins to clear and demonstrates improvements in functional mobility goal would be to dc home with family supervision, at current mobility and functional level, patient will need further inpatient rehabilitation prior to dc home. Will continue to work with patient to progress activity as tolerated.    Follow Up Recommendations  CIR;Supervision/Assistance - 24 hour            Equipment Recommendations  None recommended by PT       Frequency Min 4X/week   Progress towards PT Goals Progress towards PT goals: Not progressing toward goals - comment  Plan Discharge plan needs to be updated    Precautions / Restrictions Precautions Precautions: Fall Precaution Comments: cognitive deficits. frequent diarrhea   Pertinent Vitals/Pain No pain reported    Mobility  Bed Mobility Bed Mobility: Supine to Sit;Sitting - Scoot to Edge of Bed Supine to Sit: 5: Supervision Sitting - Scoot to [place] of Bed: 5: Supervision Details for Bed Mobility Assistance: S Transfers Transfers: Sit to Stand;Stand to Sit Sit to Stand: 3: Mod assist Stand to Sit: 3: Mod assist Details for Transfer Assistance: unsteady. post bias at times (+2 HHA for  ambulation) Ambulation/Gait Ambulation/Gait Assistance: 1: +2 Total assist Ambulation/Gait: Patient Percentage: 60% Ambulation Distance (Feet): 32 Feet Assistive device: 2 person hand held assist Ambulation/Gait Assistance Details: Patient with difficulty ambulating, posterior lean, unable to maintain upright stability  Gait Pattern: Step-to pattern;Shuffle Gait velocity: decreased General Gait Details: unsteady      PT Goals (current goals can now be found in the care plan section) Acute Rehab PT Goals Patient Stated Goal: " To go home " PT Goal Formulation: With patient Time For Goal Achievement: 03/26/13 Potential to Achieve Goals: Fair  Visit Information  Last PT Received On: 03/13/13 Assistance Needed: +2 History of Present Illness: [name] is a 79 y.o. male was brought in to the ER by patient's family as patient was found to be increasingly confused since morning. In the ER patient was found to be febrile and UA was showing features of UTI. Patient also was hypotensive on arrival improved on 1 L normal saline bolus. Patient was started empirically on antibiotics for sepsis secondary to UTI.     Subjective Data  Patient Stated Goal: " To go home "   Cognition  Cognition Arousal/Alertness: Awake/alert Behavior During Therapy: WFL for tasks assessed/performed Overall Cognitive Status: Impaired/Different from baseline Area of Impairment: Attention;Memory;Following commands;Safety/judgement;Awareness;Problem solving Current Attention Level: Sustained Memory: Decreased recall of precautions;Decreased short-term memory Following Commands: Follows one step commands with increased time Safety/Judgement: Decreased awareness of safety;Decreased awareness of deficits Awareness: Intellectual Problem Solving: Slow processing;Difficulty sequencing;Requires verbal cues General Comments: Different from baseline       End of Session PT -  End of Session Equipment Utilized During  Treatment: Oxygen (1 liter) Activity Tolerance: Patient limited by fatigue Patient left: in chair;with call bell/phone within reach;with nursing/sitter in room Nurse Communication: Mobility status (may need CDiff testing as pt incontinent x3 with brown runny foul smelling stool)   GP     Fabio Asa 03/13/2013, 1:30 PM Charlotte Crumb, PT DPT  [phone]

## 2013-03-13 NOTE — Progress Notes (Signed)
Occupational Therapy Treatment Patient Details Name: [name] MRN: [id number] DOB: 08/05/1929 Today's Date: 03/13/2013 Time: 7829-5621 OT Time Calculation (min): 28 min  OT Assessment / Plan / Recommendation  History of present illness [name] is a 79 y.o. male was brought in to the ER by patient's family as patient was found to be increasingly confused since morning. In the ER patient was found to be febrile and UA was showing features of UTI. Patient also was hypotensive on arrival improved on 1 L normal saline bolus. Patient was started empirically on antibiotics for sepsis secondary to UTI.    OT comments  Pt with significant functional decline. Pt is not at this baseline cognitive level. Pt will need SNF unless family is able to provide 24/7 assistance. Will follow  Follow Up Recommendations  SNF;Home health OT    Barriers to Discharge       Equipment Recommendations  Other (comment)    Recommendations for Other Services    Frequency Min 2X/week   Progress towards OT Goals Progress towards OT goals: Progressing toward goals  Plan Discharge plan remains appropriate    Precautions / Restrictions Precautions Precautions: Fall Precaution Comments: cognitive deficits. frequent diarrhea   Pertinent Vitals/Pain no apparent distress     ADL  Upper Body Dressing: Moderate assistance Where Assessed - Upper Body Dressing: Supported sit to stand ADL Comments: Apparent cognitive deficits    OT Diagnosis:    OT Problem List:   OT Treatment Interventions:     OT Goals(current goals can now be found in the care plan section) Acute Rehab OT Goals Patient Stated Goal: " To go home " OT Goal Formulation: With patient Time For Goal Achievement: 03/19/13 Potential to Achieve Goals: Good ADL Goals Pt Will Perform Grooming: with set-up;with supervision;with min guard assist;standing Pt Will Perform Upper Body Bathing: with min guard assist;with supervision;with  set-up;sitting;standing Pt Will Perform Lower Body Bathing: with mod assist;with min assist;sitting/lateral leans;sit to/from stand Pt Will Perform Upper Body Dressing: with set-up;with supervision;with min guard assist;sitting;standing Pt Will Perform Lower Body Dressing: with min assist;with mod assist;sitting/lateral leans;sit to/from stand Pt Will Transfer to Toilet: with min assist;with min guard assist Pt Will Perform Toileting - Clothing Manipulation and hygiene: with min guard assist;with min assist;sit to/from stand Pt Will Perform Tub/Shower Transfer: with min guard assist;with min assist  Visit Information  Last OT Received On: 03/13/13 Assistance Needed: +1 History of Present Illness: [name] is a 79 y.o. male was brought in to the ER by patient's family as patient was found to be increasingly confused since morning. In the ER patient was found to be febrile and UA was showing features of UTI. Patient also was hypotensive on arrival improved on 1 L normal saline bolus. Patient was started empirically on antibiotics for sepsis secondary to UTI.     Subjective Data      Prior Functioning       Cognition  Cognition Arousal/Alertness: Awake/alert Behavior During Therapy: WFL for tasks assessed/performed Overall Cognitive Status: Impaired/Different from baseline Area of Impairment: Attention;Memory;Following commands;Safety/judgement;Awareness;Problem solving Current Attention Level: Sustained Memory: Decreased recall of precautions;Decreased short-term memory Following Commands: Follows one step commands with increased time Safety/Judgement: Decreased awareness of safety;Decreased awareness of deficits Awareness: Intellectual Problem Solving: Slow processing;Difficulty sequencing;Requires verbal cues General Comments: Different from baseline    Mobility  Bed Mobility Details for Bed Mobility Assistance: S Transfers Transfers: Sit to Stand;Stand to Sit Sit to Stand:  3: Mod assist Details  for Transfer Assistance: unsteady. post bias at times (+2 HHA for ambulation)    Exercises      Balance  min A dynamic stand   End of Session OT - End of Session Equipment Utilized During Treatment: Gait belt Activity Tolerance: Patient limited by fatigue;Other (comment) (frequent diarrhea) Patient left: in chair;with call bell/phone within reach Nurse Communication: Mobility status;Other (comment) (pt incontinenet of BM)  GO     Gualberto Wahlen,HILLARY 03/13/2013, 1:12 PM [facility] [place], OTR/L  [phone] 03/13/2013

## 2013-03-13 NOTE — Progress Notes (Signed)
Clinical Social Work Department BRIEF PSYCHOSOCIAL ASSESSMENT 03/13/2013  Patient:  [name], [name]     Account Number:  [id number]     Admit date:  03/11/2013  Clinical Social Worker:  Carren Rang  Date/Time:  03/13/2013 10:54 AM  Referred by:  Care Management  Date Referred:  03/13/2013 Referred for  SNF Placement   Other Referral:   Interview type:  Family Other interview type:   Son at bedside    PSYCHOSOCIAL DATA Living Status:  FAMILY Admitted from facility:   Level of care:   Primary support name:  [name](daughter) and Son Primary support relationship to patient:  CHILD, ADULT Degree of support available:   Good    CURRENT CONCERNS Current Concerns  Post-Acute Placement   Other Concerns:    SOCIAL WORK ASSESSMENT / PLAN Clinical Social Worker received referral for SNF placement at d/c. CSW introduced self and explained reason for visit. (Patient had visitor by bedside, Son. Son asked CSW to step out of the room. CSW explained SNF process and provided SNF packet to family. Son stated he may be agreeable for SNF placement but really prefers [facility]. Son stated he may have family look after patient at home if they decide against SNF.  CSW will complete FL2 for MD's signature and will update patient and family when bed offers are received.   Assessment/plan status:  Psychosocial Support/Ongoing Assessment of Needs Other assessment/ plan:   Information/referral to community resources:   SNF packet    PATIENT'S/FAMILY'S RESPONSE TO PLAN OF CARE: Patient's son states he prefers CIR because his dad has been there before and knows they will take great care of him. CSW will consult with MD.       Maree Krabbe, MSW, [facility] [phone]

## 2013-03-14 DIAGNOSIS — E871 Hypo-osmolality and hyponatremia: Secondary | ICD-10-CM

## 2013-03-14 LAB — BASIC METABOLIC PANEL
CO2: 21 mEq/L (ref 19–32)
Calcium: 7.9 mg/dL — ABNORMAL LOW (ref 8.4–10.5)
GFR calc Af Amer: 77 mL/min — ABNORMAL LOW (ref >90)
GFR calc non Af Amer: 66 mL/min — ABNORMAL LOW (ref >90)
Sodium: 132 mEq/L — ABNORMAL LOW (ref 135–145)

## 2013-03-14 LAB — GLUCOSE, CAPILLARY: Glucose-Capillary: 106 mg/dL — ABNORMAL HIGH (ref 70–99)

## 2013-03-14 LAB — CBC
Platelets: 91 10*3/uL — ABNORMAL LOW (ref 150–400)
RBC: 3.59 MIL/uL — ABNORMAL LOW (ref 4.22–5.81)
WBC: 5.1 10*3/uL (ref 4.0–10.5)

## 2013-03-14 MED ORDER — SACCHAROMYCES BOULARDII 250 MG PO CAPS
250.0000 mg | ORAL_CAPSULE | Freq: Two times a day (BID) | ORAL | Status: DC
  Administered 2013-03-14 – 2013-03-15 (×3): 250 mg via ORAL
  Filled 2013-03-14 (×4): qty 1

## 2013-03-14 MED ORDER — DIPHENOXYLATE-ATROPINE 2.5-0.025 MG PO TABS: 1.0000 | ORAL_TABLET | Freq: Four times a day (QID) | ORAL | Status: DC | PRN

## 2013-03-14 MED ORDER — CEFUROXIME AXETIL 500 MG PO TABS
500.0000 mg | ORAL_TABLET | Freq: Two times a day (BID) | ORAL | Status: DC
  Administered 2013-03-14 – 2013-03-15 (×2): 500 mg via ORAL
  Filled 2013-03-14 (×4): qty 1

## 2013-03-14 NOTE — Progress Notes (Addendum)
[facility] PROGRESS NOTE  MEARLE DREW ZOX:[id number] DOB: 04-09-1930 DOA: 03/11/2013 PCP: Gaspar Garbe, MD  Brief summary  79 yo M with history of COPD, GERD, anxiety and depression, gout, CVA, BPH and urinary outflow obstruction who presents with urosepsis due to E. Coli and AKI.  Blood cultures so far negative.  BP improving.  See discharge thoughts below.  Ready for discharge in several days.    Assessment/Plan  UTI/pyelonephritis, E.Coli and severe sepsis vs. septic shock given elevated creatinine with tachycardia and hypotension and mild AKI.  Fevers trending down and blood pressure improved -  Bood cultures NGTD -  Urine culture with E. Coli sensitive to cephalosporins>> will change abx to po, leukocytosis resolved  Acute on Chronic kidney injury, likely due to sepsis and UTI and responding to IVF and antibiotics -  RUS with cyst, no evidence of hydronephrosis -  resolved with Abx and IVF -  Continue to hold ACEI -  Continue flomax  Hypotension in the setting of normal hypertension, resolving -  Continue to hold blood pressure medications -  Continue zosyn  Acute hypoxic respiratory failure, no evidence of PNA on CXR.  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    Hyponatremia, resolving with hydration.  Continue IVF Leukocytosis, resolving Normocytic anemia, likely dilutional from IVF Thrombocytopenia, may have some dilutional effect vs. Mild DIC  Diarrhea -resolving, C.DIFF NEG.  Diet:  Healthy heart diet  Proph:  lovenox  Code Status: full Family Communication:  Patient and sonat bedside Disposition Plan:  To home with [facility] services per son's preference  Consultants:  None  Procedures:  CT  head  RUS pending  Antibiotics:  Vancomycin 8/18  Zosyn 8/18 >>20  CEFTIN 8/20>>  HPI/Subjective:  Patient states he had diarrhea yesterday, but none so far today Objective: Filed Vitals:   03/13/13 1500 03/13/13 1958 03/14/13 0322 03/14/13 0834  BP: 120/65 100/51 108/56   Pulse: 89 91 65   Temp: 98.2 F (36.8 C) 98.2 F (36.8 C) 99.7 F (37.6 C)   TempSrc: Oral Oral Oral   Resp: 18 18 20    Height:      Weight:   75.569 kg (166 lb 9.6 oz)   SpO2: 96% 95% 97% 96%    Intake/Output Summary (Last 24 hours) at 03/14/13 0915 Last data filed at 03/14/13 8119  Gross per 24 hour  Intake    100 ml  Output    200 ml  Net   -100 ml   Filed Weights   03/12/13 0346 03/13/13 0500 03/14/13 0322  Weight: 75.8 kg (167 lb 1.7 oz) 76.8 kg (169 lb 5 oz) 75.569 kg (166 lb 9.6 oz)    Exam:   General:  CM, mild respiratory distress, nasal canula in place  HEENT:  NCAT, MMM  Cardiovascular:  RRR, soft S1 and S2 with 1/6 rumbling systolic murmur at the LSB no rg, 2+ pulses, warm extremities  Respiratory:   Diminished bilateral breath sounds with full expiratory wheeze, no rales or rhonchi, occasional forced expiratory phase  Abdomen:   NABS, soft, NT/ND  MSK:   Decreased tone and bulk, no LEE  Neuro:  Grossly moves upper extremities, but 4-/5 strength bilateral lower extremities  Psych:  Alert, oriented to August, [place], but unclear about date and reason for being in the hospital.    Data Reviewed: Basic Metabolic Panel:  Recent Labs Lab 03/11/13 2116 03/12/13 0530 03/14/13 0540  NA 129* 132* 132*  K 4.7 4.0 4.0  CL 95* 100 101  CO2 21 21 21   GLUCOSE 142* 124* 106*  BUN 30* 28* 17  CREATININE 1.56* 1.40* 1.03  CALCIUM 9.2 8.3* 7.9*   Liver Function Tests:  Recent Labs Lab 03/11/13 2116 03/12/13 0530  AST 37 39*  ALT 18 17  ALKPHOS 66 54  BILITOT 0.6 0.4  PROT 7.1 6.0  ALBUMIN 3.2* 2.6*   No results found for this basename: LIPASE, AMYLASE,  in  the last 168 hours No results found for this basename: AMMONIA,  in the last 168 hours CBC:  Recent Labs Lab 03/11/13 2116 03/12/13 0530 03/14/13 0540  WBC 23.3* 16.2* 5.1  NEUTROABS 20.8* 14.6*  --   HGB 12.5* 11.2* 10.2*  HCT 37.1* 32.8* 29.8*  MCV 83.6 83.2 83.0  PLT 158 130* 91*   Cardiac Enzymes:  Recent Labs Lab 03/11/13 2116  TROPONINI <0.30   BNP (last 3 results) No results found for this basename: PROBNP,  in the last 8760 hours CBG:  Recent Labs Lab 03/11/13 2059 03/14/13 0625  GLUCAP 131* 106*    Recent Results (from the past 240 hour(s))  URINE CULTURE     Status: None   Collection Time    03/11/13 11:01 PM      Result Value Range Status   Specimen Description URINE, RANDOM   Final   Special Requests Normal   Final   Culture  Setup Time     Final   Value: 03/11/2013 23:49     Performed at [facility] Count     Final   Value: >=100,000 COLONIES/ML     Performed at [facility]   Culture     Final   Value: ESCHERICHIA COLI     Performed at [facility]   Report Status 03/13/2013 FINAL   Final   Organism ID, Bacteria ESCHERICHIA COLI   Final  CULTURE, BLOOD (ROUTINE X 2)     Status: None   Collection Time    03/12/13 12:05 AM      Result Value Range Status   Specimen Description BLOOD RIGHT ARM   Final   Special Requests BOTTLES DRAWN AEROBIC AND ANAEROBIC 10CC EA   Final   Culture  Setup Time     Final   Value: 03/12/2013 09:43     Performed at [facility]   Culture     Final   Value:        BLOOD CULTURE RECEIVED NO GROWTH TO DATE CULTURE WILL BE HELD FOR 5 DAYS BEFORE ISSUING A FINAL NEGATIVE REPORT     Performed at [facility]   Report Status PENDING   Incomplete  CULTURE, BLOOD (ROUTINE X 2)     Status: None   Collection Time    03/12/13  5:30 AM      Result Value Range Status   Specimen Description BLOOD LEFT ARM   Final   Special Requests     Final   Value: BOTTLES DRAWN AEROBIC  AND ANAEROBIC 6CC EA PATIENT ON FOLLOWING VANCOMYCIN,ZOSYN   Culture  Setup Time     Final   Value: 03/12/2013 11:02     Performed at [facility]   Culture     Final   Value:        BLOOD CULTURE RECEIVED NO GROWTH TO DATE CULTURE WILL BE HELD FOR 5 DAYS BEFORE ISSUING A FINAL NEGATIVE REPORT     Performed at [facility]   Report Status PENDING   Incomplete  MRSA PCR SCREENING     Status: Abnormal   Collection Time    03/12/13 11:15 AM      Result Value Range Status   MRSA by PCR POSITIVE (*) NEGATIVE Final   Comment:            The GeneXpert MRSA Assay (FDA     approved for NASAL specimens     only), is one component of a     comprehensive MRSA colonization     surveillance program. It is not     intended to diagnose MRSA     infection nor to guide or     monitor treatment for     MRSA infections.     RESULT CALLED TO, READ BACK BY AND VERIFIED WITH:     VERONICA BALDWIN,RN AT 1404 03/12/13 BY K BARR  CLOSTRIDIUM DIFFICILE BY PCR     Status: None   Collection Time    03/13/13  2:57 PM      Result Value Range Status   C difficile by pcr NEGATIVE  NEGATIVE Final     Studies: US Renal  03/13/2013   *RADIOLOGY REPORT*  Clinical Data: Acute kidney injury.  Renal insufficiency.  RENAL/URINARY TRACT ULTRASOUND COMPLETE  Comparison:  None.  Findings:  Right Kidney:  Measures 11.5 cm in length.  Within normal limits in parenchymal echogenicity.  A 3 cm cyst is seen in the lower pole of the right kidney, however there is no evidence of renal mass or hydronephrosis.  Left Kidney:  Measures 12.6 cm length.  Within normal limits in parenchymal echogenicity.  No evidence of renal mass or hydronephrosis.  Bladder:  Mildly enlarged prostate with mass effect on bladder base.  IMPRESSION:  1.  Normal sized kidneys.  No evidence of hydronephrosis. 2.  3 cm right renal cyst noted.  No renal mass visualized. 3.  Mildly enlarged prostate, with mass effect on bladder base.   Original  Report Authenticated By: Myles Rosenthal, M.D.    Scheduled Meds: . budesonide-formoterol  2 puff Inhalation BID  . Chlorhexidine Gluconate Cloth  6 each Topical Q0600  . mupirocin ointment  1 application Nasal BID  . pantoprazole  40 mg Oral Daily  . piperacillin-tazobactam (ZOSYN)  IV  3.375 g Intravenous Q8H  . sodium chloride  3 mL Intravenous Q12H  . tamsulosin  0.4 mg Oral QPC supper   Continuous Infusions:    Principal Problem:   Acute encephalopathy Active Problems:   UTI (lower urinary tract infection)   Hypotension   Hyponatremia   ARF (acute renal failure)    Time spent: 25 min    Shawntell Dixson C  [facility] Pager [phone]. If 7PM-7AM, please contact night-coverage at [email or web address], password [facility] 03/14/2013, 9:15 AM  LOS: 3 days

## 2013-03-14 NOTE — Progress Notes (Signed)
CSW spoke with son, Arnet Hofferber in patients room. Son stated they do not want SNF because "he wants his dad to be in a familiar environment". Son states that he talked to his sister and they are going to hire help at home. CSW passed this on to Case Manager who will follow up with son. CSW  is signing off. Please re consult if needed  Maree Krabbe, MSW, [place] [phone]

## 2013-03-14 NOTE — Progress Notes (Signed)
Physical Therapy Treatment Patient Details Name: [name] MRN: [id number] DOB: 04/23/1930 Today's Date: 03/14/2013 Time: 8295-6213 PT Time Calculation (min): 25 min  PT Assessment / Plan / Recommendation  History of Present Illness [name] is a 79 y.o. male was brought in to the ER by patient's family as patient was found to be increasingly confused since morning. In the ER patient was found to be febrile and UA was showing features of UTI. Patient also was hypotensive on arrival improved on 1 L normal saline bolus. Patient was started empirically on antibiotics for sepsis secondary to UTI.    PT Comments   Patient demonstrates significant progress towards PT goals compared to prior session. Patient is more alert and able to ambulate during this session. Will continue to work with patient and progress activity as tolerated.   Follow Up Recommendations  Home health PT;Supervision/Assistance - 24 hour           Equipment Recommendations  None recommended by PT    Recommendations for Other Services    Frequency Min 4X/week   Progress towards PT Goals Progress towards PT goals: Progressing toward goals  Plan Discharge plan needs to be updated    Precautions / Restrictions Precautions Precautions: Fall Precaution Comments: cognitive deficits. frequent diarrhea Restrictions Weight Bearing Restrictions: No   Pertinent Vitals/Pain Patient reports no pain    Mobility  Bed Mobility Bed Mobility: Supine to Sit;Sitting - Scoot to Edge of Bed Supine to Sit: 5: Supervision Sitting - Scoot to [place] of Bed: 5: Supervision Transfers Transfers: Sit to Stand;Stand to Sit Sit to Stand: 4: Min assist Stand to Sit: 4: Min assist Details for Transfer Assistance: some continued instabilitybut improvements compared to previous session Ambulation/Gait Ambulation/Gait Assistance: 4: Min assist Ambulation Distance (Feet): 160 Feet Assistive device: Rolling walker Gait Pattern: Step-through  pattern;Decreased stride length;Trunk flexed;Narrow base of support Gait velocity: decreased General Gait Details: unsteady      PT Goals (current goals can now be found in the care plan section) Acute Rehab PT Goals Patient Stated Goal: " To go home " PT Goal Formulation: With patient Time For Goal Achievement: 03/26/13 Potential to Achieve Goals: Fair  Visit Information  Last PT Received On: 03/14/13 Assistance Needed: +1 History of Present Illness: [name] is a 79 y.o. male was brought in to the ER by patient's family as patient was found to be increasingly confused since morning. In the ER patient was found to be febrile and UA was showing features of UTI. Patient also was hypotensive on arrival improved on 1 L normal saline bolus. Patient was started empirically on antibiotics for sepsis secondary to UTI.     Subjective Data  Subjective: I feel fair Patient Stated Goal: " To go home "   Cognition  Cognition Arousal/Alertness: Awake/alert Behavior During Therapy: WFL for tasks assessed/performed Overall Cognitive Status: Impaired/Different from baseline Area of Impairment: Attention;Memory;Following commands;Safety/judgement;Awareness;Problem solving Current Attention Level: Sustained Memory: Decreased recall of precautions;Decreased short-term memory Following Commands: Follows one step commands consistently Safety/Judgement: Decreased awareness of safety;Decreased awareness of deficits Awareness: Emergent Problem Solving: Requires verbal cues General Comments: Overall improving mental status today    Balance  Dynamic Sitting Balance Dynamic Sitting - Balance Support: Feet supported;During functional activity Dynamic Sitting - Level of Assistance: 5: Stand by assistance Dynamic Sitting - Balance Activities: Head control activities;Trunk control activities Dynamic Standing Balance Dynamic Standing - Balance Support: During functional activity Dynamic Standing -  Level of Assistance: 5: Stand  by assistance Dynamic Standing - Balance Activities: Lateral lean/weight shifting;Forward lean/weight shifting Dynamic Standing - Comments: using rw during hygiene  End of Session PT - End of Session Equipment Utilized During Treatment: Gait belt Activity Tolerance: Patient limited by fatigue Patient left: in chair;with call bell/phone within reach;with nursing/sitter in room Nurse Communication: Mobility status   GP     Fabio Asa 03/14/2013, 2:48 PM .Clementeen Graham

## 2013-03-14 NOTE — Progress Notes (Signed)
Ambulated 150 ft using rolling walker. Returned to bathroom to have loose stool. Patient cleaned and returned to bed. Sacral area is red; skin protective cream applied. Call bell near. [name]

## 2013-03-15 LAB — CBC
HCT: 31.7 % — ABNORMAL LOW (ref 39.0–52.0)
Hemoglobin: 10.9 g/dL — ABNORMAL LOW (ref 13.0–17.0)
WBC: 3.5 10*3/uL — ABNORMAL LOW (ref 4.0–10.5)

## 2013-03-15 LAB — BASIC METABOLIC PANEL
Chloride: 102 mEq/L (ref 96–112)
GFR calc Af Amer: 88 mL/min — ABNORMAL LOW (ref >90)
Potassium: 3.8 mEq/L (ref 3.5–5.1)
Sodium: 133 mEq/L — ABNORMAL LOW (ref 135–145)

## 2013-03-15 MED ORDER — LISINOPRIL 10 MG PO TABS: 10.0000 mg | ORAL_TABLET | Freq: Every day | ORAL | Status: DC

## 2013-03-15 MED ORDER — CEFUROXIME AXETIL 500 MG PO TABS: 500.0000 mg | ORAL_TABLET | Freq: Two times a day (BID) | ORAL | Status: DC

## 2013-03-15 MED ORDER — SACCHAROMYCES BOULARDII 250 MG PO CAPS: 250.0000 mg | ORAL_CAPSULE | Freq: Two times a day (BID) | ORAL | Status: DC

## 2013-03-15 NOTE — Progress Notes (Signed)
Progress,   Patient c/o hoarseness, in throat, Dr Donna Bernard made aware and no orders received at this time will continue to monitor patient. Glenys Snader, Randall An RN

## 2013-03-15 NOTE — Discharge Summary (Signed)
Physician Discharge Summary  [name] ZOX:[id number] DOB: 1930/05/31 DOA: 03/11/2013  PCP: Gaspar Garbe, MD  Admit date: 03/11/2013 Discharge date: 03/15/2013  Time spent: >30 minutes  Recommendations for Outpatient Follow-up:  Follow-up Information   Follow up with Gaspar Garbe, MD. (in 1 week, call for appt upon discharge)    Specialty:  Internal Medicine   Contact information:   [address] [facility] [place] [address] [phone]        Discharge Diagnoses:  Principal Problem:   Acute encephalopathy Active Problems:   UTI (lower urinary tract infection)   Hypotension   Hyponatremia   ARF (acute renal failure)   Discharge Condition: improved/stable  Diet recommendation: heart healthy  Filed Weights   03/13/13 0500 03/14/13 0322 03/15/13 0535  Weight: 76.8 kg (169 lb 5 oz) 75.569 kg (166 lb 9.6 oz) 74.571 kg (164 lb 6.4 oz)    History of present illness:  [name] is a 79 y.o. male was brought in to the ER by patient's family as patient was found to be increasingly confused since morning. In the ER patient was found to be febrile and UA was showing features of UTI. Patient also was hypotensive on arrival improved on 1 L normal saline bolus. Patient was started empirically on antibiotics for sepsis secondary to UTI. Patient mentation improved with start of IV fluids antibiotics and control of fever. On my exam patient is completely oriented and follows commands. Patient denies any chest pain shortness of breath nausea vomiting abdominal pain headache or any focal deficits. Patient will be admitted for further IV antibiotics and hydration. Patient blood pressure is low normal at this time.   Hospital Course:  UTI/pyelonephritis, E.Coli and severe sepsis vs. septic shock given elevated creatinine with tachycardia and hypotension and mild AKI.  -Upon admission blood and urine cultures were obtained and pt was started on empiric  antibiotics. -Fevers trended down and he defervesced and blood pressure improved  - Bood cultures NGTD  - Urine culture grew E. Coli sensitive to cephalosporins>> leukocytosis resolved abx were change to po and tolerated well and was d/ced on oral abx to follow up with his PCP. Acute on Chronic kidney injury, likely due to sepsis and UTI and responding to IVF and antibiotics  - Renal US showed a cyst, no evidence of hydronephrosis  - resolved with Abx, IVFand holding ACEI  -  flomax was Continued Hypotension in the setting of #1 resolving  -  blood pressure medications were held and he was treated with abx as above and he improved clinically. Acute hypoxic respiratory failure, no evidence of PNA on CXR. ddx includes atelectasis, mild ARDS, PE, acute COPD exacerbation  - clinically improved with incentive Spirometry, OOB  - the oxygen  was Weaned off as tolerated  - pt was also treated with Duonebs  COPD, active wheezing but mild hypoxia  - he was Started on  duonebs and Continued on symbicort. 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  Hyponatremia,improved with hydration.  Leukocytosis, resolved with treatment as above  Normocytic anemia, likely dilutional from IVF- was satble at 10.9 on d/c  Thrombocytopenia, may have some dilutional effect vs. Mild DIC. Plt ct was 101 on d/c with no gross bleeding  Diarrhea  -resolved, C.DIFF NEG.    Disposition Plan: To home with [facility] services per son's preference   Consultants:  None Procedures:  CT head  RUS pending    Consultations:  none  Discharge Exam: Filed Vitals:   03/15/13 1352  BP: 100/63  Pulse: 65  Temp: 98 F (36.7 C)  Resp: 18    Exam:  General: CM, in NAD, nasal canula in place  HEENT: NCAT, MMM  Cardiovascular: RRR, soft S1 and S2 with 1/6 rumbling systolic murmur at the LSB no rg, 2+ pulses, warm extremities  Respiratory:  Diminished bilateral breath sounds, no wheeze, no rales or rhonchi. Abdomen: NABS, soft, NT/ND  MSK: Decreased tone and bulk, no LE edema  Neuro: Grossly moves upper extremities, but 4-/5 strength bilateral lower extremities  Psych: Alert and appropriate   Discharge Instructions  Discharge Orders   Future Orders Complete By Expires   Diet Heart  As directed    Increase activity slowly  As directed        Medication List         budesonide-formoterol 80-4.5 MCG/ACT inhaler  Commonly known as:  SYMBICORT  Inhale 2 puffs into the lungs 2 (two) times daily.     cefUROXime 500 MG tablet  Commonly known as:  CEFTIN  Take 1 tablet (500 mg total) by mouth 2 (two) times daily with a meal.     lisinopril 10 MG tablet  Commonly known as:  PRINIVIL,ZESTRIL  Take 1 tablet (10 mg total) by mouth daily.  Start taking on:  03/16/2013     mirtazapine 30 MG tablet  Commonly known as:  REMERON  Take 30 mg by mouth at bedtime.     omeprazole 20 MG capsule  Commonly known as:  PRILOSEC  Take 20 mg by mouth daily.     saccharomyces boulardii 250 MG capsule  Commonly known as:  FLORASTOR  Take 1 capsule (250 mg total) by mouth 2 (two) times daily.     tamsulosin 0.4 MG Caps capsule  Commonly known as:  FLOMAX  Take 0.4 mg by mouth.     VENTOLIN HFA IN  Inhale 2 puffs into the lungs as needed.     VITAMIN D PO  Take 1 tablet by mouth daily.       Allergies  Allergen Reactions  . Contrast Media [Iodinated Diagnostic Agents]        Follow-up Information   Follow up with Gaspar Garbe, MD. (in 1 week, call for appt upon discharge)    Specialty:  Internal Medicine   Contact information:   [address] [facility] [place] [address] [phone]        The results of significant diagnostics from this hospitalization (including imaging, microbiology, ancillary and laboratory) are listed below for reference.    Significant Diagnostic  Studies: Dg Chest 2 View  03/11/2013   *RADIOLOGY REPORT*  Clinical Data: Elevated white cell count and altered mental status.  CHEST - 2 VIEW  Comparison: 04/15/2011  Findings: Hyperinflation suggesting emphysema.  Normal heart size and pulmonary vascularity.  Slight fibrosis in the lung bases.  No focal consolidation or airspace disease.  No blunting of costophrenic angles.  No pneumothorax.  Calcified and tortuous aorta.  Mild degenerative changes in the spine.  No significant change since the previous study.  IMPRESSION: Emphysematous changes in the lungs.  No evidence of active pulmonary disease.   Original Report Authenticated By: Burman Nieves, M.D.   Ct Head (brain) Wo Contrast  03/11/2013   *RADIOLOGY REPORT*  Clinical Data: The patient was found on the floor at home.  Confusion and difficulty walking.  History of previous stroke with right-sided deficits.  CT HEAD WITHOUT CONTRAST  Technique:  Contiguous axial images were obtained from the base of the skull through the vertex without contrast.  Comparison: 04/19/2011  Findings: The diffuse cerebral atrophy.  Low attenuation changes in the deep white matter consistent with small vessel ischemia.  Focal area of encephalomalacia involving the left parietal region consistent with old infarct.  The hemorrhage seen in this location on the previous study has resolved.  There is also a focal old encephalomalacia in the right occipital region which is stable since the previous study.  There is asymmetric dilatation of the left lateral ventricle consistent with volume loss in the area of previous infarct.  No abnormal extra-axial fluid collections.  Wallace Cullens- white matter junctions are not effaced. Basal cisterns are not effaced.  No evidence of acute intracranial hemorrhage. Metallic structure in the right intracranial internal carotid artery consistent with a stent.  This is stable.  Calcification in the carotid arteries.  No depressed skull fractures.   Retention cyst or polyps in the maxillary antra.  There is increased density in the dura at the level of C1.  This could represent dural calcification or thickening due to hemorrhage or infection.  Consider additional examination of the cervical spine for further evaluation.  IMPRESSION: No acute intracranial abnormalities.  Old infarcts in the left parietal and right occipital region.  No acute intracranial hemorrhage.  Nonspecific increased density demonstrated in the dura of the visualized upper cervical region.   Original Report Authenticated By: Burman Nieves, M.D.   US Renal  03/13/2013   *RADIOLOGY REPORT*  Clinical Data: Acute kidney injury.  Renal insufficiency.  RENAL/URINARY TRACT ULTRASOUND COMPLETE  Comparison:  None.  Findings:  Right Kidney:  Measures 11.5 cm in length.  Within normal limits in parenchymal echogenicity.  A 3 cm cyst is seen in the lower pole of the right kidney, however there is no evidence of renal mass or hydronephrosis.  Left Kidney:  Measures 12.6 cm length.  Within normal limits in parenchymal echogenicity.  No evidence of renal mass or hydronephrosis.  Bladder:  Mildly enlarged prostate with mass effect on bladder base.  IMPRESSION:  1.  Normal sized kidneys.  No evidence of hydronephrosis. 2.  3 cm right renal cyst noted.  No renal mass visualized. 3.  Mildly enlarged prostate, with mass effect on bladder base.   Original Report Authenticated By: Myles Rosenthal, M.D.    Microbiology: Recent Results (from the past 240 hour(s))  URINE CULTURE     Status: None   Collection Time    03/11/13 11:01 PM      Result Value Range Status   Specimen Description URINE, RANDOM   Final   Special Requests Normal   Final   Culture  Setup Time     Final   Value: 03/11/2013 23:49     Performed at [facility] Count     Final   Value: >=100,000 COLONIES/ML     Performed at [facility]   Culture     Final   Value: ESCHERICHIA COLI     Performed at  [facility]   Report Status 03/13/2013 FINAL   Final   Organism ID, Bacteria ESCHERICHIA COLI   Final  CULTURE, BLOOD (ROUTINE X 2)     Status: None   Collection Time    03/12/13 12:05 AM      Result Value Range  Status   Specimen Description BLOOD RIGHT ARM   Final   Special Requests BOTTLES DRAWN AEROBIC AND ANAEROBIC 10CC EA   Final   Culture  Setup Time     Final   Value: 03/12/2013 09:43     Performed at [facility]   Culture     Final   Value:        BLOOD CULTURE RECEIVED NO GROWTH TO DATE CULTURE WILL BE HELD FOR 5 DAYS BEFORE ISSUING A FINAL NEGATIVE REPORT     Performed at [facility]   Report Status PENDING   Incomplete  CULTURE, BLOOD (ROUTINE X 2)     Status: None   Collection Time    03/12/13  5:30 AM      Result Value Range Status   Specimen Description BLOOD LEFT ARM   Final   Special Requests     Final   Value: BOTTLES DRAWN AEROBIC AND ANAEROBIC 6CC EA PATIENT ON FOLLOWING VANCOMYCIN,ZOSYN   Culture  Setup Time     Final   Value: 03/12/2013 11:02     Performed at [facility]   Culture     Final   Value:        BLOOD CULTURE RECEIVED NO GROWTH TO DATE CULTURE WILL BE HELD FOR 5 DAYS BEFORE ISSUING A FINAL NEGATIVE REPORT     Performed at [facility]   Report Status PENDING   Incomplete  MRSA PCR SCREENING     Status: Abnormal   Collection Time    03/12/13 11:15 AM      Result Value Range Status   MRSA by PCR POSITIVE (*) NEGATIVE Final   Comment:            The GeneXpert MRSA Assay (FDA     approved for NASAL specimens     only), is one component of a     comprehensive MRSA colonization     surveillance program. It is not     intended to diagnose MRSA     infection nor to guide or     monitor treatment for     MRSA infections.     RESULT CALLED TO, READ BACK BY AND VERIFIED WITH:     VERONICA BALDWIN,RN AT 1404 03/12/13 BY K BARR  CLOSTRIDIUM DIFFICILE BY PCR     Status: None   Collection Time    03/13/13   2:57 PM      Result Value Range Status   C difficile by pcr NEGATIVE  NEGATIVE Final     Labs: Basic Metabolic Panel:  Recent Labs Lab 03/11/13 2116 03/12/13 0530 03/14/13 0540 03/15/13 0500  NA 129* 132* 132* 133*  K 4.7 4.0 4.0 3.8  CL 95* 100 101 102  CO2 21 21 21 21   GLUCOSE 142* 124* 106* 110*  BUN 30* 28* 17 18  CREATININE 1.56* 1.40* 1.03 0.95  CALCIUM 9.2 8.3* 7.9* 8.2*   Liver Function Tests:  Recent Labs Lab 03/11/13 2116 03/12/13 0530  AST 37 39*  ALT 18 17  ALKPHOS 66 54  BILITOT 0.6 0.4  PROT 7.1 6.0  ALBUMIN 3.2* 2.6*   No results found for this basename: LIPASE, AMYLASE,  in the last 168 hours No results found for this basename: AMMONIA,  in the last 168 hours CBC:  Recent Labs Lab 03/11/13 2116 03/12/13 0530 03/14/13 0540 03/15/13 0500  WBC 23.3* 16.2* 5.1 3.5*  NEUTROABS 20.8* 14.6*  --   --  HGB 12.5* 11.2* 10.2* 10.9*  HCT 37.1* 32.8* 29.8* 31.7*  MCV 83.6 83.2 83.0 81.9  PLT 158 130* 91* 101*   Cardiac Enzymes:  Recent Labs Lab 03/11/13 2116  TROPONINI <0.30   BNP: BNP (last 3 results) No results found for this basename: PROBNP,  in the last 8760 hours CBG:  Recent Labs Lab 03/11/13 2059 03/14/13 0625  GLUCAP 131* 106*       Signed:  Zamorah Ailes C  [facility] 03/15/2013, 2:02 PM

## 2013-03-15 NOTE — Progress Notes (Signed)
patietn given discharge instructions with family , avs and paper prescritpions. Will discharge home with family. All questions answered. Amadeo Coke, Randall An RN

## 2013-03-15 NOTE — Progress Notes (Signed)
Physical Therapy Treatment Patient Details Name: [name] MRN: [id number] DOB: [birth year] Today's Date: 03/15/2013 Time: 4098-1191 PT Time Calculation (min): 28 min  PT Assessment / Plan / Recommendation  History of Present Illness [name] is a 79 y.o. male was brought in to the ER by patient's family as patient was found to be increasingly confused since morning. In the ER patient was found to be febrile and UA was showing features of UTI. Patient also was hypotensive on arrival improved on 1 L normal saline bolus. Patient was started empirically on antibiotics for sepsis secondary to UTI.    PT Comments   Patient demonstrates continued progress towards PT goals at this time. Patient with better stability ambulating, able to tolerated there-ex well and demonstrates significant improvements in overall cognition. Spoke at length with patient and daughter in law regarding expectations for mobility upon discharge as well as activities for home management. Patient is progressing well. Will continue to see as indicated, feel patient will be safe for dc home with family supervision and HHPT.   Follow Up Recommendations  Home health PT;Supervision/Assistance - 24 hour           Equipment Recommendations  None recommended by PT       Frequency Min 4X/week   Progress towards PT Goals  Progressing towards goals  Plan Current plan remains appropriate    Precautions / Restrictions Precautions Precautions: Fall Precaution Comments: cognitive deficits. frequent diarrhea Restrictions Weight Bearing Restrictions: No   Pertinent Vitals/Pain Patient reports some low back pain (provided patient with towel roll for lumbar support)    Mobility  Bed Mobility Bed Mobility: Supine to Sit;Sitting - Scoot to Edge of Bed Supine to Sit: 5: Supervision Sitting - Scoot to [place] of Bed: 5: Supervision Transfers Transfers: Sit to Stand;Stand to Sit Sit to Stand: 5: Supervision Stand to Sit: 5:  Supervision Details for Transfer Assistance: No physical assist needed performed x 3 Ambulation/Gait Ambulation/Gait Assistance: 5: Supervision Ambulation Distance (Feet): 160 Feet Assistive device: Rolling walker Ambulation/Gait Assistance Details: no physical assist needed Gait Pattern: Step-through pattern;Decreased stride length;Trunk flexed;Narrow base of support Gait velocity: decreased General Gait Details: increased stability today with use of rw    Exercises General Exercises - Lower Extremity Ankle Circles/Pumps: AROM;Both;10 reps Long Arc Quad: AROM;Both;10 reps Mini-Sqauts: AROM;Both;10 reps     PT Goals (current goals can now be found in the care plan section) Acute Rehab PT Goals Patient Stated Goal: " To go home " PT Goal Formulation: With patient Time For Goal Achievement: 03/26/13 Potential to Achieve Goals: Good  Visit Information  Last PT Received On: 03/15/13 Assistance Needed: +1 History of Present Illness: [name] is a 79 y.o. male was brought in to the ER by patient's family as patient was found to be increasingly confused since morning. In the ER patient was found to be febrile and UA was showing features of UTI. Patient also was hypotensive on arrival improved on 1 L normal saline bolus. Patient was started empirically on antibiotics for sepsis secondary to UTI.     Subjective Data  Subjective: I feel fair Patient Stated Goal: " To go home "   Cognition  Cognition Arousal/Alertness: Awake/alert Behavior During Therapy: WFL for tasks assessed/performed Overall Cognitive Status: Within Functional Limits for tasks assessed General Comments: Significant improvements in cognition     Balance  Balance Balance Assessed: Yes Dynamic Sitting Balance Dynamic Sitting - Balance Support: Feet supported;During functional activity Dynamic Sitting -  Level of Assistance: 7: Independent Dynamic Sitting - Balance Activities: Head control activities;Trunk  control activities Static Standing Balance Static Standing - Balance Support: Bilateral upper extremity supported;During functional activity Static Standing - Level of Assistance: 5: Stand by assistance Static Standing - Comment/# of Minutes: 4 minutes  End of Session PT - End of Session Equipment Utilized During Treatment: Gait belt Activity Tolerance: Patient limited by fatigue Patient left: in chair;with call bell/phone within reach;with nursing/sitter in room Nurse Communication: Mobility status   GP     Fabio Asa 03/15/2013, 10:32 AM Charlotte Crumb, PT DPT  [phone]

## 2013-03-18 LAB — CULTURE, BLOOD (ROUTINE X 2)
Culture: NO GROWTH
Culture: NO GROWTH

## 2013-03-31 ENCOUNTER — Emergency Department (HOSPITAL_COMMUNITY): Payer: Medicare Other

## 2013-03-31 ENCOUNTER — Other Ambulatory Visit: Payer: Self-pay

## 2013-03-31 ENCOUNTER — Emergency Department (HOSPITAL_COMMUNITY)
Admission: EM | Admit: 2013-03-31 | Discharge: 2013-03-31 | Disposition: A | Discharge status: Home / Self Care | Payer: Medicare Other | Attending: Emergency Medicine | Admitting: Emergency Medicine

## 2013-03-31 ENCOUNTER — Encounter (HOSPITAL_COMMUNITY): Payer: Self-pay | Admitting: Nurse Practitioner

## 2013-03-31 DIAGNOSIS — Z8639 Personal history of other endocrine, nutritional and metabolic disease: Secondary | ICD-10-CM | POA: Insufficient documentation

## 2013-03-31 DIAGNOSIS — Z79899 Other long term (current) drug therapy: Secondary | ICD-10-CM | POA: Insufficient documentation

## 2013-03-31 DIAGNOSIS — R109 Unspecified abdominal pain: Secondary | ICD-10-CM | POA: Insufficient documentation

## 2013-03-31 DIAGNOSIS — Z87891 Personal history of nicotine dependence: Secondary | ICD-10-CM | POA: Insufficient documentation

## 2013-03-31 DIAGNOSIS — F411 Generalized anxiety disorder: Secondary | ICD-10-CM | POA: Insufficient documentation

## 2013-03-31 DIAGNOSIS — I10 Essential (primary) hypertension: Secondary | ICD-10-CM | POA: Insufficient documentation

## 2013-03-31 DIAGNOSIS — F3289 Other specified depressive episodes: Secondary | ICD-10-CM | POA: Insufficient documentation

## 2013-03-31 DIAGNOSIS — K219 Gastro-esophageal reflux disease without esophagitis: Secondary | ICD-10-CM | POA: Insufficient documentation

## 2013-03-31 DIAGNOSIS — Z8739 Personal history of other diseases of the musculoskeletal system and connective tissue: Secondary | ICD-10-CM | POA: Insufficient documentation

## 2013-03-31 DIAGNOSIS — J4489 Other specified chronic obstructive pulmonary disease: Secondary | ICD-10-CM | POA: Insufficient documentation

## 2013-03-31 DIAGNOSIS — Z8701 Personal history of pneumonia (recurrent): Secondary | ICD-10-CM | POA: Insufficient documentation

## 2013-03-31 DIAGNOSIS — Z862 Personal history of diseases of the blood and blood-forming organs and certain disorders involving the immune mechanism: Secondary | ICD-10-CM | POA: Insufficient documentation

## 2013-03-31 DIAGNOSIS — J449 Chronic obstructive pulmonary disease, unspecified: Secondary | ICD-10-CM | POA: Insufficient documentation

## 2013-03-31 DIAGNOSIS — F329 Major depressive disorder, single episode, unspecified: Secondary | ICD-10-CM | POA: Insufficient documentation

## 2013-03-31 DIAGNOSIS — Z8546 Personal history of malignant neoplasm of prostate: Secondary | ICD-10-CM | POA: Insufficient documentation

## 2013-03-31 DIAGNOSIS — Z8673 Personal history of transient ischemic attack (TIA), and cerebral infarction without residual deficits: Secondary | ICD-10-CM | POA: Insufficient documentation

## 2013-03-31 LAB — LIPASE, BLOOD: Lipase: 26 U/L (ref 11–59)

## 2013-03-31 LAB — CBC WITH DIFFERENTIAL/PLATELET
Basophils Relative: 0 % (ref 0–1)
Eosinophils Relative: 4 % (ref 0–5)
HCT: 35.1 % — ABNORMAL LOW (ref 39.0–52.0)
Hemoglobin: 11.8 g/dL — ABNORMAL LOW (ref 13.0–17.0)
MCHC: 33.6 g/dL (ref 30.0–36.0)
MCV: 85.6 fL (ref 78.0–100.0)
Monocytes Absolute: 0.6 10*3/uL (ref 0.1–1.0)
Monocytes Relative: 9 % (ref 3–12)
Neutro Abs: 3.9 10*3/uL (ref 1.7–7.7)

## 2013-03-31 LAB — COMPREHENSIVE METABOLIC PANEL
BUN: 17 mg/dL (ref 6–23)
CO2: 25 mEq/L (ref 19–32)
Chloride: 101 mEq/L (ref 96–112)
Creatinine, Ser: 0.93 mg/dL (ref 0.50–1.35)
GFR calc non Af Amer: 77 mL/min — ABNORMAL LOW (ref >90)
Total Bilirubin: 0.4 mg/dL (ref 0.3–1.2)

## 2013-03-31 LAB — POCT I-STAT TROPONIN I: Troponin i, poc: 0.01 ng/mL (ref 0.00–0.08)

## 2013-03-31 LAB — URINALYSIS, ROUTINE W REFLEX MICROSCOPIC
Bilirubin Urine: NEGATIVE
Glucose, UA: NEGATIVE mg/dL
Hgb urine dipstick: NEGATIVE
Ketones, ur: NEGATIVE mg/dL
Protein, ur: NEGATIVE mg/dL

## 2013-03-31 MED ORDER — TRAMADOL HCL 50 MG PO TABS: 50.0000 mg | ORAL_TABLET | Freq: Four times a day (QID) | ORAL | Status: DC | PRN

## 2013-03-31 MED ORDER — SODIUM CHLORIDE 0.9 % IV SOLN
Freq: Once | INTRAVENOUS | Status: AC
  Administered 2013-03-31: 16:00:00 via INTRAVENOUS

## 2013-03-31 MED ORDER — MORPHINE SULFATE 4 MG/ML IJ SOLN
4.0000 mg | Freq: Once | INTRAMUSCULAR | Status: AC
  Administered 2013-03-31: 4 mg via INTRAVENOUS
  Filled 2013-03-31: qty 1

## 2013-03-31 MED ORDER — ACETAMINOPHEN 325 MG PO TABS
650.0000 mg | ORAL_TABLET | Freq: Once | ORAL | Status: AC
  Administered 2013-03-31: 650 mg via ORAL
  Filled 2013-03-31: qty 2

## 2013-03-31 MED ORDER — ONDANSETRON HCL 4 MG/2ML IJ SOLN
4.0000 mg | Freq: Once | INTRAMUSCULAR | Status: AC
  Administered 2013-03-31: 4 mg via INTRAVENOUS
  Filled 2013-03-31: qty 2

## 2013-03-31 NOTE — ED Provider Notes (Signed)
CSN: [id number]     Arrival date & time 03/31/13  1345 History   First MD Initiated Contact with Patient 03/31/13 1539     Chief Complaint  Patient presents with  . Abdominal Pain   (Consider location/radiation/quality/duration/timing/severity/associated sxs/prior Treatment) Patient is a 79 y.o. male presenting with abdominal pain. The history is provided by the patient.  Abdominal Pain He had onset last night of abdominal pain which is worse on the left side. Her he states it came is crampy and was as severe as 8/10. Current pain is 6/10. Pain is worse with movement but nothing makes it better. There is associated nausea but no vomiting. Denies constipation or diarrhea. Denies urinary symptoms. He states that he had an ultrasound yesterday and he pushed very hard of his ribs and wonders if that might be related to his pain. He denies fever or sweats but did have mild chills. He has had history of frequent urinary tract infections and had been hospitalized last month for urosepsis.  Past Medical History  Diagnosis Date  . COPD (chronic obstructive pulmonary disease)   . GERD (gastroesophageal reflux disease)   . Anxiety and depression   . Gout   . CVA (cerebral infarction) 2012    Left parietal ICH; "left him weaker on the right side" (03/13/2013)  . GIB (gastrointestinal bleeding) 04/12  . Diverticulosis   . Family history of anesthesia complication     "makes son nauseous" (03/13/2013)  . Hypertension   . Pneumonia     "several times; last time 12-18 months ago"  (03/13/2013)  . Exertional shortness of breath   . Arthritis     "neck; hands" (03/13/2013)  . Prostate cancer    Past Surgical History  Procedure Laterality Date  . Prostate surgery    . Total knee arthroplasty Right   . Shoulder surgery Right 2003    decompression; "fell and broke it years ago; doesn't have plates or screws in there;" (03/13/2013)  . Carotid stent insertion Bilateral    Family History  Problem Relation  Age of Onset  . Cancer Father   . Diabetes Sister   . Heart disease Sister   . Hyperlipidemia Sister   . Hypertension Sister    History  Substance Use Topics  . Smoking status: Former Smoker -- 1.50 packs/day for 54 years    Types: Cigarettes  . Smokeless tobacco: Never Used  . Alcohol Use: Yes     Comment: 03/13/2013 "hadn't had a beer in 4-5 years; never had problem w/alcohol"    Review of Systems  Gastrointestinal: Positive for abdominal pain.  All other systems reviewed and are negative.    Allergies  Contrast media  Home Medications   Current Outpatient Rx  Name  Route  Sig  Dispense  Refill  . Albuterol Sulfate (VENTOLIN HFA IN)   Inhalation   Inhale 2 puffs into the lungs as needed.           . budesonide-formoterol (SYMBICORT) 80-4.5 MCG/ACT inhaler   Inhalation   Inhale 2 puffs into the lungs 2 (two) times daily.         . cefUROXime (CEFTIN) 500 MG tablet   Oral   Take 1 tablet (500 mg total) by mouth 2 (two) times daily with a meal.   12 tablet   0     FOR days   . Cholecalciferol (VITAMIN D PO)   Oral   Take 1 tablet by mouth daily.         Marland Kitchen  lisinopril (PRINIVIL,ZESTRIL) 10 MG tablet   Oral   Take 1 tablet (10 mg total) by mouth daily.         . mirtazapine (REMERON) 30 MG tablet   Oral   Take 30 mg by mouth at bedtime.           Marland Kitchen omeprazole (PRILOSEC) 20 MG capsule   Oral   Take 20 mg by mouth daily.           Marland Kitchen saccharomyces boulardii (FLORASTOR) 250 MG capsule   Oral   Take 1 capsule (250 mg total) by mouth 2 (two) times daily.   30 capsule   0   . tamsulosin (FLOMAX) 0.4 MG CAPS capsule   Oral   Take 0.4 mg by mouth.          BP 99/57  Pulse 75  Temp(Src) 98.1 F (36.7 C) (Oral)  Resp 16  SpO2 99% Physical Exam  Nursing note and vitals reviewed.  79 year old male, resting comfortably and in no acute distress. Vital signs are normal. Oxygen saturation is 99%, which is normal. Head is normocephalic  and atraumatic. PERRLA, EOMI. Oropharynx is clear. Neck is nontender and supple without adenopathy or JVD. Back is nontender and there is no CVA tenderness. Lungs are clear without rales, wheezes, or rhonchi. Chest is nontender. Heart has regular rate and rhythm without murmur. Abdomen is soft, flat, with moderate tenderness throughout the left side of the abdomen. There is no rebound or guarding. There are no masses or hepatosplenomegaly and peristalsis is hypoactive. Extremities have no cyanosis or edema, full range of motion is present. Skin is warm and dry without rash. Neurologic: Mental status is normal, cranial nerves are intact, there are no motor or sensory deficits.  ED Course  Procedures (including critical care time) Labs Reviewed Results for orders placed during the hospital encounter of 03/31/13  URINALYSIS, ROUTINE W REFLEX MICROSCOPIC      Result Value Range   Color, Urine YELLOW  YELLOW   APPearance CLEAR  CLEAR   Specific Gravity, Urine 1.020  1.005 - 1.030   pH 5.5  5.0 - 8.0   Glucose, UA NEGATIVE  NEGATIVE mg/dL   Hgb urine dipstick NEGATIVE  NEGATIVE   Bilirubin Urine NEGATIVE  NEGATIVE   Ketones, ur NEGATIVE  NEGATIVE mg/dL   Protein, ur NEGATIVE  NEGATIVE mg/dL   Urobilinogen, UA 0.2  0.0 - 1.0 mg/dL   Nitrite NEGATIVE  NEGATIVE   Leukocytes, UA NEGATIVE  NEGATIVE  LIPASE, BLOOD      Result Value Range   Lipase 26  11 - 59 U/L  CBC WITH DIFFERENTIAL      Result Value Range   WBC 6.0  4.0 - 10.5 K/uL   RBC 4.10 (*) 4.22 - 5.81 MIL/uL   Hemoglobin 11.8 (*) 13.0 - 17.0 g/dL   HCT 57.8 (*) 46.9 - 62.9 %   MCV 85.6  78.0 - 100.0 fL   MCH 28.8  26.0 - 34.0 pg   MCHC 33.6  30.0 - 36.0 g/dL   RDW 52.8  41.3 - 24.4 %   Platelets 138 (*) 150 - 400 K/uL   Neutrophils Relative % 64  43 - 77 %   Neutro Abs 3.9  1.7 - 7.7 K/uL   Lymphocytes Relative 23  12 - 46 %   Lymphs Abs 1.4  0.7 - 4.0 K/uL   Monocytes Relative 9  3 - 12 %   Monocytes Absolute 0.6  0.1 -  1.0 K/uL   Eosinophils Relative 4  0 - 5 %   Eosinophils Absolute 0.2  0.0 - 0.7 K/uL   Basophils Relative 0  0 - 1 %   Basophils Absolute 0.0  0.0 - 0.1 K/uL  COMPREHENSIVE METABOLIC PANEL      Result Value Range   Sodium 136  135 - 145 mEq/L   Potassium 4.5  3.5 - 5.1 mEq/L   Chloride 101  96 - 112 mEq/L   CO2 25  19 - 32 mEq/L   Glucose, Bld 99  70 - 99 mg/dL   BUN 17  6 - 23 mg/dL   Creatinine, Ser 7.82  0.50 - 1.35 mg/dL   Calcium 9.2  8.4 - 95.6 mg/dL   Total Protein 7.3  6.0 - 8.3 g/dL   Albumin 3.2 (*) 3.5 - 5.2 g/dL   AST 16  0 - 37 U/L   ALT 11  0 - 53 U/L   Alkaline Phosphatase 68  39 - 117 U/L   Total Bilirubin 0.4  0.3 - 1.2 mg/dL   GFR calc non Af Amer 77 (*) >90 mL/min   GFR calc Af Amer 89 (*) >90 mL/min  POCT I-STAT TROPONIN I      Result Value Range   Troponin i, poc 0.01  0.00 - 0.08 ng/mL   Comment 3            Ct Abdomen Pelvis Wo Contrast  03/31/2013   *RADIOLOGY REPORT*  Clinical Data:  Right and left side abdominal pain  CT ABDOMEN AND PELVIS WITHOUT CONTRAST  Technique:  Multidetector CT imaging of the abdomen and pelvis was performed following the standard protocol without intravenous contrast. Sagittal and coronal MPR images reconstructed from axial data set.  Comparison: 10/26/2010  Findings: Bibasilar atelectasis. Bowel interposition between liver and diaphragm. Large cyst left lobe liver 3 x 1 x 2.3 cm image 12. Additional tiny cyst superiorly in liver image 9. Low attenuation cyst at inferior pole right kidney 3.1 x 2.3 cm image 33. 10 x 9 mm diameter probable high attenuation cyst at upper to mid right kidney image 21 unchanged. Within limits of a nonenhanced exam no additional focal abnormalities of the liver, spleen, pancreas, kidneys, or adrenal glands.  Scattered atherosclerotic calcifications aorta and iliac arteries without aneurysm. Slightly increased stool proximal colon. Sigmoid diverticulosis without evidence of diverticulitis. Stomach and bowel  loops otherwise normal appearance. No mass, adenopathy, free fluid inflammatory process. Unremarkable bladder ureters with mild enlargement prostate gland. Bones demineralized. Bulging versus herniated discs at L2-L3 and L3-L4.  IMPRESSION: No acute intra-abdominal or intrapelvic abnormalities. Bibasilar atelectasis. Scattered stable intra-abdominal and intrapelvic findings as above.   Original Report Authenticated By: Ulyses Southward, M.D.    Date: 03/31/2013  Rate: 75  Rhythm: normal sinus rhythm  QRS Axis: normal  Intervals: normal  ST/T Wave abnormalities: normal  Conduction Disutrbances:right bundle branch block  Narrative Interpretation:   Old EKG Reviewed: unchanged   MDM   1. Abdominal pain    A set of abdominal pain worrisome for diverticulitis. Ultrasound was done at in [place] and results are not available however he did have a renal ultrasound done 3 weeks ago which was significant for a renal cyst and mildly enlarged prostate.. had a CT of his abdomen in April which showed evidence of diverticulosis. Urinalysis is needed to rule out recurrent UTI which could also cause similar pain. Of note, rectal temperature does show a low-grade fever.  Workup is completely unremarkable. Urinalysis is normal and WBC is normal. CT of the abdomen is unremarkable. Patient is discharged with prescription for tramadol for pain and is to followup with his PCP.  Dione Booze, MD 03/31/13 2003

## 2013-03-31 NOTE — ED Notes (Signed)
Pt had abd ultrasound yesterday and 'they were pushing on my ribcage really hard." reports since the ultrasound hes been having R and L sided pain, "especially in my ribs and it hurts to breathe." pt is A&Ox4, breathing easily

## 2013-05-07 ENCOUNTER — Ambulatory Visit (INDEPENDENT_AMBULATORY_CARE_PROVIDER_SITE_OTHER): Payer: Medicare Other | Admitting: Critical Care Medicine

## 2013-05-07 ENCOUNTER — Encounter: Payer: Self-pay | Admitting: Critical Care Medicine

## 2013-05-07 VITALS — BP 104/60 | HR 62 | Temp 98.2°F | Ht 73.0 in | Wt 166.0 lb

## 2013-05-07 DIAGNOSIS — J438 Other emphysema: Secondary | ICD-10-CM

## 2013-05-07 DIAGNOSIS — J449 Chronic obstructive pulmonary disease, unspecified: Secondary | ICD-10-CM

## 2013-05-07 DIAGNOSIS — J439 Emphysema, unspecified: Secondary | ICD-10-CM

## 2013-05-07 MED ORDER — PREDNISONE 10 MG PO TABS: ORAL_TABLET | ORAL | Status: DC

## 2013-05-07 NOTE — Patient Instructions (Addendum)
Prednisone 10mg  Take 4 tablets daily for 5 days then stop Stay on symbicort two puff twice daily Use albuterol as needed No other medication changes Return 4 months

## 2013-05-07 NOTE — Assessment & Plan Note (Signed)
Gold stage B. COPD with history of recurrent exacerbations now with mild re exacerbation Note the patient had not been using Symbicort on a regular basis Plan Prednisone 10mg  Take 4 tablets daily for 5 days then stop Stay on symbicort two puff twice daily Use albuterol as needed No other medication changes Return 4 months

## 2013-05-07 NOTE — Progress Notes (Signed)
  Subjective:    Patient ID: [name], male    DOB: 07-Jan-1930, 79 y.o.   MRN: [id number]  HPI  05/07/2013 Chief Complaint  Patient presents with  . COPD    Breathing is unchanged. Reports SOB, coughing. Denies chest tightness or wheezing.   Pt hosp for sepsis over the summer , rx now flomax . UTI/ ecoli with sepsis.    Ecoli on urine after d/c and another ABX rx. Last few weeks bad.  Pt back at home on d/c. Pt lives with son. Now dyspnea but is about the same. Two weeks ago not as good as before.  ACE inhibitor has been stopped  Review of Systems  11 pt Ros neg    Objective:   Physical Exam   Filed Vitals:   05/07/13 1125  BP: 104/60  Pulse: 62  Temp: 98.2 F (36.8 C)  TempSrc: Oral  Height: 6\' 1"  (1.854 m)  Weight: 166 lb (75.297 kg)  SpO2: 96%    Gen: Pleasant, well-nourished, in no distress,  normal affect  ENT: No lesions,  mouth clear,  oropharynx clear, no postnasal drip  Neck: No JVD, no TMG, no carotid bruits  Lungs: No use of accessory muscles, no dullness to percussion, distant BS, few expiratory wheezes  Cardiovascular: RRR, heart sounds normal, no murmur or gallops, no peripheral edema  Abdomen: soft and NT, no HSM,  BS normal  Musculoskeletal: No deformities, no cyanosis or clubbing  Neuro: alert, non focal  Skin: Warm, no lesions or rashes  Spirometry 05/07/2013: FEV1 65% predicted FVC 90% predicted FEF 25 75  28% predicted FEV1 to FVC ratio 54% predicted      Assessment & Plan:   COPD with emphysema Gold B Gold stage B. COPD with history of recurrent exacerbations now with mild re exacerbation Note the patient had not been using Symbicort on a regular basis Plan Prednisone 10mg  Take 4 tablets daily for 5 days then stop Stay on symbicort two puff twice daily Use albuterol as needed No other medication changes Return 4 months      Updated Medication List Outpatient Encounter Prescriptions as of 05/07/2013  Medication Sig  Dispense Refill  . albuterol (PROVENTIL HFA;VENTOLIN HFA) 108 (90 BASE) MCG/ACT inhaler Inhale 2 puffs into the lungs as needed for wheezing.      . budesonide-formoterol (SYMBICORT) 80-4.5 MCG/ACT inhaler Inhale 2 puffs into the lungs 2 (two) times daily.      . Cholecalciferol (VITAMIN D PO) Take 1 tablet by mouth daily.      . mirtazapine (REMERON) 30 MG tablet Take 30 mg by mouth at bedtime.      Marland Kitchen omeprazole (PRILOSEC) 20 MG capsule Take 20 mg by mouth daily.        . tamsulosin (FLOMAX) 0.4 MG CAPS capsule Take 0.4 mg by mouth.      . predniSONE (DELTASONE) 10 MG tablet Take 4 tablets daily for 5 days then stop  20 tablet  0  . [DISCONTINUED] traMADol (ULTRAM) 50 MG tablet Take 1 tablet (50 mg total) by mouth every 6 (six) hours as needed for pain.  15 tablet  0   No facility-administered encounter medications on file as of 05/07/2013.

## 2013-06-28 ENCOUNTER — Emergency Department (HOSPITAL_COMMUNITY): Payer: Medicare Other

## 2013-06-28 ENCOUNTER — Encounter (HOSPITAL_COMMUNITY): Payer: Self-pay | Admitting: Emergency Medicine

## 2013-06-28 ENCOUNTER — Observation Stay (HOSPITAL_COMMUNITY)
Admission: EM | Admit: 2013-06-28 | Discharge: 2013-06-30 | Disposition: A | Discharge status: Home / Self Care | Payer: Medicare Other | Attending: Internal Medicine | Admitting: Internal Medicine

## 2013-06-28 DIAGNOSIS — K5790 Diverticulosis of intestine, part unspecified, without perforation or abscess without bleeding: Secondary | ICD-10-CM

## 2013-06-28 DIAGNOSIS — Z860101 Personal history of adenomatous and serrated colon polyps: Secondary | ICD-10-CM

## 2013-06-28 DIAGNOSIS — M19049 Primary osteoarthritis, unspecified hand: Secondary | ICD-10-CM | POA: Insufficient documentation

## 2013-06-28 DIAGNOSIS — I951 Orthostatic hypotension: Principal | ICD-10-CM

## 2013-06-28 DIAGNOSIS — F411 Generalized anxiety disorder: Secondary | ICD-10-CM | POA: Insufficient documentation

## 2013-06-28 DIAGNOSIS — M129 Arthropathy, unspecified: Secondary | ICD-10-CM | POA: Insufficient documentation

## 2013-06-28 DIAGNOSIS — C61 Malignant neoplasm of prostate: Secondary | ICD-10-CM

## 2013-06-28 DIAGNOSIS — Z8546 Personal history of malignant neoplasm of prostate: Secondary | ICD-10-CM | POA: Insufficient documentation

## 2013-06-28 DIAGNOSIS — Z8673 Personal history of transient ischemic attack (TIA), and cerebral infarction without residual deficits: Secondary | ICD-10-CM | POA: Insufficient documentation

## 2013-06-28 DIAGNOSIS — R42 Dizziness and giddiness: Secondary | ICD-10-CM

## 2013-06-28 DIAGNOSIS — Z87891 Personal history of nicotine dependence: Secondary | ICD-10-CM | POA: Insufficient documentation

## 2013-06-28 DIAGNOSIS — Z79899 Other long term (current) drug therapy: Secondary | ICD-10-CM | POA: Insufficient documentation

## 2013-06-28 DIAGNOSIS — D519 Vitamin B12 deficiency anemia, unspecified: Secondary | ICD-10-CM

## 2013-06-28 DIAGNOSIS — H8110 Benign paroxysmal vertigo, unspecified ear: Secondary | ICD-10-CM

## 2013-06-28 DIAGNOSIS — Z86010 Personal history of colonic polyps: Secondary | ICD-10-CM

## 2013-06-28 DIAGNOSIS — N281 Cyst of kidney, acquired: Secondary | ICD-10-CM

## 2013-06-28 DIAGNOSIS — K922 Gastrointestinal hemorrhage, unspecified: Secondary | ICD-10-CM | POA: Insufficient documentation

## 2013-06-28 DIAGNOSIS — R5381 Other malaise: Secondary | ICD-10-CM | POA: Insufficient documentation

## 2013-06-28 DIAGNOSIS — J439 Emphysema, unspecified: Secondary | ICD-10-CM

## 2013-06-28 DIAGNOSIS — IMO0002 Reserved for concepts with insufficient information to code with codable children: Secondary | ICD-10-CM | POA: Insufficient documentation

## 2013-06-28 DIAGNOSIS — J441 Chronic obstructive pulmonary disease with (acute) exacerbation: Secondary | ICD-10-CM | POA: Insufficient documentation

## 2013-06-28 DIAGNOSIS — K219 Gastro-esophageal reflux disease without esophagitis: Secondary | ICD-10-CM

## 2013-06-28 DIAGNOSIS — R51 Headache: Secondary | ICD-10-CM | POA: Insufficient documentation

## 2013-06-28 DIAGNOSIS — M109 Gout, unspecified: Secondary | ICD-10-CM

## 2013-06-28 DIAGNOSIS — F329 Major depressive disorder, single episode, unspecified: Secondary | ICD-10-CM

## 2013-06-28 DIAGNOSIS — Z8701 Personal history of pneumonia (recurrent): Secondary | ICD-10-CM | POA: Insufficient documentation

## 2013-06-28 DIAGNOSIS — I671 Cerebral aneurysm, nonruptured: Secondary | ICD-10-CM

## 2013-06-28 DIAGNOSIS — F3289 Other specified depressive episodes: Secondary | ICD-10-CM

## 2013-06-28 DIAGNOSIS — E860 Dehydration: Secondary | ICD-10-CM

## 2013-06-28 LAB — HEPATIC FUNCTION PANEL
ALT: 14 U/L (ref 0–53)
AST: 23 U/L (ref 0–37)
Albumin: 3.6 g/dL (ref 3.5–5.2)
Bilirubin, Direct: <0.1 mg/dL (ref 0.0–0.3)
Total Bilirubin: 0.3 mg/dL (ref 0.3–1.2)

## 2013-06-28 LAB — CBC
HCT: 37.7 % — ABNORMAL LOW (ref 39.0–52.0)
Hemoglobin: 12 g/dL — ABNORMAL LOW (ref 13.0–17.0)
MCH: 26.1 pg (ref 26.0–34.0)
MCHC: 31.8 g/dL (ref 30.0–36.0)
MCV: 82.9 fL (ref 78.0–100.0)
Platelets: 142 10*3/uL — ABNORMAL LOW (ref 150–400)
RDW: 14.7 % (ref 11.5–15.5)

## 2013-06-28 LAB — URINALYSIS, ROUTINE W REFLEX MICROSCOPIC
Bilirubin Urine: NEGATIVE
Ketones, ur: NEGATIVE mg/dL
Nitrite: NEGATIVE
Protein, ur: NEGATIVE mg/dL
Urobilinogen, UA: 0.2 mg/dL (ref 0.0–1.0)

## 2013-06-28 LAB — BASIC METABOLIC PANEL
BUN: 20 mg/dL (ref 6–23)
CO2: 24 mEq/L (ref 19–32)
Calcium: 9.1 mg/dL (ref 8.4–10.5)
GFR calc non Af Amer: 62 mL/min — ABNORMAL LOW (ref >90)
Glucose, Bld: 101 mg/dL — ABNORMAL HIGH (ref 70–99)
Sodium: 140 mEq/L (ref 135–145)

## 2013-06-28 LAB — CREATININE, SERUM
GFR calc Af Amer: 77 mL/min — ABNORMAL LOW (ref >90)
GFR calc non Af Amer: 66 mL/min — ABNORMAL LOW (ref >90)

## 2013-06-28 MED ORDER — TAMSULOSIN HCL 0.4 MG PO CAPS
0.4000 mg | ORAL_CAPSULE | Freq: Every day | ORAL | Status: DC
  Administered 2013-06-28 – 2013-06-30 (×3): 0.4 mg via ORAL
  Filled 2013-06-28 (×3): qty 1

## 2013-06-28 MED ORDER — SODIUM CHLORIDE 0.9 % IV BOLUS (SEPSIS)
1000.0000 mL | Freq: Once | INTRAVENOUS | Status: AC
  Administered 2013-06-28: 500 mL via INTRAVENOUS

## 2013-06-28 MED ORDER — ACETAMINOPHEN 650 MG RE SUPP: 650.0000 mg | Freq: Four times a day (QID) | RECTAL | Status: DC | PRN

## 2013-06-28 MED ORDER — BUDESONIDE-FORMOTEROL FUMARATE 80-4.5 MCG/ACT IN AERO
2.0000 | INHALATION_SPRAY | Freq: Two times a day (BID) | RESPIRATORY_TRACT | Status: DC
  Administered 2013-06-29 – 2013-06-30 (×3): 2 via RESPIRATORY_TRACT
  Filled 2013-06-28: qty 6.9

## 2013-06-28 MED ORDER — ONDANSETRON HCL 4 MG PO TABS: 4.0000 mg | ORAL_TABLET | Freq: Four times a day (QID) | ORAL | Status: DC | PRN

## 2013-06-28 MED ORDER — SODIUM CHLORIDE 0.9 % IV SOLN: INTRAVENOUS | Status: AC

## 2013-06-28 MED ORDER — PANTOPRAZOLE SODIUM 40 MG PO TBEC
40.0000 mg | DELAYED_RELEASE_TABLET | Freq: Every day | ORAL | Status: DC
  Administered 2013-06-28 – 2013-06-30 (×3): 40 mg via ORAL
  Filled 2013-06-28 (×3): qty 1

## 2013-06-28 MED ORDER — ONDANSETRON HCL 4 MG/2ML IJ SOLN: 4.0000 mg | Freq: Four times a day (QID) | INTRAMUSCULAR | Status: DC | PRN

## 2013-06-28 MED ORDER — ENOXAPARIN SODIUM 40 MG/0.4ML ~~LOC~~ SOLN
40.0000 mg | SUBCUTANEOUS | Status: DC
  Administered 2013-06-28 – 2013-06-29 (×2): 40 mg via SUBCUTANEOUS
  Filled 2013-06-28 (×3): qty 0.4

## 2013-06-28 MED ORDER — ALBUTEROL SULFATE HFA 108 (90 BASE) MCG/ACT IN AERS
2.0000 | INHALATION_SPRAY | RESPIRATORY_TRACT | Status: DC | PRN
  Filled 2013-06-28: qty 6.7

## 2013-06-28 MED ORDER — ACETAMINOPHEN 325 MG PO TABS
650.0000 mg | ORAL_TABLET | Freq: Four times a day (QID) | ORAL | Status: DC | PRN
  Filled 2013-06-28: qty 2

## 2013-06-28 MED ORDER — MIRTAZAPINE 30 MG PO TABS
30.0000 mg | ORAL_TABLET | Freq: Every day | ORAL | Status: DC
  Administered 2013-06-28 – 2013-06-29 (×2): 30 mg via ORAL
  Filled 2013-06-28 (×3): qty 1

## 2013-06-28 MED ORDER — SODIUM CHLORIDE 0.9 % IV BOLUS (SEPSIS)
500.0000 mL | Freq: Once | INTRAVENOUS | Status: AC
  Administered 2013-06-28: 500 mL via INTRAVENOUS

## 2013-06-28 MED ORDER — ONDANSETRON HCL 4 MG/2ML IJ SOLN: 4.0000 mg | Freq: Three times a day (TID) | INTRAMUSCULAR | Status: DC | PRN

## 2013-06-28 MED ORDER — ACETAMINOPHEN 325 MG PO TABS
650.0000 mg | ORAL_TABLET | Freq: Four times a day (QID) | ORAL | Status: DC | PRN
  Administered 2013-06-29: 650 mg via ORAL

## 2013-06-28 NOTE — ED Provider Notes (Signed)
CSN: [id number]     Arrival date & time 06/28/13  4098 History   None    Chief Complaint  Patient presents with  . Dizziness  . Weakness  . Headache   HPI 79 year old man with PMH COPD, HTN, prostate cancer who presents with lightheadedness and headache.    Patient states this morning he started to sit up in bed but felt lightheaded so had to lie back down.  He tried again with the same problem so called his son who then called EMS.   He has a cough at baseline (2/2 COPD) but no recent increase in severity, no sputum production, no increased shortness of breath.  He does not use O2 at home.  No fever, chest pain, abdominal pain, weakness, numbness/tingling.    Past Medical History  Diagnosis Date  . COPD (chronic obstructive pulmonary disease)   . GERD (gastroesophageal reflux disease)   . Anxiety and depression   . Gout   . CVA (cerebral infarction) 2012    Left parietal ICH; "left him weaker on the right side" (03/13/2013)  . GIB (gastrointestinal bleeding) 04/12  . Diverticulosis   . Family history of anesthesia complication     "makes son nauseous" (03/13/2013)  . Hypertension   . Pneumonia     "several times; last time 12-18 months ago"  (03/13/2013)  . Exertional shortness of breath   . Arthritis     "neck; hands" (03/13/2013)  . Prostate cancer    Past Surgical History  Procedure Laterality Date  . Prostate surgery    . Total knee arthroplasty Right   . Shoulder surgery Right 2003    decompression; "fell and broke it years ago; doesn't have plates or screws in there;" (03/13/2013)  . Carotid stent insertion Bilateral    Family History  Problem Relation Age of Onset  . Cancer Father   . Diabetes Sister   . Heart disease Sister   . Hyperlipidemia Sister   . Hypertension Sister    History  Substance Use Topics  . Smoking status: Former Smoker -- 1.50 packs/day for 54 years    Types: Cigarettes    Quit date: 07/26/1993  . Smokeless tobacco: Never Used  . Alcohol  Use: Yes     Comment: 03/13/2013 "hadn't had a beer in 4-5 years; never had problem w/alcohol"    Review of Systems  Constitutional: Negative for fever.  HENT: Negative for congestion and sore throat.   Respiratory: Positive for cough. Negative for shortness of breath and wheezing.   Cardiovascular: Negative for chest pain, palpitations and leg swelling.  Gastrointestinal: Negative for nausea, vomiting, abdominal pain, diarrhea, constipation and blood in stool.  Genitourinary: Negative for dysuria and frequency.  Musculoskeletal: Negative for gait problem and myalgias.  Neurological: Positive for headaches. Negative for dizziness, syncope, weakness and numbness.    Allergies  Contrast media  Home Medications   Current Outpatient Rx  Name  Route  Sig  Dispense  Refill  . budesonide-formoterol (SYMBICORT) 80-4.5 MCG/ACT inhaler   Inhalation   Inhale 2 puffs into the lungs 2 (two) times daily.         . Cholecalciferol (VITAMIN D PO)   Oral   Take 1 tablet by mouth daily.         . mirtazapine (REMERON) 30 MG tablet   Oral   Take 30 mg by mouth at bedtime.         Marland Kitchen omeprazole (PRILOSEC) 20 MG capsule  Oral   Take 20 mg by mouth daily.           . tamsulosin (FLOMAX) 0.4 MG CAPS capsule   Oral   Take 0.4 mg by mouth daily.          Marland Kitchen albuterol (PROVENTIL HFA;VENTOLIN HFA) 108 (90 BASE) MCG/ACT inhaler   Inhalation   Inhale 2 puffs into the lungs as needed for wheezing.          BP 122/61  Pulse 78  Temp(Src) 97.8 F (36.6 C) (Oral)  Resp 16  Ht 6' (1.829 m)  Wt 180 lb (81.647 kg)  BMI 24.41 kg/m2  SpO2 97% Physical Exam  Constitutional: He is oriented to person, place, and time. He appears well-developed and well-nourished.  HENT:  Head: Normocephalic and atraumatic.  Eyes: Conjunctivae and EOM are normal. Pupils are equal, round, and reactive to light.  Neck: Normal range of motion. Neck supple.  Cardiovascular: Normal rate and regular rhythm.   Exam reveals no gallop and no friction rub.   No murmur heard. Pulmonary/Chest: Effort normal and breath sounds normal. He has no wheezes. He has no rales.  Abdominal: Soft. Bowel sounds are normal. He exhibits no distension. There is no tenderness.  Neurological: He is alert and oriented to person, place, and time. No cranial nerve deficit.  Strength intact throughout.    ED Course  Procedures (including critical care time) Labs Review Labs Reviewed  CBC - Abnormal; Notable for the following:    Hemoglobin 12.4 (*)    HCT 38.6 (*)    All other components within normal limits  URINALYSIS, ROUTINE W REFLEX MICROSCOPIC  HEPATIC FUNCTION PANEL  TROPONIN I  BASIC METABOLIC PANEL   Imaging Review Dg Chest 2 View  06/28/2013   CLINICAL DATA:  Dizzy, headache  EXAM: CHEST  2 VIEW  COMPARISON:  03/11/2013  FINDINGS: Hyperinflation consistent with COPD. Heart size and vascular pattern normal. Central pulmonary arteries are prominent consistent with pulmonary arterial hypertension.  IMPRESSION: No acute findings.   Electronically Signed   By: Esperanza Heir M.D.   On: 06/28/2013 11:49    EKG Interpretation    Date/Time:  Thursday June 28 2013 10:14:59 EST Ventricular Rate:  59 PR Interval:  161 QRS Duration: 142 QT Interval:  462 QTC Calculation: 458 R Axis:   82 Text Interpretation:  Sinus rhythm Right bundle branch block No significant change was found Confirmed by Manus Gunning  MD, STEPHEN ([id number]) on 06/28/2013 10:26:27 AM            MDM  Dizziness- associated with dull frontal headache. Orthostatic vitals nearly positive for heart rate.  UA negative, CXR negative. BMP, LFTs within normal limits, troponin x 1 negative.  CT head with atrophy and chronic ischemia but no acute abnormality.  Low concern for recurrent CVA, most likely etiology of symptoms is orthostasis; gave 1.5 L bolus, NS at 75 cc/hr x 12 hours.  Patient unable to ambulate with nurse which is a change from his  baseline (usually takes daily walks) thus will admit to internal medicine for further assessment.   Rocco Serene, MD 06/28/13 1203   Rocco Serene, MD 06/28/13 1537

## 2013-06-28 NOTE — ED Notes (Signed)
Tolerated fluids well. Denies nausea

## 2013-06-28 NOTE — ED Notes (Signed)
Pt c/o dizziness especially with position changes, generalized weakness & mild h/a onset this morning

## 2013-06-28 NOTE — ED Notes (Signed)
Patient transported to X-ray / CT scan. 

## 2013-06-28 NOTE — ED Provider Notes (Signed)
I saw and evaluated the patient, reviewed the resident's note and I agree with the findings and plan. If applicable, I agree with the resident's interpretation of the EKG.  If applicable, I was present for critical portions of any procedures performed.  See my additional note  Glynn Octave, MD 06/28/13 [id number]

## 2013-06-28 NOTE — H&P (Signed)
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 As patient has history of stroke so the patient's son who lives with him , called the EMS. Patient did not have weakness of any extremity, no slurred speech. No seizure like activity. Denies chest pain, does have chronic shortness of breath due to history of COPD. Did have nausea this morning with no vomiting or diarrhea. Patient has been on Flomax for BPH, he denies fever no dysuria urgency or frequency of urination. In the ED patient was found to be orthostatics positive for heart rate. Patient is not on aspirin at home since August 2013, patient's son does not know the reason why he is not on aspirin. There is no history of gastric ulcers, though past medical history suggest GI bleeding in the past.   Allergies:   Allergies  Allergen Reactions  . Contrast Media [Iodinated Diagnostic Agents] Hives, Shortness Of Breath, Swelling and Rash      Past Medical History  Diagnosis Date  . COPD (chronic obstructive pulmonary disease)   . GERD (gastroesophageal reflux disease)   . Anxiety and depression   . Gout   . CVA (cerebral infarction) 2012    Left parietal ICH; "left him weaker on the right side" (03/13/2013)  . GIB (gastrointestinal bleeding) 04/12  . Diverticulosis   . Family history of anesthesia complication     "makes son nauseous" (03/13/2013)  . Hypertension   . Pneumonia     "several times; last time 12-18 months ago"  (03/13/2013)  . Exertional shortness of breath   . Arthritis     "neck; hands" (03/13/2013)  . Prostate cancer     Past Surgical History  Procedure Laterality Date  . Prostate surgery    . Total knee arthroplasty Right   . Shoulder  surgery Right 2003    decompression; "fell and broke it years ago; doesn't have plates or screws in there;" (03/13/2013)  . Carotid stent insertion Bilateral     Prior to Admission medications   Medication Sig Start Date End Date Taking? Authorizing Provider  budesonide-formoterol (SYMBICORT) 80-4.5 MCG/ACT inhaler Inhale 2 puffs into the lungs 2 (two) times daily.   Yes Historical Provider, MD  Cholecalciferol (VITAMIN D PO) Take 1 tablet by mouth daily.   Yes Historical Provider, MD  mirtazapine (REMERON) 30 MG tablet Take 30 mg by mouth at bedtime.   Yes Historical Provider, MD  omeprazole (PRILOSEC) 20 MG capsule Take 20 mg by mouth daily.     Yes Historical Provider, MD  tamsulosin (FLOMAX) 0.4 MG CAPS capsule Take 0.4 mg by mouth daily.    Yes Historical Provider, MD  albuterol (PROVENTIL HFA;VENTOLIN HFA) 108 (90 BASE) MCG/ACT inhaler Inhale 2 puffs into the lungs as needed for wheezing.    Historical Provider, MD    Social History:  reports that he quit smoking about 19 years ago. His smoking use included Cigarettes. He has a 81 pack-year smoking history. He has never used smokeless tobacco. He reports that he drinks alcohol. He reports that he does not use illicit drugs.  Family History  Problem Relation Age of Onset  . Cancer Father   . Diabetes Sister   . Heart disease Sister   .  Hyperlipidemia Sister   . Hypertension Sister      All the positives are listed in BOLD  Review of Systems:  HEENT: Headache, blurred vision, runny nose, sore throat Neck: Hypothyroidism, hyperthyroidism,,lymphadenopathy Chest : Shortness of breath, history of COPD, Asthma Heart : Chest pain, history of coronary arterey disease GI:  Nausea, vomiting, diarrhea, constipation, GERD GU: Dysuria, urgency, frequency of urination, hematuria, prostate cancer Neuro: Stroke, seizures, syncope Psych: Depression, anxiety, hallucinations   Physical Exam: Blood pressure 133/61, pulse 51, temperature  97.8 F (36.6 C), temperature source Oral, resp. rate 14, height 6' (1.829 m), weight 81.647 kg (180 lb), SpO2 96.00%. Constitutional:   Patient is a well-developed and well-nourished male* in no acute distress and cooperative with exam. Head: Normocephalic and atraumatic Mouth: Mucus membranes moist Eyes: PERRL, EOMI, conjunctivae normal Neck: Supple, No Thyromegaly Cardiovascular: RRR, S1 normal, S2 normal Pulmonary/Chest: CTAB, no wheezes, rales, or rhonchi Abdominal: Soft. Non-tender, non-distended, bowel sounds are normal, no masses, organomegaly, or guarding present.  Neurological: A&O x3, Strenght is minimal reduced on both right upper and lower extremity,  cranial nerve II-XII are grossly intact, no focal motor deficit, sensory intact to light touch bilaterally.  Extremities : No Cyanosis, Clubbing or Edema   Labs on Admission:  Results for orders placed during the hospital encounter of 06/28/13 (from the past 48 hour(s))  CBC     Status: Abnormal   Collection Time    06/28/13 10:51 AM      Result Value Range   WBC 5.0  4.0 - 10.5 K/uL   RBC 4.75  4.22 - 5.81 MIL/uL   Hemoglobin 12.4 (*) 13.0 - 17.0 g/dL   HCT 40.9 (*) 81.1 - 91.4 %   MCV 81.3  78.0 - 100.0 fL   MCH 26.1  26.0 - 34.0 pg   MCHC 32.1  30.0 - 36.0 g/dL   RDW 78.2  95.6 - 21.3 %   Platelets 162  150 - 400 K/uL  HEPATIC FUNCTION PANEL     Status: None   Collection Time    06/28/13 10:51 AM      Result Value Range   Total Protein 7.1  6.0 - 8.3 g/dL   Albumin 3.6  3.5 - 5.2 g/dL   AST 23  0 - 37 U/L   ALT 14  0 - 53 U/L   Alkaline Phosphatase 64  39 - 117 U/L   Total Bilirubin 0.3  0.3 - 1.2 mg/dL   Bilirubin, Direct <0.8  0.0 - 0.3 mg/dL   Indirect Bilirubin NOT CALCULATED  0.3 - 0.9 mg/dL  BASIC METABOLIC PANEL     Status: Abnormal   Collection Time    06/28/13 10:51 AM      Result Value Range   Sodium 140  135 - 145 mEq/L   Potassium 4.6  3.5 - 5.1 mEq/L   Chloride 105  96 - 112 mEq/L   CO2 24  19  - 32 mEq/L   Glucose, Bld 101 (*) 70 - 99 mg/dL   BUN 20  6 - 23 mg/dL   Creatinine, Ser 6.57  0.50 - 1.35 mg/dL   Calcium 9.1  8.4 - 84.6 mg/dL   GFR calc non Af Amer 62 (*) >90 mL/min   GFR calc Af Amer 72 (*) >90 mL/min   Comment: (NOTE)     The eGFR has been calculated using the CKD EPI equation.     This calculation has not been validated in all clinical  situations.     eGFR's persistently <90 mL/min signify possible Chronic Kidney     Disease.  TROPONIN I     Status: None   Collection Time    06/28/13 10:52 AM      Result Value Range   Troponin I <0.30  <0.30 ng/mL   Comment:            Due to the release kinetics of cTnI,     a negative result within the first hours     of the onset of symptoms does not rule out     myocardial infarction with certainty.     If myocardial infarction is still suspected,     repeat the test at appropriate intervals.  URINALYSIS, ROUTINE W REFLEX MICROSCOPIC     Status: None   Collection Time    06/28/13 10:56 AM      Result Value Range   Color, Urine YELLOW  YELLOW   APPearance CLEAR  CLEAR   Specific Gravity, Urine 1.014  1.005 - 1.030   pH 7.5  5.0 - 8.0   Glucose, UA NEGATIVE  NEGATIVE mg/dL   Hgb urine dipstick NEGATIVE  NEGATIVE   Bilirubin Urine NEGATIVE  NEGATIVE   Ketones, ur NEGATIVE  NEGATIVE mg/dL   Protein, ur NEGATIVE  NEGATIVE mg/dL   Urobilinogen, UA 0.2  0.0 - 1.0 mg/dL   Nitrite NEGATIVE  NEGATIVE   Leukocytes, UA NEGATIVE  NEGATIVE   Comment: MICROSCOPIC NOT DONE ON URINES WITH NEGATIVE PROTEIN, BLOOD, LEUKOCYTES, NITRITE, OR GLUCOSE <1000 mg/dL.  TROPONIN I     Status: None   Collection Time    06/28/13 12:44 PM      Result Value Range   Troponin I <0.30  <0.30 ng/mL   Comment:            Due to the release kinetics of cTnI,     a negative result within the first hours     of the onset of symptoms does not rule out     myocardial infarction with certainty.     If myocardial infarction is still suspected,      repeat the test at appropriate intervals.    Radiological Exams on Admission: Dg Chest 2 View  06/28/2013   CLINICAL DATA:  Dizzy, headache  EXAM: CHEST  2 VIEW  COMPARISON:  03/11/2013  FINDINGS: Hyperinflation consistent with COPD. Heart size and vascular pattern normal. Central pulmonary arteries are prominent consistent with pulmonary arterial hypertension.  IMPRESSION: No acute findings.   Electronically Signed   By: Esperanza Heir M.D.   On: 06/28/2013 11:49   Ct Head Wo Contrast  06/28/2013   CLINICAL DATA:  Dizziness and weakness and headache  EXAM: CT HEAD WITHOUT CONTRAST  TECHNIQUE: Contiguous axial images were obtained from the base of the skull through the vertex without intravenous contrast.  COMPARISON:  CT 03/11/2013.  FINDINGS: Chronic infarct left parietal lobe with a small calcification, unchanged from the prior study. Chronic microvascular ischemic change in the white matter. There is generalized atrophy.  Negative for acute infarct, hemorrhage, or mass lesion.  Right cavernous carotid stent and aneurysm coiling unchanged from the prior study.  IMPRESSION: Atrophy and chronic ischemia.  No acute abnormality.  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   Electronically Signed   By: Marlan Palau M.D.   On: 06/28/2013 12:10    Assessment/Plan Active Problems:   Orthostasis   Dizziness  79 year old male with a history of  hemorrhagic CVA in the past, ophthalmic artery aneurysm status post coiling came to the hospital with dizziness. CT head is negative. Patient was found to be of orthostatic by heart rate, he has been on Flomax for BPH. Will admit the patient to telemetry, will obtain 3 sets of cardiac enzymes. Troponin is negative in the ED. EKG shows right bundle branch block. Will obtain orthostatics every shift. Patient will be started on IV normal saline at 75 mL per hour. Will continue the home medications Also obtain MRI to rule out occult CVA, as patient  clinically does not appear to be having any neurological deficit. Will not start aspirin at this time, as patient has been off aspirin for more than a year for unknown reasons. If MRI brain shows stroke,  We'll have to start baby aspirin.  Code status: patient is DO NOT RESUSCITATE   Family discussion: discussed with patient's son at bedside    Time Spent on Admission: 75 min  [facility] Pager: [phone] 06/28/2013, 3:12 PM  If 7PM-7AM, please contact night-coverage  [email or web address]  Password [email or web address]

## 2013-06-28 NOTE — Progress Notes (Signed)
I called to get report from ED nurse, nurse not able to give report at this time. I provided telephone number for call back.  Colleen Can, RN

## 2013-06-28 NOTE — ED Provider Notes (Signed)
Patient from home with generalized weakness, lightheadedness unable to get out of bed this morning. Associated with mild gradual onset headache. No chest pain or shortness of breath. No vomiting or fever. No focal neurological deficits. Orthostatics positive by heart rate. Patient given IV fluids. Renal function stable. Patient unable to ambulate which is a change from his baseline. He is normally active and takes walks everyday. He complains of feeling dizzy and having blurry vision upon the Get up. Do not suspect recurrent CVA suspect orthostasis. Patient states his PCP is in [facility] and is not Dr. Wylene Simmer.  Patient's son is concerned that MRI brain was not obtained earlier. I explained to the son the patient was last seen last night in his normal state. he is not a TPA candidate based on his delayed presentation as well as minimal symptoms.  Patient's last seen normal was last night so he is well beyond the window for thrombolytics.  This was discussed with patient and son.   Glynn Octave, MD 06/28/13 [phone]

## 2013-06-28 NOTE — ED Notes (Signed)
Denies any CP, palpitations, SOB, fever, chills, cold, cough, n/v/d. States when he tried to get out of bed this morning he felt really dizzy & was unable to get out of bed. States noticed very mild, dull h/a this morning across forehead.

## 2013-06-29 ENCOUNTER — Observation Stay (HOSPITAL_COMMUNITY): Payer: Medicare Other

## 2013-06-29 DIAGNOSIS — H8110 Benign paroxysmal vertigo, unspecified ear: Secondary | ICD-10-CM

## 2013-06-29 DIAGNOSIS — I951 Orthostatic hypotension: Secondary | ICD-10-CM

## 2013-06-29 LAB — TROPONIN I
Troponin I: <0.3 ng/mL (ref <0.30)
Troponin I: <0.3 ng/mL (ref <0.30)

## 2013-06-29 LAB — FOLATE: Folate: >20 ng/mL

## 2013-06-29 LAB — BASIC METABOLIC PANEL
BUN: 17 mg/dL (ref 6–23)
Chloride: 107 mEq/L (ref 96–112)
Creatinine, Ser: 1.04 mg/dL (ref 0.50–1.35)
GFR calc Af Amer: 76 mL/min — ABNORMAL LOW (ref >90)
GFR calc non Af Amer: 65 mL/min — ABNORMAL LOW (ref >90)
Potassium: 4.4 mEq/L (ref 3.5–5.1)

## 2013-06-29 LAB — CORTISOL: Cortisol, Plasma: 8 ug/dL

## 2013-06-29 LAB — VITAMIN B12: Vitamin B-12: 461 pg/mL (ref 211–911)

## 2013-06-29 MED ORDER — MECLIZINE HCL 12.5 MG PO TABS: 12.5000 mg | ORAL_TABLET | Freq: Three times a day (TID) | ORAL | Status: DC

## 2013-06-29 MED ORDER — MECLIZINE HCL 25 MG PO TABS: 25.0000 mg | ORAL_TABLET | Freq: Three times a day (TID) | ORAL | Status: DC

## 2013-06-29 MED ORDER — MECLIZINE HCL 25 MG PO TABS
25.0000 mg | ORAL_TABLET | Freq: Three times a day (TID) | ORAL | Status: DC
  Administered 2013-06-29 – 2013-06-30 (×3): 25 mg via ORAL
  Filled 2013-06-29 (×5): qty 1

## 2013-06-29 NOTE — Progress Notes (Signed)
Patient ID: [name]  male  JYN:[id number]    DOB: 02-09-1930    DOA: 06/28/2013  PCP: Gaspar Garbe, MD  Assessment/Plan: Active Problems:    Benign paroxysmal positional vertigo - MRI of the brain was negative for any cerebellar stroke - Upon further questioning with the patient, he reports that he has" room spinning feeling" - I started him on meclizine 25mg  TID x 48hrs, repeat evaluation for vestibular rehabilitation - Continue gentle hydration - Troponins negative - Patient denies any ear pain/ear discharge any sinus issues.  COPD: Currently stable, no wheezing  Hypertension: Stable   DVT Prophylaxis:  Code Status:  Disposition: Hopefully tomorrow if improving    Subjective: Still feels intermittent vertigo symptoms  Objective: Weight change:   Intake/Output Summary (Last 24 hours) at 06/29/13 1657 Last data filed at 06/29/13 1500  Gross per 24 hour  Intake    240 ml  Output   1550 ml  Net  -1310 ml   Blood pressure 113/66, pulse 70, temperature 97.7 F (36.5 C), temperature source Oral, resp. rate 18, height 6\' 1"  (1.854 m), weight 76.658 kg (169 lb), SpO2 96.00%.  Physical Exam: General: Alert and awake, oriented x3, not in any acute distress. CVS: S1-S2 clear, no murmur rubs or gallops Chest: clear to auscultation bilaterally, no wheezing, rales or rhonchi Abdomen: soft nontender, nondistended, normal bowel sounds  Extremities: no cyanosis, clubbing or edema noted bilaterally Neuro: Cranial nerves II-XII intact, no focal neurological deficits  Lab Results: Basic Metabolic Panel:  Recent Labs Lab 06/28/13 1051 06/28/13 1700 06/29/13 0810  NA 140  --  139  K 4.6  --  4.4  CL 105  --  107  CO2 24  --  21  GLUCOSE 101*  --  98  BUN 20  --  17  CREATININE 1.08 1.03 1.04  CALCIUM 9.1  --  8.6   Liver Function Tests:  Recent Labs Lab 06/28/13 1051  AST 23  ALT 14  ALKPHOS 64  BILITOT 0.3  PROT 7.1  ALBUMIN 3.6   No results found  for this basename: LIPASE, AMYLASE,  in the last 168 hours No results found for this basename: AMMONIA,  in the last 168 hours CBC:  Recent Labs Lab 06/28/13 1051 06/28/13 1700  WBC 5.0 5.3  HGB 12.4* 12.0*  HCT 38.6* 37.7*  MCV 81.3 82.9  PLT 162 142*   Cardiac Enzymes:  Recent Labs Lab 06/28/13 1700 06/28/13 2335 06/29/13 0415  TROPONINI <0.30 <0.30 <0.30   BNP: No components found with this basename: POCBNP,  CBG: No results found for this basename: GLUCAP,  in the last 168 hours   Micro Results: No results found for this or any previous visit (from the past 240 hour(s)).  Studies/Results: Dg Chest 2 View  06/28/2013   CLINICAL DATA:  Dizzy, headache  EXAM: CHEST  2 VIEW  COMPARISON:  03/11/2013  FINDINGS: Hyperinflation consistent with COPD. Heart size and vascular pattern normal. Central pulmonary arteries are prominent consistent with pulmonary arterial hypertension.  IMPRESSION: No acute findings.   Electronically Signed   By: Esperanza Heir M.D.   On: 06/28/2013 11:49   Ct Head Wo Contrast  06/28/2013   CLINICAL DATA:  Dizziness and weakness and headache  EXAM: CT HEAD WITHOUT CONTRAST  TECHNIQUE: Contiguous axial images were obtained from the base of the skull through the vertex without intravenous contrast.  COMPARISON:  CT 03/11/2013.  FINDINGS: Chronic infarct left parietal lobe with a small  calcification, unchanged from the prior study. Chronic microvascular ischemic change in the white matter. There is generalized atrophy.  Negative for acute infarct, hemorrhage, or mass lesion.  Right cavernous carotid stent and aneurysm coiling unchanged from the prior study.  IMPRESSION: Atrophy and chronic ischemia.  No acute abnormality.  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   Electronically Signed   By: Marlan Palau M.D.   On: 06/28/2013 12:10   Mr Brain Wo Contrast  06/29/2013   CLINICAL DATA:  Dizziness upon waking this morning. Difficulty getting  out of bed. History of prior hemorrhagic CVA with mild right-sided hemi paresis.  EXAM: MRI HEAD WITHOUT CONTRAST  TECHNIQUE: Multiplanar, multiecho pulse sequences of the brain and surrounding structures were obtained without intravenous contrast.  COMPARISON:  CT head without contrast 06/28/2013 and 03/11/2013. MRI brain 04/15/2011.  FINDINGS: A remote hemorrhagic infarct is present within the left parietal and posterior frontal lobe. There is ex vacuo dilation of the lateral ventricle and thinning of the posterior corpus callosum. A remote infarct is again seen within the right occipital lobe. No acute infarct, hemorrhage, or mass lesion is present. Diffuse periventricular and subcortical white matter changes are again seen bilaterally. Remote lacunar infarcts in dilated perivascular spaces are present within the basal ganglia bilaterally.  Flow is present in the major intracranial arteries. The patient is status post bilateral lens extractions. Mild mucosal thickening is present within the ethmoid air cells and maxillary sinuses bilaterally. The mastoid air cells are clear.  IMPRESSION: 1. No acute intracranial abnormality. 2. Remote hemorrhagic infarct of the left parietal and posterior frontal lobe demonstrates expected evolution. 3. Remote right occipital lobe infarct. 4. Stable atrophy and diffuse white matter disease.   Electronically Signed   By: Gennette Pac M.D.   On: 06/29/2013 10:15    Medications: Scheduled Meds: . budesonide-formoterol  2 puff Inhalation BID  . enoxaparin (LOVENOX) injection  40 mg Subcutaneous Q24H  . meclizine  25 mg Oral TID  . mirtazapine  30 mg Oral QHS  . pantoprazole  40 mg Oral Daily  . tamsulosin  0.4 mg Oral Daily      LOS: 1 day   RAI,RIPUDEEP M.D. [facility] 06/29/2013, 4:57 PM Pager: [phone]  If 7PM-7AM, please contact night-coverage [email or web address] Password [email or web address]

## 2013-06-29 NOTE — Progress Notes (Signed)
UR completed 

## 2013-06-29 NOTE — Progress Notes (Signed)
PT NOTE See progress note for full details of treatment.  MD:  PT - Assessment/Plan : Pt with positive right BPPV. Treated with canalith repositioning. Need to f/u with pt in the am and ensure that crystals are clear. Recommend HHPT with vestibular rehab if son can provide 24 hour care. If son cannot provide 24 hour care, may need short term NHP with vestibular rehab. Thanks. [facility] [phone] (pager)

## 2013-06-29 NOTE — Progress Notes (Addendum)
06/29/13 1600  PT Visit Information  Last PT Received On 06/29/13  Assistance Needed +2 (for canalith repositioning maneuver)  History of Present Illness Pt admit with orthostasis/dizziness.    PT Time Calculation  PT Start Time 1550  PT Stop Time 1628  PT Time Calculation (min) 38 min  Subjective Data  Subjective "I do feel the room spin sometimes. "  Precautions  Precautions Fall  Restrictions  Weight Bearing Restrictions No  Cognition  Arousal/Alertness Awake/alert  Behavior During Therapy WFL for tasks assessed/performed  Overall Cognitive Status Within Functional Limits for tasks assessed  Bed Mobility  Bed Mobility Right Sidelying to Sit;Left Sidelying to Sit;Supine to Sit;Sit to Supine  Rolling Right 4: Min assist  Right Sidelying to Sit 3: Mod assist;With rails;HOB elevated  Left Sidelying to Sit 3: Mod assist;With rails  Supine to Sit 3: Mod assist  Sitting - Scoot to Edge of Bed 4: Min assist  Details for Bed Mobility Assistance cues for technique.  Takes incr time as well. Pt with positive head thrust right suggestive of right hypofunction.  Tested positive for right BPPV as well therefore treated with right canalith repositioning manuever.  Pt reports feeling better after treatment.  Spent alot of time educating pt as to what BPPV is and why it causes dizziness and gave handouts.    Transfers  Transfers Sit to Stand;Stand to [employer] Transfers  Sit to Stand 3: Mod assist;With upper extremity assist;From bed  Stand to Sit 3: Mod assist;With upper extremity assist;With armrests;To chair/3-in-1  Stand Pivot Transfers 3: Mod assist  Details for Transfer Assistance Pt was in chair on arrival and assisted pt to bed.  Pt needed steadying assist getting chair to bed.  AFter treatment, pt performed the pivot transfer with far less unsteadiness.    Ambulation/Gait  Ambulation/Gait Assistance Not tested (comment)  Stairs No  Wheelchair Mobility  Wheelchair Mobility No   Static Standing Balance  Static Standing - Balance Support Bilateral upper extremity supported;During functional activity  Static Standing - Level of Assistance 4: Min assist  Static Standing - Comment/# of Minutes 2 min with bil HHA  PT - End of Session  Equipment Utilized During Treatment Gait belt  Activity Tolerance Patient limited by fatigue  Patient left in chair;with call bell/phone within reach  Nurse Communication Mobility status  PT - Assessment/Plan  :  Pt with positive right BPPV.  Treated with canalith repositioning.  Need to f/u with pt in the am and ensure that crystals are clear.  Recommend HHPT with vestibular rehab if son can provide 24 hour care.  If son cannot provide 24 hour care, may need short term  NHP with vestibular rehab.    PT Plan Current plan remains appropriate  PT Frequency Min 3X/week  Follow Up Recommendations Supervision/Assistance - 24 hour;Home health PT  PT equipment None recommended by PT  PT Goal Progression  Progress towards PT goals Progressing toward goals  PT General Charges  $$ ACUTE PT VISIT 1 Procedure  PT Treatments  $Self Care/Home Management 8-22  $Canalith Rep Proc 8-22 mins  $Physical Performance Test 8-22 mins  Vashawn Ekstein [place],PT Acute Rehabilitation [phone] (pager)  

## 2013-06-29 NOTE — Progress Notes (Signed)
Antivert to be started after vestibular testing completed, Dawn PT contacted earlier to notify of new vestibular testing MD ordered, she is here currently completing testing.

## 2013-06-29 NOTE — Evaluation (Signed)
Physical Therapy Evaluation Patient Details Name: [name] MRN: [id number] DOB: 1929-08-05 Today's Date: 06/29/2013 Time: 4098-1191 PT Time Calculation (min): 23 min  PT Assessment / Plan / Recommendation History of Present Illness  Pt admit with orthostasis/dizziness.    Clinical Impression  Pt admitted with above. Pt currently with functional limitations due to the deficits listed below (see PT Problem List). Pt will need 24 hour care and use RW initially. Would ultimately benefit from short term SNF but pt wants to go home.   Pt will benefit from skilled PT to increase their independence and safety with mobility to allow discharge to the venue listed below.     PT Assessment  Patient needs continued PT services    Follow Up Recommendations  Supervision/Assistance - 24 hour;Home health PT; ultimately needs short term SNF                Equipment Recommendations  None recommended by PT         Frequency Min 3X/week    Precautions / Restrictions Precautions Precautions: Fall Restrictions Weight Bearing Restrictions: No   Pertinent Vitals/Pain Orthostatic BPs  Supine 132/76, 71 bpm  Sitting 125/71, 77 bpm  Standing 125/66, 80 bpm  Standing after 3 min 139/71, 82 bpm  Pt reports dizziness with each position change.  No pain     Mobility  Bed Mobility Bed Mobility: Rolling Right;Right Sidelying to Sit;Sitting - Scoot to [place] of Bed Rolling Right: 4: Min assist Right Sidelying to Sit: 3: Mod assist;With rails;HOB elevated Sitting - Scoot to Edge of Bed: 4: Min assist Details for Bed Mobility Assistance: cues for technique.  Takes incr time as well.  Transfers Transfers: Sit to Stand;Stand to Sit;Stand Pivot Transfers Sit to Stand: 3: Mod assist;With upper extremity assist;From bed Stand to Sit: 3: Mod assist;With upper extremity assist;With armrests;To chair/3-in-1 Stand Pivot Transfers: 3: Mod assist Details for Transfer Assistance: Needed steadying assist  throughout as he has posterior lean in standing.  Grossly unsteady when standing.   Ambulation/Gait Ambulation/Gait Assistance: Not tested (comment) Stairs: No Wheelchair Mobility Wheelchair Mobility: No    Exercises General Exercises - Lower Extremity Ankle Circles/Pumps: AROM;Both;10 reps;Seated Quad Sets: AROM;Both;10 reps;Seated   PT Diagnosis: Generalized weakness  PT Problem List: Decreased activity tolerance;Decreased balance;Decreased mobility;Decreased knowledge of use of DME;Decreased safety awareness PT Treatment Interventions: DME instruction;Gait training;Functional mobility training;Therapeutic activities;Therapeutic exercise;Patient/family education;Balance training     PT Goals(Current goals can be found in the care plan section) Acute Rehab PT Goals Patient Stated Goal: to go home PT Goal Formulation: With patient Time For Goal Achievement: 07/06/13 Potential to Achieve Goals: Good  Visit Information  Last PT Received On: 06/29/13 Assistance Needed: +1 History of Present Illness: Pt admit with orthostasis/dizziness.         Prior Functioning  Home Living Family/patient expects to be discharged to:: Private residence Living Arrangements: Children;Other relatives Available Help at Discharge: Family;Available 24 hours/day (son/daughter in law with several kids live with pt/babysitte) Type of Home: House Home Access: Stairs to enter [employer] of Steps: 3-4 Entrance Stairs-Rails: Right Home Layout: One level Home Equipment: Walker - 4 wheels;Cane - single point;Shower seat Prior Function Level of Independence: Independent Communication Communication: No difficulties Dominant Hand: Right    Cognition  Cognition Arousal/Alertness: Awake/alert Behavior During Therapy: WFL for tasks assessed/performed Overall Cognitive Status: Within Functional Limits for tasks assessed    Extremity/Trunk Assessment Upper Extremity Assessment Upper Extremity  Assessment: Defer to OT evaluation Lower Extremity Assessment  Lower Extremity Assessment: Generalized weakness Cervical / Trunk Assessment Cervical / Trunk Assessment: Normal   Balance Balance Balance Assessed: Yes Static Standing Balance Static Standing - Balance Support: Bilateral upper extremity supported;During functional activity Static Standing - Level of Assistance: 3: Mod assist Static Standing - Comment/# of Minutes: 2 min with bil HHA  End of Session PT - End of Session Equipment Utilized During Treatment: Gait belt Activity Tolerance: Patient limited by fatigue Patient left: in chair;with call bell/phone within reach Nurse Communication: Mobility status  GP Functional Assessment Tool Used: clinical judgment Functional Limitation: Mobility: Walking and moving around Mobility: Walking and Moving Around Current Status [phone]): At least 40 percent but less than 60 percent impaired, limited or restricted Mobility: Walking and Moving Around Goal Status [phone]): At least 1 percent but less than 20 percent impaired, limited or restricted   INGOLD,Kaylene  06/29/2013, 2:58 PM [facility] [phone] (pager)

## 2013-06-30 DIAGNOSIS — E860 Dehydration: Secondary | ICD-10-CM

## 2013-06-30 MED ORDER — DUTASTERIDE 0.5 MG PO CAPS: 0.5000 mg | ORAL_CAPSULE | Freq: Every day | ORAL | Status: DC

## 2013-06-30 MED ORDER — MECLIZINE HCL 25 MG PO TABS: 25.0000 mg | ORAL_TABLET | Freq: Three times a day (TID) | ORAL | Status: DC | PRN

## 2013-06-30 MED ORDER — DUTASTERIDE 0.5 MG PO CAPS
0.5000 mg | ORAL_CAPSULE | Freq: Every day | ORAL | Status: DC
  Filled 2013-06-30: qty 1

## 2013-06-30 MED ORDER — PROMETHAZINE HCL 12.5 MG PO TABS: 12.5000 mg | ORAL_TABLET | Freq: Four times a day (QID) | ORAL | Status: DC | PRN

## 2013-06-30 NOTE — Progress Notes (Signed)
Physical Therapy Treatment Patient Details Name: [name] MRN: [id number] DOB: 1930/01/04 Today's Date: 06/30/2013 Time: 0914-1009 [name] Time Calculation (min): 55 min  [name] Assessment / Plan / Recommendation  History of Present Illness [name] admit with orthostasis/dizziness.  Found by vestibular [name] to have R BPPV and R hypofunction.     [name] Comments   [name] is progressing well with his therapy today.  He is requiring much less assist with mobility and gait, although still very unsteady on his feet without assistive device and assist.  I re-tested his right ear for BPPV and he has (-) nystagmus today, but still mildly (+) symptoms, so I preformed the cantilith repositioning maneuver again.  I spoke at length with [name], son, and MD re: my recommendations for HHPT (vestibular rehab), RW use 24/7 and transition to PACE program so that [name] can remain active socially and physically after he completes HHPT (per son he stops exercising once those services stop).   Follow Up Recommendations  Home health [name];Supervision for mobility/OOB (use RW at home 24/7, and HHPT VESTIBULAR specialist)     Does the patient have the potential to tolerate intense rehabilitation    NA  Barriers to Discharge   None      Equipment Recommendations  None recommended by [name]    Recommendations for Other Services   None  Frequency Min 3X/week   Progress towards [name] Goals Progress towards [name] goals: Progressing toward goals  Plan Current plan remains appropriate    Precautions / Restrictions Precautions Precautions: Fall   Pertinent Vitals/Pain Orthostatic BP: Supine  NT  Sitting  114/70  Standing 93/57      Mobility  Bed Mobility Bed Mobility: Supine to Sit;Sitting - Scoot to Edge of Bed;Sit to Supine Supine to Sit: 4: Min guard;With rails;HOB elevated Sitting - Scoot to Edge of Bed: 5: Supervision;With rail Sit to Supine: 4: Min guard;With rail;HOB flat Details for Bed Mobility Assistance: [name] needed min guard assist to  support trunk during transitions for balance.    Transfers Transfers: Sit to Stand;Stand to Sit Sit to Stand: 4: Min guard;With upper extremity assist;With armrests;From bed Stand to Sit: 4: Min guard;With upper extremity assist;With armrests;To bed;To chair/3-in-1 Details for Transfer Assistance: min guard assist for steadying trunk.  [name] initially with mild posterior lean and relying on legs against bed/chair for stability.   Ambulation/Gait Ambulation/Gait Assistance: 4: Min guard Ambulation Distance (Feet): 100 Feet Assistive device: 1 person hand held assist Ambulation/Gait Assistance Details: min guard assit to steady [name] for balance.   Gait Pattern: Step-through pattern (staggering) Gait velocity: decreased    Exercises Other Exercises Other Exercises: R BPPV re-assessed today.  No signs of nystagmus, but [name] still slightly symptomatic, so R cantilith repositioning maneuver preformed again.  [name]'s BP is soft in both sitting and standing which may also be contributing to his feeling of lighthededness in standing and with gait, although, not orthostatic.   Other Exercises: Encouraged [name] to continue to be active after HHPT wraps up at home. Per son he spends ~13 hours in a recliner watching TV.   Other Exercises: Spoke at length with son re: resources for senior exercise and activity programs.  Recommended he look into the PACE program (informed MD as well) in [place] as an option for him to socailize and be active.      [name] Goals (current goals can now be found in the care plan section) Acute Rehab [name] Goals Patient Stated Goal: to  not go home before he is ready.  "Idon't want to be a burden on my family"  Visit Information  Last [name] Received On: 06/30/13 Assistance Needed: +1 History of Present Illness: [name] admit with orthostasis/dizziness.  Found by vestibular [name] to have R BPPV and R hypofunction.      Subjective Data  Subjective: [name] reports he feels better, but  still has a mild sensation of dizziness/lightheadedness/nausea.  Patient Stated Goal: to not go home before he is ready.  "Idon't want to be a burden on my family"   Cognition  Cognition Arousal/Alertness: Awake/alert Behavior During Therapy: WFL for tasks assessed/performed Overall Cognitive Status: History of cognitive impairments - at baseline Memory: Decreased short-term memory    Balance  Static Standing Balance Static Standing - Balance Support: Right upper extremity supported Static Standing - Level of Assistance: 4: Min assist  End of Session [name] - End of Session Equipment Utilized During Treatment: Gait belt Activity Tolerance: Patient tolerated treatment well Patient left: in chair;with call bell/phone within reach Nurse Communication: Mobility status   GP Functional Assessment Tool Used: mobility status Functional Limitation: Mobility: Walking and moving around Mobility: Walking and Moving Around Current Status [phone]): At least 1 percent but less than 20 percent impaired, limited or restricted Mobility: Walking and Moving Around Goal Status [phone]): At least 1 percent but less than 20 percent impaired, limited or restricted Mobility: Walking and Moving Around Discharge Status [phone]): At least 1 percent but less than 20 percent impaired, limited or restricted   [name], [name], DPT [phone]   06/30/2013, 10:24 AM

## 2013-06-30 NOTE — Progress Notes (Signed)
   CARE MANAGEMENT NOTE 06/30/2013  Patient:  [name], [name]   Account Number:  [id number]  Date Initiated:  06/30/2013  Documentation initiated by:  [facility]  Subjective/Objective Assessment:   [id number] dizziness     Action/Plan:   discharge planning   Anticipated DC Date:  06/30/2013   Anticipated DC Plan:  HOME W HOME HEALTH SERVICES      DC Planning Services  CM consult      [facility] Choice  HOME HEALTH   Choice offered to / List presented to:  C-1 Patient        HH arranged  HH-1 RN  HH-2 PT  HH-3 OT      Status of service:  Completed, signed off Medicare Important Message given?   (If response is "NO", the following Medicare IM given date fields will be blank) Date Medicare IM given:   Date Additional Medicare IM given:    Discharge Disposition:  HOME W HOME HEALTH SERVICES  Per UR Regulation:    If discussed at Long Length of Stay Meetings, dates discussed:    Comments:  06/30/13 12:15 Cm met with pt in room to offer choice.  Pt chooses [facility] for HHPT/OT/RN.  24 hour supervision was recc and son is to provide 24 hour supervision.  Address and contact numbers were verified. Referral faxed to [facility] for HHPT/OT/RN.  No other CM needs were communicated.  Freddy Jaksch, BSN, CM [phone].

## 2013-06-30 NOTE — Discharge Summary (Signed)
Physician Discharge Summary  Patient ID: [name] MRN: [id number] DOB/AGE: 12/24/1929 79 y.o.  Admit date: 06/28/2013 Discharge date: 06/30/2013  Primary Care Physician:  Gaspar Garbe, MD  Final Discharge Diagnoses:   . Benign paroxysmal positional vertigo . Orthostasis . Dizziness . BPH  Consults: None  Recommendations for Outpatient Follow-up:  - Patient is recommended PACE program or similar adult daycare activities - Outpatient home PT, vestibular rehabilitation, R.N.   Allergies:   Allergies  Allergen Reactions  . Contrast Media [Iodinated Diagnostic Agents] Hives, Shortness Of Breath, Swelling and Rash     Discharge Medications:   Medication List    STOP taking these medications       tamsulosin 0.4 MG Caps capsule  Commonly known as:  FLOMAX      TAKE these medications       albuterol 108 (90 BASE) MCG/ACT inhaler  Commonly known as:  PROVENTIL HFA;VENTOLIN HFA  Inhale 2 puffs into the lungs as needed for wheezing.     budesonide-formoterol 80-4.5 MCG/ACT inhaler  Commonly known as:  SYMBICORT  Inhale 2 puffs into the lungs 2 (two) times daily.     dutasteride 0.5 MG capsule  Commonly known as:  AVODART  Take 1 capsule (0.5 mg total) by mouth daily.     meclizine 25 MG tablet  Commonly known as:  ANTIVERT  Take 1 tablet (25 mg total) by mouth 3 (three) times daily as needed for dizziness (also available over the counter.).     mirtazapine 30 MG tablet  Commonly known as:  REMERON  Take 30 mg by mouth at bedtime.     omeprazole 20 MG capsule  Commonly known as:  PRILOSEC  Take 20 mg by mouth daily.     VITAMIN D PO  Take 1 tablet by mouth daily.         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 As patient has history of stroke so the patient's son who lives with him , called the EMS. Patient did not have weakness of any extremity, no slurred speech. No seizure like activity. Denies chest pain, does have chronic shortness of breath due to history of COPD. Did have nausea with no vomiting or diarrhea.  Patient has been on Flomax for BPH, he denies fever no dysuria urgency or frequency of urination.  In the ED patient was found to be orthostatics positive for heart rate.   Hospital Course:   Benign paroxysmal positional vertigo: Patient was admitted for further workup. MRI of the brain was negative for any cerebellar stroke. Initially patient mentioning it is dizziness however upon further questioning with the patient, he reports that he has" room spinning feeling". Patient was started on meclizine 25mg  TID x 48hrs. Physical therapy was consulted for vestibular rehabilitation and patient had vertigo (right ear).  Patient has significant improvement in his symptoms after the physical therapy assistance for 2 days. Home PT, vestibular rehabilitation, on and followup were arranged. Patient was initially placed on gentle hydration for mild orthostasis. Troponins remained negative   Patient denies any ear pain/ear discharge any sinus issues.   COPD: Patient had no wheezing    BPH:  Discontinued Flomax due to side effects of orthostasis, placed on Avodart, follow up outpatient with history of adjust.   Day of Discharge BP 129/67  Pulse 82  Temp(Src) 97 F (36.1 C) (Oral)  Resp 18  Ht 6\' 1"  (1.854 m)  Wt 76.244 kg (168 lb 1.4 oz)  BMI 22.18 kg/m2  SpO2 95%  Physical Exam: General: Alert and awake oriented x3 not in any acute distress. HEENT: anicteric sclera, pupils reactive to light and accommodation CVS: S1-S2 clear no murmur rubs or gallops Chest: clear to auscultation bilaterally, no wheezing  rales or rhonchi Abdomen: soft nontender, nondistended, normal bowel sounds, no organomegaly Extremities: no cyanosis, clubbing or edema noted bilaterally Neuro: Cranial nerves II-XII intact, no focal neurological deficits   The results of significant diagnostics from this hospitalization (including imaging, microbiology, ancillary and laboratory) are listed below for reference.    LAB RESULTS: Basic Metabolic Panel:  Recent Labs Lab 06/28/13 1051 06/28/13 1700 06/29/13 0810  NA 140  --  139  K 4.6  --  4.4  CL 105  --  107  CO2 24  --  21  GLUCOSE 101*  --  98  BUN 20  --  17  CREATININE 1.08 1.03 1.04  CALCIUM 9.1  --  8.6   Liver Function Tests:  Recent Labs Lab 06/28/13 1051  AST 23  ALT 14  ALKPHOS 64  BILITOT 0.3  PROT 7.1  ALBUMIN 3.6   No results found for this basename: LIPASE, AMYLASE,  in the last 168 hours No results found for this basename: AMMONIA,  in the last 168 hours CBC:  Recent Labs Lab 06/28/13 1051 06/28/13 1700  WBC 5.0 5.3  HGB 12.4* 12.0*  HCT 38.6* 37.7*  MCV 81.3 82.9  PLT 162 142*   Cardiac Enzymes:  Recent Labs Lab 06/28/13 2335 06/29/13 0415  TROPONINI <0.30 <0.30   BNP: No components found with this basename: POCBNP,  CBG: No results found for this basename: GLUCAP,  in the last 168 hours  Significant Diagnostic Studies:  Dg Chest 2 View  06/28/2013   CLINICAL DATA:  Dizzy, headache  EXAM: CHEST  2 VIEW  COMPARISON:  03/11/2013  FINDINGS: Hyperinflation consistent with COPD. Heart size and vascular pattern normal. Central pulmonary arteries are prominent consistent with pulmonary arterial hypertension.  IMPRESSION: No acute findings.   Electronically Signed   By: Esperanza Heir M.D.   On: 06/28/2013 11:49   Ct Head Wo Contrast  06/28/2013   CLINICAL DATA:  Dizziness and weakness and headache  EXAM: CT HEAD WITHOUT CONTRAST  TECHNIQUE: Contiguous axial images were obtained from the base of the skull through the vertex  without intravenous contrast.  COMPARISON:  CT 03/11/2013.  FINDINGS: Chronic infarct left parietal lobe with a small calcification, unchanged from the prior study. Chronic microvascular ischemic change in the white matter. There is generalized atrophy.  Negative for acute infarct, hemorrhage, or mass lesion.  Right cavernous carotid stent and aneurysm coiling unchanged from the prior study.  IMPRESSION: Atrophy and chronic ischemia.  No acute abnormality.  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   Electronically Signed   By: Marlan Palau M.D.   On: 06/28/2013 12:10   Mr Brain Wo Contrast  06/29/2013   CLINICAL DATA:  Dizziness upon waking this morning. Difficulty getting out of bed. History of prior hemorrhagic CVA with mild right-sided hemi paresis.  EXAM: MRI HEAD WITHOUT CONTRAST  TECHNIQUE: Multiplanar, multiecho pulse sequences of the brain  and surrounding structures were obtained without intravenous contrast.  COMPARISON:  CT head without contrast 06/28/2013 and 03/11/2013. MRI brain 04/15/2011.  FINDINGS: A remote hemorrhagic infarct is present within the left parietal and posterior frontal lobe. There is ex vacuo dilation of the lateral ventricle and thinning of the posterior corpus callosum. A remote infarct is again seen within the right occipital lobe. No acute infarct, hemorrhage, or mass lesion is present. Diffuse periventricular and subcortical white matter changes are again seen bilaterally. Remote lacunar infarcts in dilated perivascular spaces are present within the basal ganglia bilaterally.  Flow is present in the major intracranial arteries. The patient is status post bilateral lens extractions. Mild mucosal thickening is present within the ethmoid air cells and maxillary sinuses bilaterally. The mastoid air cells are clear.  IMPRESSION: 1. No acute intracranial abnormality. 2. Remote hemorrhagic infarct of the left parietal and posterior frontal lobe demonstrates expected  evolution. 3. Remote right occipital lobe infarct. 4. Stable atrophy and diffuse white matter disease.   Electronically Signed   By: Gennette Pac M.D.   On: 06/29/2013 10:15       Disposition and Follow-up:     Discharge Orders   Future Orders Complete By Expires   Diet - low sodium heart healthy  As directed    Discharge instructions  As directed    Comments:     1) Please note the medication changes. Flomax has been stopped, he was started on new medication called Avodart for BPH  2) please continue meclizine 3 times a day for next 2 days, then as needed for vertigo   Increase activity slowly  As directed        DISPOSITION: Home  DIET:Heart healthy diet    DISCHARGE FOLLOW-UP Follow-up Information   Follow up with Gaspar Garbe, MD. Schedule an appointment as soon as possible for a visit in 10 days. (For hospital follow-up)    Specialty:  Internal Medicine   Contact information:   [address] [facility] [place] [address] [phone]       Time spent on Discharge: 35 minutes  Signed:   Mellonie Guess M.D. [facility] 06/30/2013, 11:45 AM Pager: [phone]

## 2013-06-30 NOTE — Progress Notes (Signed)
Utilization Review completed.  

## 2015-06-06 ENCOUNTER — Emergency Department (HOSPITAL_COMMUNITY): Payer: Medicare Other

## 2015-06-06 ENCOUNTER — Encounter (HOSPITAL_COMMUNITY): Payer: Self-pay | Admitting: Emergency Medicine

## 2015-06-06 ENCOUNTER — Emergency Department (HOSPITAL_COMMUNITY)
Admission: EM | Admit: 2015-06-06 | Discharge: 2015-06-06 | Disposition: A | Discharge status: Home / Self Care | Payer: Medicare Other | Attending: Emergency Medicine | Admitting: Emergency Medicine

## 2015-06-06 DIAGNOSIS — I10 Essential (primary) hypertension: Secondary | ICD-10-CM | POA: Insufficient documentation

## 2015-06-06 DIAGNOSIS — R1031 Right lower quadrant pain: Secondary | ICD-10-CM | POA: Insufficient documentation

## 2015-06-06 DIAGNOSIS — J449 Chronic obstructive pulmonary disease, unspecified: Secondary | ICD-10-CM | POA: Diagnosis not present

## 2015-06-06 DIAGNOSIS — Z8701 Personal history of pneumonia (recurrent): Secondary | ICD-10-CM | POA: Diagnosis not present

## 2015-06-06 DIAGNOSIS — Z8546 Personal history of malignant neoplasm of prostate: Secondary | ICD-10-CM | POA: Insufficient documentation

## 2015-06-06 DIAGNOSIS — Z8739 Personal history of other diseases of the musculoskeletal system and connective tissue: Secondary | ICD-10-CM | POA: Insufficient documentation

## 2015-06-06 DIAGNOSIS — K219 Gastro-esophageal reflux disease without esophagitis: Secondary | ICD-10-CM | POA: Diagnosis not present

## 2015-06-06 DIAGNOSIS — Z79899 Other long term (current) drug therapy: Secondary | ICD-10-CM | POA: Insufficient documentation

## 2015-06-06 DIAGNOSIS — R1030 Lower abdominal pain, unspecified: Secondary | ICD-10-CM

## 2015-06-06 DIAGNOSIS — Z8673 Personal history of transient ischemic attack (TIA), and cerebral infarction without residual deficits: Secondary | ICD-10-CM | POA: Diagnosis not present

## 2015-06-06 DIAGNOSIS — Z87891 Personal history of nicotine dependence: Secondary | ICD-10-CM | POA: Insufficient documentation

## 2015-06-06 LAB — COMPREHENSIVE METABOLIC PANEL
ALT: 19 U/L (ref 17–63)
AST: 30 U/L (ref 15–41)
Albumin: 3.8 g/dL (ref 3.5–5.0)
Alkaline Phosphatase: 65 U/L (ref 38–126)
Anion gap: 10 (ref 5–15)
BUN: 22 mg/dL — AB (ref 6–20)
CHLORIDE: 103 mmol/L (ref 101–111)
CO2: 26 mmol/L (ref 22–32)
Calcium: 9.3 mg/dL (ref 8.9–10.3)
Creatinine, Ser: 1.08 mg/dL (ref 0.61–1.24)
GFR calc Af Amer: >60 mL/min (ref >60)
GFR calc non Af Amer: >60 mL/min (ref >60)
Glucose, Bld: 128 mg/dL — ABNORMAL HIGH (ref 65–99)
Potassium: 4.7 mmol/L (ref 3.5–5.1)
SODIUM: 139 mmol/L (ref 135–145)
Total Bilirubin: 0.4 mg/dL (ref 0.3–1.2)
Total Protein: 7.5 g/dL (ref 6.5–8.1)

## 2015-06-06 LAB — CBC WITH DIFFERENTIAL/PLATELET
BASOS ABS: 0 10*3/uL (ref 0.0–0.1)
BASOS PCT: 0 %
EOS ABS: 0.4 10*3/uL (ref 0.0–0.7)
EOS PCT: 5 %
HCT: 41.2 % (ref 39.0–52.0)
Hemoglobin: 13.3 g/dL (ref 13.0–17.0)
LYMPHS ABS: 1.1 10*3/uL (ref 0.7–4.0)
LYMPHS PCT: 16 %
MCH: 27 pg (ref 26.0–34.0)
MCHC: 32.3 g/dL (ref 30.0–36.0)
MCV: 83.6 fL (ref 78.0–100.0)
MONO ABS: 0.6 10*3/uL (ref 0.1–1.0)
Monocytes Relative: 8 %
NEUTROS ABS: 5.1 10*3/uL (ref 1.7–7.7)
Neutrophils Relative %: 71 %
PLATELETS: 165 10*3/uL (ref 150–400)
RBC: 4.93 MIL/uL (ref 4.22–5.81)
RDW: 14.4 % (ref 11.5–15.5)
WBC: 7.1 10*3/uL (ref 4.0–10.5)

## 2015-06-06 LAB — I-STAT TROPONIN, ED: TROPONIN I, POC: 0.01 ng/mL (ref 0.00–0.08)

## 2015-06-06 LAB — URINALYSIS, ROUTINE W REFLEX MICROSCOPIC
Bilirubin Urine: NEGATIVE
Glucose, UA: NEGATIVE mg/dL
HGB URINE DIPSTICK: NEGATIVE
Ketones, ur: NEGATIVE mg/dL
Leukocytes, UA: NEGATIVE
NITRITE: NEGATIVE
Protein, ur: NEGATIVE mg/dL
Specific Gravity, Urine: 1.018 (ref 1.005–1.030)
UROBILINOGEN UA: 0.2 mg/dL (ref 0.0–1.0)
pH: 8.5 — ABNORMAL HIGH (ref 5.0–8.0)

## 2015-06-06 LAB — LIPASE, BLOOD: LIPASE: 28 U/L (ref 11–51)

## 2015-06-06 MED ORDER — ONDANSETRON 4 MG PO TBDP: ORAL_TABLET | ORAL | Status: DC

## 2015-06-06 MED ORDER — BARIUM SULFATE 2.1 % PO SUSP
ORAL | Status: AC
  Filled 2015-06-06: qty 2

## 2015-06-06 MED ORDER — SODIUM CHLORIDE 0.9 % IV BOLUS (SEPSIS)
1000.0000 mL | Freq: Once | INTRAVENOUS | Status: AC
  Administered 2015-06-06: 1000 mL via INTRAVENOUS

## 2015-06-06 MED ORDER — MORPHINE SULFATE (PF) 4 MG/ML IV SOLN
4.0000 mg | Freq: Once | INTRAVENOUS | Status: AC
Start: 2015-06-06 — End: 2015-06-06
  Administered 2015-06-06: 4 mg via INTRAVENOUS
  Filled 2015-06-06: qty 1

## 2015-06-06 MED ORDER — BARIUM SULFATE 2.1 % PO SUSP: 900.0000 mL | Freq: Once | ORAL | Status: DC

## 2015-06-06 NOTE — ED Notes (Signed)
Bladder scanned pt- showed 42mL

## 2015-06-06 NOTE — Discharge Instructions (Signed)
Take tylenol, motrin for pain.   Take zofran for nausea.  Stay hydrated.   See your doctor.   Return to ER if you have severe pain, vomiting, fevers, unable to urinate.

## 2015-06-06 NOTE — ED Notes (Signed)
Notified CT that pt has finished his contrast.   

## 2015-06-06 NOTE — ED Notes (Signed)
Pt here from home via [facility] for RLQ pain for several months.  Pt states frequent urination b/c "he's old", he thinks.  Denies diarrhea, emesis or bloody stool.  Hx of GI bleed and diverticulosis.

## 2015-06-06 NOTE — ED Provider Notes (Signed)
CSN: [id number]     Arrival date & time 06/06/15  0831 History   First MD Initiated Contact with Patient 06/06/15 [phone]     Chief Complaint  Patient presents with  . Abdominal Pain     (Consider location/radiation/quality/duration/timing/severity/associated sxs/prior Treatment) The history is provided by the patient.  [name] is a 81 y.o. male hx of COPD, GERD, CVA, HTN, dementia here presenting with frequent urination, abdominal pain. Patient told me that he has been having right lower quadrant pain for the last several months. He lives with his son, but as per his son, he was double over complaining of pain today only. Denies any nausea or vomiting or chest pain. Denies any diarrhea. Had CT abdomen pelvis several months ago that was unremarkable. Has been urinating more than usual but denies any blood in the urine.  Level V caveat- dementia    Past Medical History  Diagnosis Date  . COPD (chronic obstructive pulmonary disease) ([place])   . GERD (gastroesophageal reflux disease)   . Anxiety and depression   . Gout   . CVA (cerebral infarction) 2012    Left parietal ICH; "left him weaker on the right side" (03/13/2013)  . GIB (gastrointestinal bleeding) 04/12  . Diverticulosis   . Family history of anesthesia complication     "makes son nauseous" (03/13/2013)  . Hypertension   . Pneumonia     "several times; last time 12-18 months ago"  (03/13/2013)  . Exertional shortness of breath   . Arthritis     "neck; hands" (03/13/2013)  . Prostate cancer [facility])    Past Surgical History  Procedure Laterality Date  . Prostate surgery    . Total knee arthroplasty Right   . Shoulder surgery Right 2003    decompression; "fell and broke it years ago; doesn't have plates or screws in there;" (03/13/2013)  . Carotid stent insertion Bilateral    Family History  Problem Relation Age of Onset  . Cancer Father   . Diabetes Sister   . Heart disease Sister   . Hyperlipidemia Sister   .  Hypertension Sister    Social History  Substance Use Topics  . Smoking status: Former Smoker -- 1.50 packs/day for 54 years    Types: Cigarettes    Quit date: 07/26/1993  . Smokeless tobacco: Never Used  . Alcohol Use: Yes     Comment: 03/13/2013 "hadn't had a beer in 4-5 years; never had problem w/alcohol"    Review of Systems  Gastrointestinal: Positive for abdominal pain.  All other systems reviewed and are negative.     Allergies  Contrast media  Home Medications   Prior to Admission medications   Medication Sig Start Date End Date Taking? Authorizing Provider  albuterol (PROVENTIL HFA;VENTOLIN HFA) 108 (90 BASE) MCG/ACT inhaler Inhale 2 puffs into the lungs as needed for wheezing.   Yes Historical Provider, MD  budesonide-formoterol (SYMBICORT) 80-4.5 MCG/ACT inhaler Inhale 2 puffs into the lungs 2 (two) times daily.   Yes Historical Provider, MD  Cholecalciferol (VITAMIN D PO) Take 1,000 mg by mouth daily.    Yes Historical Provider, MD  finasteride (PROSCAR) 5 MG tablet Take 5 mg by mouth daily.   Yes Historical Provider, MD  mirtazapine (REMERON) 30 MG tablet Take 30 mg by mouth at bedtime.   Yes Historical Provider, MD  omeprazole (PRILOSEC) 20 MG capsule Take 20 mg by mouth daily.     Yes Historical Provider, MD   BP 134/70 mmHg  Pulse 55  Temp(Src) 98 F (36.7 C) (Oral)  Resp 18  Ht 6\' 1"  (1.854 m)  Wt 160 lb (72.576 kg)  BMI 21.11 kg/m2  SpO2 93% Physical Exam  Constitutional: He is oriented to person, place, and time.  Chronically ill, demented   HENT:  Head: Normocephalic.  Mouth/Throat: Oropharynx is clear and moist.  Eyes: Conjunctivae are normal. Pupils are equal, round, and reactive to light.  Neck: Normal range of motion. Neck supple.  Cardiovascular: Normal rate, regular rhythm and normal heart sounds.   Pulmonary/Chest: Effort normal and breath sounds normal. No respiratory distress. He has no wheezes. He has no rales.  Abdominal: Soft. Bowel  sounds are normal.  + RLQ tenderness, no rebound   Genitourinary:  Testicles nontender   Musculoskeletal: Normal range of motion.  Neurological: He is alert and oriented to person, place, and time. No cranial nerve deficit. Coordination normal.  Skin: Skin is warm and dry.  Psychiatric: He has a normal mood and affect. His behavior is normal. Thought content normal.  Nursing note and vitals reviewed.   ED Course  Procedures (including critical care time) Labs Review Labs Reviewed  COMPREHENSIVE METABOLIC PANEL - Abnormal; Notable for the following:    Glucose, Bld 128 (*)    BUN 22 (*)    All other components within normal limits  URINALYSIS, ROUTINE W REFLEX MICROSCOPIC (NOT AT [facility]) - Abnormal; Notable for the following:    APPearance CLOUDY (*)    pH 8.5 (*)    All other components within normal limits  CBC WITH DIFFERENTIAL/PLATELET  LIPASE, BLOOD  I-STAT TROPOININ, ED    Imaging Review Ct Abdomen Pelvis Wo Contrast  06/06/2015  CLINICAL DATA:  Several month history of right lower quadrant abdominal pain. EXAM: CT ABDOMEN AND PELVIS WITHOUT CONTRAST TECHNIQUE: Multidetector CT imaging of the abdomen and pelvis was performed following the standard protocol without IV contrast. COMPARISON:  03/31/2013 FINDINGS: Lower chest: The lung bases are clear of acute process. Minimal dependent atelectasis and scarring change. The heart is normal in size. No pericardial effusion. The distal esophagus is grossly normal. Hepatobiliary: Stable left hepatic lobe cyst. No worrisome hepatic lesions or intrahepatic biliary dilatation. The gallbladder appears normal. No common bile duct dilatation. Pancreas: Atrophy and fatty change involving the pancreas but no mass, inflammation or ductal dilatation. Spleen: Normal. Adrenals/Urinary Tract: The adrenal glands are normal. There are scattered renal calcifications but these appear to be vascular. No obstructing ureteral calculi or bladder calculi. Stable  lower pole right renal cyst. No worrisome renal lesions without contrast. No definite bladder lesions. Stomach/Bowel: The stomach, duodenum, small bowel and colon are grossly normal. No inflammatory changes, mass lesions or obstructive findings. Moderate colonic diverticulosis. Vascular/Lymphatic: Advanced atherosclerotic calcifications involving the aorta and iliac arteries and branch vessels. No focal aneurysm. No mesenteric or retroperitoneal mass or lymphadenopathy. Reproductive: The bladder, prostate gland and seminal vesicles are unremarkable. No pelvic mass or adenopathy. No free pelvic fluid collections. No inguinal mass or adenopathy. Other: No ascites, abdominal wall hernia or subcutaneous lesions. Musculoskeletal: No significant osseous findings. Moderate degenerative changes noted in the lower lumbar spine. IMPRESSION: No acute abdominal/ pelvic findings, mass lesions or adenopathy. Advanced atherosclerotic calcifications involving the aorta and branch vessels. Stable mild pancreatic atrophy and fatty change. Electronically Signed   By: Marijo Sanes M.D.   On: 06/06/2015 12:38   Dg Chest 2 View  06/06/2015  CLINICAL DATA:  Cough and shortness of Breath EXAM: CHEST - 2 VIEW  COMPARISON:  06/28/2013 FINDINGS: Cardiac shadow is stable. The lungs are well aerated bilaterally with mild hyperinflation consistent with COPD. Mild interstitial changes are again noted. There is a vague somewhat nodular density identified in the left upper lobe which in retrospect was present on the prior exam. This corresponds with some scarring seen on a prior CT examination from 20/10. No acute abnormality is noted. IMPRESSION: No active disease. Electronically Signed   By: Inez Catalina M.D.   On: 06/06/2015 09:30   I have personally reviewed and evaluated these images and lab results as part of my medical decision-making.   EKG Interpretation   Date/Time:  Friday June 06 2015 08:43:48 EST Ventricular Rate:   59 PR Interval:  69 QRS Duration: 147 QT Interval:  474 QTC Calculation: 470 R Axis:   74 Text Interpretation:  Sinus rhythm Short PR interval Right bundle branch  block No significant change since last tracing Confirmed by Hayven Croy  MD, Sheppard Luckenbach  ([id number]) on 06/06/2015 8:55:50 AM      MDM   Final diagnoses:  None   [name] is a 81 y.o. male here with RLQ pain, frequent urination. Unclear if it is acute or chronic as patient is demented at baseline. Will get CT ab/pel, UA, labs.   1:14 PM CT unremarkable. Labs at baseline. UA nl. Appears well. I don't know why he had abdominal pain. Maybe viral gastro. Will dc home with zofran, prn motrin, tylenol.    Wandra Arthurs, MD 06/06/15 [phone]

## 2015-07-08 ENCOUNTER — Emergency Department (HOSPITAL_COMMUNITY): Payer: Medicare Other

## 2015-07-08 ENCOUNTER — Emergency Department (HOSPITAL_COMMUNITY)
Admission: EM | Admit: 2015-07-08 | Discharge: 2015-07-09 | Disposition: A | Discharge status: Home / Self Care | Payer: Medicare Other | Attending: Emergency Medicine | Admitting: Emergency Medicine

## 2015-07-08 ENCOUNTER — Encounter (HOSPITAL_COMMUNITY): Payer: Self-pay | Admitting: Emergency Medicine

## 2015-07-08 DIAGNOSIS — K219 Gastro-esophageal reflux disease without esophagitis: Secondary | ICD-10-CM | POA: Diagnosis not present

## 2015-07-08 DIAGNOSIS — Z8701 Personal history of pneumonia (recurrent): Secondary | ICD-10-CM | POA: Diagnosis not present

## 2015-07-08 DIAGNOSIS — Z79899 Other long term (current) drug therapy: Secondary | ICD-10-CM | POA: Insufficient documentation

## 2015-07-08 DIAGNOSIS — Z8673 Personal history of transient ischemic attack (TIA), and cerebral infarction without residual deficits: Secondary | ICD-10-CM | POA: Diagnosis not present

## 2015-07-08 DIAGNOSIS — F0781 Postconcussional syndrome: Secondary | ICD-10-CM | POA: Diagnosis not present

## 2015-07-08 DIAGNOSIS — R634 Abnormal weight loss: Secondary | ICD-10-CM | POA: Insufficient documentation

## 2015-07-08 DIAGNOSIS — Z87891 Personal history of nicotine dependence: Secondary | ICD-10-CM | POA: Insufficient documentation

## 2015-07-08 DIAGNOSIS — Z8739 Personal history of other diseases of the musculoskeletal system and connective tissue: Secondary | ICD-10-CM | POA: Diagnosis not present

## 2015-07-08 DIAGNOSIS — Z7951 Long term (current) use of inhaled steroids: Secondary | ICD-10-CM | POA: Diagnosis not present

## 2015-07-08 DIAGNOSIS — Z9861 Coronary angioplasty status: Secondary | ICD-10-CM | POA: Insufficient documentation

## 2015-07-08 DIAGNOSIS — J441 Chronic obstructive pulmonary disease with (acute) exacerbation: Secondary | ICD-10-CM | POA: Insufficient documentation

## 2015-07-08 DIAGNOSIS — I10 Essential (primary) hypertension: Secondary | ICD-10-CM | POA: Insufficient documentation

## 2015-07-08 DIAGNOSIS — R4182 Altered mental status, unspecified: Secondary | ICD-10-CM | POA: Insufficient documentation

## 2015-07-08 DIAGNOSIS — Z8546 Personal history of malignant neoplasm of prostate: Secondary | ICD-10-CM | POA: Diagnosis not present

## 2015-07-08 HISTORY — DX: Cerebral infarction, unspecified: I63.9

## 2015-07-08 LAB — COMPREHENSIVE METABOLIC PANEL
ALT: 16 U/L — ABNORMAL LOW (ref 17–63)
AST: 39 U/L (ref 15–41)
Albumin: 3.5 g/dL (ref 3.5–5.0)
Alkaline Phosphatase: 60 U/L (ref 38–126)
Anion gap: 7 (ref 5–15)
BUN: 24 mg/dL — ABNORMAL HIGH (ref 6–20)
CO2: 26 mmol/L (ref 22–32)
Calcium: 9.1 mg/dL (ref 8.9–10.3)
Chloride: 106 mmol/L (ref 101–111)
Creatinine, Ser: 1.21 mg/dL (ref 0.61–1.24)
GFR calc Af Amer: >60 mL/min (ref >60)
GFR calc non Af Amer: 54 mL/min — ABNORMAL LOW (ref >60)
Glucose, Bld: 127 mg/dL — ABNORMAL HIGH (ref 65–99)
Potassium: 3.8 mmol/L (ref 3.5–5.1)
Sodium: 139 mmol/L (ref 135–145)
Total Bilirubin: 0.5 mg/dL (ref 0.3–1.2)
Total Protein: 7 g/dL (ref 6.5–8.1)

## 2015-07-08 LAB — CBC WITH DIFFERENTIAL/PLATELET
Basophils Absolute: 0 10*3/uL (ref 0.0–0.1)
Basophils Relative: 0 %
Eosinophils Absolute: 0.6 10*3/uL (ref 0.0–0.7)
Eosinophils Relative: 8 %
HCT: 37.8 % — ABNORMAL LOW (ref 39.0–52.0)
Hemoglobin: 12.1 g/dL — ABNORMAL LOW (ref 13.0–17.0)
Lymphocytes Relative: 21 %
Lymphs Abs: 1.5 10*3/uL (ref 0.7–4.0)
MCH: 27.3 pg (ref 26.0–34.0)
MCHC: 32 g/dL (ref 30.0–36.0)
MCV: 85.3 fL (ref 78.0–100.0)
Monocytes Absolute: 0.7 10*3/uL (ref 0.1–1.0)
Monocytes Relative: 10 %
Neutro Abs: 4.3 10*3/uL (ref 1.7–7.7)
Neutrophils Relative %: 61 %
Platelets: 154 10*3/uL (ref 150–400)
RBC: 4.43 MIL/uL (ref 4.22–5.81)
RDW: 14.8 % (ref 11.5–15.5)
WBC: 7 10*3/uL (ref 4.0–10.5)

## 2015-07-08 LAB — URINALYSIS, ROUTINE W REFLEX MICROSCOPIC
Bilirubin Urine: NEGATIVE
Glucose, UA: NEGATIVE mg/dL
Hgb urine dipstick: NEGATIVE
Ketones, ur: NEGATIVE mg/dL
Leukocytes, UA: NEGATIVE
Nitrite: NEGATIVE
Protein, ur: NEGATIVE mg/dL
Specific Gravity, Urine: 1.022 (ref 1.005–1.030)
pH: 6 (ref 5.0–8.0)

## 2015-07-08 NOTE — ED Notes (Signed)
Nurse explained delay and process to son after getting upset due to long wait .

## 2015-07-08 NOTE — ED Provider Notes (Signed)
CSN: [id number]     Arrival date & time 07/08/15  2153 History  By signing my name below, I, [name], attest that this documentation has been prepared under the direction and in the presence of [name], MD. Electronically Signed: [name], ED Scribe. 07/08/2015. 11:11 PM.  Chief Complaint  Patient presents with  . Altered Mental Status   Patient is a 81 y.o. male presenting with altered mental status. The history is provided by a relative. No language interpreter was used.  Altered Mental Status Presenting symptoms: confusion   Severity:  Moderate Most recent episode:  Today Episode history:  Continuous Duration:  2 days Progression:  Worsening Chronicity:  New Context: recent change in medication (Started Zoloft) and recent illness   Associated symptoms: nausea and visual change   Associated symptoms: no headaches and no weakness   HPI Comments: [name] is a 81 y.o. male with a hx of COPD, CVA, prostate cancer, and right sided stroke who presents to the Emergency Department complaining of altered mental status onset 2 days ago. Pt states that he has been having "dizzy spells," abnormal gait, blurred vision, and nausea. Son states that the pt was seen 4 days ago at [facility] for a bleed in the right temporal lobe; he reports that he had been seen a month before that and has not been "acting the same since." He reports that pt has been taking Zoloft for 6 days and believes that it has not been effective because he has been having confusion and weight loss ever since. Son reports that this includes the pt forgetting that he had already taken his medication and was trying to take it again. Son is concerned that the pt is either having another "brain bleed" or having complications from the last one. He denies SOB, headache, new numbness, or weakness. Son denies noticing any facial droop on the patient. Pt is on Symbicort x2 a day. Pt is allergic to contrast  media.  Past Medical History  Diagnosis Date  . COPD (chronic obstructive pulmonary disease) ([place])   . GERD (gastroesophageal reflux disease)   . Anxiety and depression   . Gout   . CVA (cerebral infarction) 2012    Left parietal ICH; "left him weaker on the right side" (03/13/2013)  . GIB (gastrointestinal bleeding) 04/12  . Diverticulosis   . Family history of anesthesia complication     "makes son nauseous" (03/13/2013)  . Hypertension   . Pneumonia     "several times; last time 12-18 months ago"  (03/13/2013)  . Exertional shortness of breath   . Arthritis     "neck; hands" (03/13/2013)  . Prostate cancer ([place])   . Stroke [facility])    Past Surgical History  Procedure Laterality Date  . Prostate surgery    . Total knee arthroplasty Right   . Shoulder surgery Right 2003    decompression; "fell and broke it years ago; doesn't have plates or screws in there;" (03/13/2013)  . Carotid stent insertion Bilateral    Family History  Problem Relation Age of Onset  . Cancer Father   . Diabetes Sister   . Heart disease Sister   . Hyperlipidemia Sister   . Hypertension Sister    Social History  Substance Use Topics  . Smoking status: Former Smoker -- 1.50 packs/day for 54 years    Types: Cigarettes    Quit date: 07/26/1993  . Smokeless tobacco: Never Used  . Alcohol Use: Yes  Review of Systems  Constitutional: Positive for unexpected weight change.  Eyes: Positive for visual disturbance (blurred vision).  Gastrointestinal: Positive for nausea.  Musculoskeletal: Positive for gait problem.  Neurological: Positive for dizziness. Negative for weakness, numbness and headaches.  Psychiatric/Behavioral: Positive for confusion.  All other systems reviewed and are negative.  Allergies  Contrast media  Home Medications   Prior to Admission medications   Medication Sig Start Date End Date Taking? Authorizing Provider  albuterol (PROVENTIL HFA;VENTOLIN HFA) 108 (90 BASE) MCG/ACT  inhaler Inhale 2 puffs into the lungs as needed for wheezing.    Historical Provider, MD  budesonide-formoterol (SYMBICORT) 80-4.5 MCG/ACT inhaler Inhale 2 puffs into the lungs 2 (two) times daily.    Historical Provider, MD  Cholecalciferol (VITAMIN D PO) Take 1,000 mg by mouth daily.     Historical Provider, MD  finasteride (PROSCAR) 5 MG tablet Take 5 mg by mouth daily.    Historical Provider, MD  mirtazapine (REMERON) 30 MG tablet Take 30 mg by mouth at bedtime.    Historical Provider, MD  omeprazole (PRILOSEC) 20 MG capsule Take 20 mg by mouth daily.      Historical Provider, MD  ondansetron (ZOFRAN ODT) 4 MG disintegrating tablet 4mg  ODT q6 hours prn nausea/vomit 06/06/15   Wandra Arthurs, MD   BP 141/77 mmHg  Pulse 96  Temp(Src) 98.2 F (36.8 C) (Oral)  Resp 18  SpO2 97% Physical Exam  Constitutional: He is oriented to person, place, and time. He appears well-developed and well-nourished.  HENT:  Head: Normocephalic and atraumatic.  Eyes: EOM are normal.  Neck: Normal range of motion.  Cardiovascular: Normal rate, regular rhythm, normal heart sounds and intact distal pulses.   Pulmonary/Chest: Effort normal. He has wheezes.  Mild expiratory wheezing  Abdominal: Soft. He exhibits no distension. There is no tenderness.  Musculoskeletal: Normal range of motion.  Neurological: He is alert and oriented to person, place, and time. He displays normal reflexes. No cranial nerve deficit. Coordination normal.  4/5 strength right UE and LE  Skin: Skin is warm and dry.  Psychiatric: He has a normal mood and affect. Judgment normal.  Nursing note and vitals reviewed.   ED Course  Procedures (including critical care time) DIAGNOSTIC STUDIES: Oxygen Saturation is 97% on RA, normal by my interpretation.    COORDINATION OF CARE: 11:21 PM-Discussed treatment plan which includes CT scan and labs with pt at bedside and pt agreed to plan.    Labs Review Labs Reviewed  CBC WITH  DIFFERENTIAL/PLATELET - Abnormal; Notable for the following:    Hemoglobin 12.1 (*)    HCT 37.8 (*)    All other components within normal limits  COMPREHENSIVE METABOLIC PANEL - Abnormal; Notable for the following:    Glucose, Bld 127 (*)    BUN 24 (*)    ALT 16 (*)    GFR calc non Af Amer 54 (*)    All other components within normal limits  URINALYSIS, ROUTINE W REFLEX MICROSCOPIC (NOT AT [facility])    Imaging Review No results found.   Ct Head Wo Contrast  07/09/2015  CLINICAL DATA:  Drowsiness, blurry vision, and lightheaded off and on for a few days. Patient was recently at [facility] where he had a right temporal brain bleed. EXAM: CT HEAD WITHOUT CONTRAST TECHNIQUE: Contiguous axial images were obtained from the base of the skull through the vertex without intravenous contrast. COMPARISON:  MRI brain 06/29/2013.  CT head 06/28/2013. FINDINGS: Diffuse cerebral atrophy. Ventricular dilatation  consistent with central atrophy. Low-attenuation changes in the deep white matter likely due to small vessel ischemic change. Encephalomalacia in the left parietal lobe and right occipital lobe consistent with old infarcts. Focal calcification in the right parietal lobe is likely dystrophic. No mass effect or midline shift. No abnormal extra-axial fluid collections. Gray-white matter junctions are distinct. Basal cisterns are not effaced. No evidence of acute intracranial hemorrhage. No depressed skull fractures. Retention cysts in the maxillary antra. Mastoid air cells are not opacified. Vascular calcifications. Metallic structure demonstrated in the right cavernous sinus region consistent with aneurysm coil. IMPRESSION: No acute intracranial abnormalities. Chronic atrophy and small vessel ischemic changes. Old left parietal and right occipital infarcts. Electronically Signed   By: Lucienne Capers M.D.   On: 07/09/2015 00:20  I have personally reviewed and evaluated these images and lab results as part of my  medical decision-making.   EKG Interpretation None      MDM   Final diagnoses:  Post concussion syndrome   81 year old male with what I suspect is a postconcussive syndrome. Repeat imaging today without signs of acute bleed or other abnormality. Workup additionally this unremarkable as well. It has been determined that no acute conditions requiring further emergency intervention are present at this time. The patient has been advised of the diagnosis and plan. I reviewed any labs and imaging including any potential incidental findings. We have discussed signs and symptoms that warrant return to the ED and they are listed in the discharge instructions.    I personally preformed the services scribed in my presence. The recorded information has been reviewed is accurate. [name], MD.    [name], MD 07/21/15 [phone]

## 2015-07-08 NOTE — ED Notes (Addendum)
Son reported pt.'s confusion onset 2 days ago with dizziness and nausea , son added that pt. had a " brain bleed" 4 days ago and was seen at [place] and oriented at arrival , speech clear with no facial asymmetry , equal strong grips with no arm drift.

## 2015-07-09 NOTE — ED Notes (Signed)
Patient at CT at this time. 

## 2015-09-22 ENCOUNTER — Ambulatory Visit: Payer: Medicare Other | Admitting: Neurology

## 2015-10-30 ENCOUNTER — Emergency Department (HOSPITAL_COMMUNITY): Payer: Medicare Other

## 2015-10-30 ENCOUNTER — Inpatient Hospital Stay (HOSPITAL_COMMUNITY)
Admission: EM | Admit: 2015-10-30 | Discharge: 2015-11-05 | DRG: 871 | Disposition: A | Discharge status: Home Health | Payer: Medicare Other | Attending: Family Medicine | Admitting: Family Medicine

## 2015-10-30 DIAGNOSIS — Z22322 Carrier or suspected carrier of Methicillin resistant Staphylococcus aureus: Secondary | ICD-10-CM

## 2015-10-30 DIAGNOSIS — J449 Chronic obstructive pulmonary disease, unspecified: Secondary | ICD-10-CM | POA: Diagnosis present

## 2015-10-30 DIAGNOSIS — R001 Bradycardia, unspecified: Secondary | ICD-10-CM | POA: Diagnosis present

## 2015-10-30 DIAGNOSIS — E872 Acidosis: Secondary | ICD-10-CM | POA: Diagnosis present

## 2015-10-30 DIAGNOSIS — A419 Sepsis, unspecified organism: Principal | ICD-10-CM | POA: Diagnosis present

## 2015-10-30 DIAGNOSIS — B359 Dermatophytosis, unspecified: Secondary | ICD-10-CM | POA: Diagnosis present

## 2015-10-30 DIAGNOSIS — J690 Pneumonitis due to inhalation of food and vomit: Secondary | ICD-10-CM | POA: Insufficient documentation

## 2015-10-30 DIAGNOSIS — Z87891 Personal history of nicotine dependence: Secondary | ICD-10-CM

## 2015-10-30 DIAGNOSIS — G9341 Metabolic encephalopathy: Secondary | ICD-10-CM | POA: Diagnosis present

## 2015-10-30 DIAGNOSIS — Z79899 Other long term (current) drug therapy: Secondary | ICD-10-CM

## 2015-10-30 DIAGNOSIS — J189 Pneumonia, unspecified organism: Secondary | ICD-10-CM | POA: Diagnosis not present

## 2015-10-30 DIAGNOSIS — J432 Centrilobular emphysema: Secondary | ICD-10-CM | POA: Diagnosis not present

## 2015-10-30 DIAGNOSIS — Z91041 Radiographic dye allergy status: Secondary | ICD-10-CM

## 2015-10-30 DIAGNOSIS — Z96651 Presence of right artificial knee joint: Secondary | ICD-10-CM | POA: Diagnosis present

## 2015-10-30 DIAGNOSIS — M109 Gout, unspecified: Secondary | ICD-10-CM | POA: Diagnosis present

## 2015-10-30 DIAGNOSIS — Z8673 Personal history of transient ischemic attack (TIA), and cerebral infarction without residual deficits: Secondary | ICD-10-CM

## 2015-10-30 DIAGNOSIS — Z8546 Personal history of malignant neoplasm of prostate: Secondary | ICD-10-CM

## 2015-10-30 DIAGNOSIS — R0902 Hypoxemia: Secondary | ICD-10-CM | POA: Diagnosis present

## 2015-10-30 DIAGNOSIS — I10 Essential (primary) hypertension: Secondary | ICD-10-CM | POA: Diagnosis present

## 2015-10-30 DIAGNOSIS — F329 Major depressive disorder, single episode, unspecified: Secondary | ICD-10-CM | POA: Diagnosis present

## 2015-10-30 DIAGNOSIS — R6521 Severe sepsis with septic shock: Secondary | ICD-10-CM | POA: Diagnosis present

## 2015-10-30 DIAGNOSIS — F419 Anxiety disorder, unspecified: Secondary | ICD-10-CM | POA: Diagnosis present

## 2015-10-30 DIAGNOSIS — Z7951 Long term (current) use of inhaled steroids: Secondary | ICD-10-CM

## 2015-10-30 DIAGNOSIS — K219 Gastro-esophageal reflux disease without esophagitis: Secondary | ICD-10-CM | POA: Diagnosis present

## 2015-10-30 DIAGNOSIS — R54 Age-related physical debility: Secondary | ICD-10-CM | POA: Diagnosis present

## 2015-10-30 DIAGNOSIS — J698 Pneumonitis due to inhalation of other solids and liquids: Secondary | ICD-10-CM | POA: Diagnosis present

## 2015-10-30 LAB — I-STAT CG4 LACTIC ACID, ED: Lactic Acid, Venous: 1.49 mmol/L (ref 0.5–2.0)

## 2015-10-30 MED ORDER — IPRATROPIUM-ALBUTEROL 0.5-2.5 (3) MG/3ML IN SOLN
6.0000 mL | RESPIRATORY_TRACT | Status: DC
  Administered 2015-10-30 – 2015-10-31 (×3): 6 mL via RESPIRATORY_TRACT
  Filled 2015-10-30: qty 6
  Filled 2015-10-30: qty 3
  Filled 2015-10-30: qty 6

## 2015-10-30 MED ORDER — MAGNESIUM SULFATE 2 GM/50ML IV SOLN
2.0000 g | Freq: Once | INTRAVENOUS | Status: AC
  Administered 2015-10-30: 2 g via INTRAVENOUS
  Filled 2015-10-30: qty 50

## 2015-10-30 MED ORDER — SODIUM CHLORIDE 0.9 % IV BOLUS (SEPSIS)
500.0000 mL | INTRAVENOUS | Status: AC
  Administered 2015-10-31: 500 mL via INTRAVENOUS

## 2015-10-30 MED ORDER — SODIUM CHLORIDE 0.9 % IV BOLUS (SEPSIS): 1000.0000 mL | Freq: Once | INTRAVENOUS | Status: DC

## 2015-10-30 MED ORDER — ACETAMINOPHEN 500 MG PO TABS
1000.0000 mg | ORAL_TABLET | Freq: Once | ORAL | Status: AC
  Administered 2015-10-30: 1000 mg via ORAL
  Filled 2015-10-30: qty 2

## 2015-10-30 MED ORDER — SODIUM CHLORIDE 0.9 % IV BOLUS (SEPSIS)
1000.0000 mL | INTRAVENOUS | Status: AC
  Administered 2015-10-30 – 2015-10-31 (×2): 1000 mL via INTRAVENOUS

## 2015-10-30 MED ORDER — PIPERACILLIN-TAZOBACTAM 3.375 G IVPB 30 MIN
3.3750 g | Freq: Once | INTRAVENOUS | Status: AC
  Administered 2015-10-30: 3.375 g via INTRAVENOUS
  Filled 2015-10-30: qty 50

## 2015-10-30 MED ORDER — METHYLPREDNISOLONE SODIUM SUCC 125 MG IJ SOLR
125.0000 mg | Freq: Once | INTRAMUSCULAR | Status: AC
  Administered 2015-10-30: 125 mg via INTRAVENOUS
  Filled 2015-10-30: qty 2

## 2015-10-30 MED ORDER — SODIUM CHLORIDE 0.9 % IV SOLN
1500.0000 mg | Freq: Once | INTRAVENOUS | Status: AC
  Administered 2015-10-31: 1500 mg via INTRAVENOUS
  Filled 2015-10-30: qty 1500

## 2015-10-30 NOTE — ED Notes (Signed)
Portable chest x-ray at the bedside.  

## 2015-10-30 NOTE — ED Notes (Signed)
Son states patient took extra strength tylenol after lunch today.

## 2015-10-30 NOTE — ED Notes (Signed)
Per EMS - "pt. was walking around at home and his foot got stuck". EMS noticed pt. Became SOB while walking. PT. States he does not use home O2. Pt. Still has O2 sats of 86 to 87% on 3 liters of O2. Pt. States he has history of COPD.

## 2015-10-30 NOTE — ED Notes (Signed)
Pt. Had N/V in route to hospital and was given 4 mg of Zofran IV through a 20 gauge IV saline lock.

## 2015-10-30 NOTE — Progress Notes (Signed)
Pharmacy Code Sepsis Protocol  Time of code sepsis page: 2325 [x]  Antibiotics delivered at 2330  Were antibiotics ordered at the time of the code sepsis page? Yes Was it required to contact the physician? [x]  Physician not contacted []  Physician contacted to order antibiotics for code sepsis []  Physician contacted to recommend changing antibiotics   Anti-infectives    Start     Dose/Rate Route Frequency Ordered Stop   10/30/15 2330  piperacillin-tazobactam (ZOSYN) IVPB 3.375 g     3.375 g 100 mL/hr over 30 Minutes Intravenous  Once 10/30/15 2317     10/30/15 2330  vancomycin (VANCOCIN) 1,500 mg in sodium chloride 0.9 % 500 mL IVPB     1,500 mg 250 mL/hr over 120 Minutes Intravenous  Once 10/30/15 2317          Nurse education provided: [x]  Minutes left to administer antibiotics to achieve 1 hour goal [x]  Correct order of antibiotic administration []  Antibiotic Y-site compatibilities     Tayelor Osborne, Lujean Amel D, BCPS4/12/2015, 11:26 PM

## 2015-10-30 NOTE — ED Notes (Signed)
Dr. Claudine Mouton at the bedside, aware of code sepsis.

## 2015-10-30 NOTE — ED Provider Notes (Signed)
CSN: [id number]     Arrival date & time 10/30/15  2250 History  By signing my name below, I, [facility], attest that this documentation has been prepared under the direction and in the presence of Everlene Balls, MD. Electronically Signed: Virgel Bouquet, ED Scribe. 10/30/2015. 11:39 PM.   Chief Complaint  Patient presents with  . Shortness of Breath  . Weakness  . Code Sepsis    LEVEL V CAVEAT DUE TO ACUITY OF PATIENT The history is provided by the EMS personnel and medical records. The history is limited by the condition of the patient. No language interpreter was used.   HPI Comments: [name] is a 82 y.o. male brought in by EMS with an PMHx of COPD not on home O2 who presents to the Emergency Department complaining of  SOB onset earlier today while walking. History was given by nurse from report given by EMS. She reports that patient was walking when his foot got stuck and he became SOB while trying to get it loose. Endorses associated cough. Denies difficulty breathing and CP.  Past Medical History  Diagnosis Date  . COPD (chronic obstructive pulmonary disease) ([place])   . GERD (gastroesophageal reflux disease)   . Anxiety and depression   . Gout   . CVA (cerebral infarction) 2012    Left parietal ICH; "left him weaker on the right side" (03/13/2013)  . GIB (gastrointestinal bleeding) 04/12  . Diverticulosis   . Family history of anesthesia complication     "makes son nauseous" (03/13/2013)  . Hypertension   . Pneumonia     "several times; last time 12-18 months ago"  (03/13/2013)  . Exertional shortness of breath   . Arthritis     "neck; hands" (03/13/2013)  . Prostate cancer ([place])   . Stroke [facility])    Past Surgical History  Procedure Laterality Date  . Prostate surgery    . Total knee arthroplasty Right   . Shoulder surgery Right 2003    decompression; "fell and broke it years ago; doesn't have plates or screws in there;" (03/13/2013)  . Carotid stent insertion  Bilateral    Family History  Problem Relation Age of Onset  . Cancer Father   . Diabetes Sister   . Heart disease Sister   . Hyperlipidemia Sister   . Hypertension Sister    Social History  Substance Use Topics  . Smoking status: Former Smoker -- 1.50 packs/day for 54 years    Types: Cigarettes    Quit date: 07/26/1993  . Smokeless tobacco: Never Used  . Alcohol Use: Yes    Review of Systems  Unable to perform ROS: Acuity of condition   Allergies  Contrast media  Home Medications   Prior to Admission medications   Medication Sig Start Date End Date Taking? Authorizing Provider  albuterol (PROVENTIL HFA;VENTOLIN HFA) 108 (90 BASE) MCG/ACT inhaler Inhale 2 puffs into the lungs as needed for wheezing.    Historical Provider, MD  budesonide-formoterol (SYMBICORT) 80-4.5 MCG/ACT inhaler Inhale 2 puffs into the lungs 2 (two) times daily.    Historical Provider, MD  Cholecalciferol (VITAMIN D PO) Take 1,000 mg by mouth daily.     Historical Provider, MD  finasteride (PROSCAR) 5 MG tablet Take 5 mg by mouth daily.    Historical Provider, MD  mirtazapine (REMERON) 30 MG tablet Take 30 mg by mouth at bedtime.    Historical Provider, MD  omeprazole (PRILOSEC) 20 MG capsule Take 20 mg by mouth daily.  Historical Provider, MD  sertraline (ZOLOFT) 50 MG tablet Take 50 mg by mouth daily.    Historical Provider, MD   BP 95/58 mmHg  Pulse 90  Temp(Src) 103.2 F (39.6 C) (Rectal)  Resp 22  Ht 6' (1.829 m)  Wt 175 lb (79.379 kg)  BMI 23.73 kg/m2  SpO2 87% Physical Exam  Constitutional: Vital signs are normal. He appears well-developed and well-nourished.  Non-toxic appearance. He does not appear ill. He appears distressed.  HENT:  Head: Normocephalic and atraumatic.  Nose: Nose normal.  Mouth/Throat: Oropharynx is clear and moist. No oropharyngeal exudate.  Nasal canula in place.  Eyes: Conjunctivae and EOM are normal. Pupils are equal, round, and reactive to light. No scleral  icterus.  Neck: Normal range of motion. Neck supple. No tracheal deviation, no edema, no erythema and normal range of motion present. No thyroid mass and no thyromegaly present.  Cardiovascular: Normal rate, regular rhythm, S1 normal, S2 normal, normal heart sounds, intact distal pulses and normal pulses.  Exam reveals no gallop and no friction rub.   No murmur heard. Pulmonary/Chest: He is in respiratory distress. He has wheezes. He has no rhonchi. He has no rales.  Tachypnea.  Abdominal: Soft. Normal appearance and bowel sounds are normal. He exhibits no distension, no ascites and no mass. There is no hepatosplenomegaly. There is no tenderness. There is no rebound, no guarding and no CVA tenderness.  Musculoskeletal: Normal range of motion. He exhibits edema. He exhibits no tenderness.  Right lower extremity edema.  Lymphadenopathy:    He has no cervical adenopathy.  Neurological: He is alert. He has normal strength. No cranial nerve deficit or sensory deficit.  Skin: Skin is warm, dry and intact. No petechiae and no rash noted. He is not diaphoretic. No erythema. No pallor.  Nursing note and vitals reviewed.   ED Course  Procedures   DIAGNOSTIC STUDIES: Oxygen Saturation is 87% on 3L/min, low by my interpretation.    COORDINATION OF CARE: 11:13 PM Will order chest x-ray, labs, IV fluids, Zosyn, Vancocin, and Tylenol.Discussed treatment plan with pt at bedside and pt agreed to plan.   Labs Review Labs Reviewed  COMPREHENSIVE METABOLIC PANEL - Abnormal; Notable for the following:    Sodium 129 (*)    CO2 21 (*)    Glucose, Bld 129 (*)    BUN 22 (*)    Calcium 8.8 (*)    Albumin 3.3 (*)    ALT 15 (*)    GFR calc non Af Amer 52 (*)    All other components within normal limits  CBC WITH DIFFERENTIAL/PLATELET - Abnormal; Notable for the following:    Hemoglobin 11.5 (*)    HCT 36.0 (*)    Platelets 141 (*)    Neutro Abs 9.3 (*)    Lymphs Abs 0.3 (*)    All other components  within normal limits  URINALYSIS, ROUTINE W REFLEX MICROSCOPIC (NOT AT [facility]) - Abnormal; Notable for the following:    Hgb urine dipstick MODERATE (*)    All other components within normal limits  URINE MICROSCOPIC-ADD ON - Abnormal; Notable for the following:    Squamous Epithelial / LPF 0-5 (*)    All other components within normal limits  CULTURE, BLOOD (ROUTINE X 2)  CULTURE, BLOOD (ROUTINE X 2)  URINE CULTURE  I-STAT CG4 LACTIC ACID, ED  I-STAT CG4 LACTIC ACID, ED  I-STAT CG4 LACTIC ACID, ED    Imaging Review Ct Chest Wo Contrast  10/31/2015  CLINICAL DATA:  Fever, hypoxia and hypotension. Concern for pneumonia. EXAM: CT CHEST WITHOUT CONTRAST TECHNIQUE: Multidetector CT imaging of the chest was performed following the standard protocol without IV contrast. COMPARISON:  Radiographs yesterday.  Most recent chest CT 12/06/2008 FINDINGS: Mediastinum/Lymph Nodes: Normal caliber thoracic aorta with moderate atherosclerosis. Heart size is normal. Coronary artery calcifications are seen. No pericardial effusion. Small mediastinal lymph nodes. Limited assessment for hilar adenopathy given lack contrast, there is fullness in the left greater than right hilar region. Lungs/Pleura: Moderate to advanced emphysema. Central bronchial thickening, most prominent in the lower lobes. Ground-glass and confluent opacity in the dependent right lower lobe. Findings to a lesser extent in the left lower lobe. Left hilar fullness is suboptimally assessed without contrast. No pleural effusion. Bilateral upper lobe scarring. Upper abdomen: Hepatic cyst is again seen. No adrenal nodule. No acute abnormality in the upper abdomen. Musculoskeletal: There are no acute or suspicious osseous abnormalities. IMPRESSION: 1. Dependent bilateral lower lobe ground-glass and confluent opacities, right greater than left. Pneumonia versus aspiration. As these findings are not well assessed radiographically, recommend follow-up chest CT  in 3-4 weeks after course of treatment to ensure resolution and exclude underlying neoplasm, particularly given the underlying emphysema. 2. Left hilar soft tissue fullness, suboptimally assessed without contrast. Recommend follow-up exam be performed with IV contrast to evaluate left hilar structures. Electronically Signed   By: Jeb Levering M.D.   On: 10/31/2015 03:18   Dg Chest Port 1 View  10/30/2015  CLINICAL DATA:  Sepsis EXAM: PORTABLE CHEST 1 VIEW COMPARISON:  06/06/2015 FINDINGS: Chronic interstitial coarsening is again evident. No alveolar consolidation. No large effusion. Normal pulmonary vasculature. Hilar, mediastinal and cardiac contours are unremarkable and unchanged. IMPRESSION: Chronic interstitial coarsening.  No acute cardiopulmonary findings. Electronically Signed   By: Andreas Newport M.D.   On: 10/30/2015 23:30   I have personally reviewed and evaluated these images and lab results as part of my medical decision-making.   EKG Interpretation   Date/Time:  Thursday October 30 2015 22:51:13 EDT Ventricular Rate:  88 PR Interval:  153 QRS Duration: 131 QT Interval:  386 QTC Calculation: 467 R Axis:   89 Text Interpretation:  Sinus rhythm Atrial premature complex Right bundle  branch block Confirmed by Ashok Cordia  MD, Lennette Bihari ([id number]) on 10/30/2015 11:10:37  PM      MDM   Final diagnoses:  None    Patient presents to the ED for acute episode of SOB.  He has a history of COPD but does not wear O2 at home. He is requiring 4LNC here for hypoxia.  Code sepsis immediately called as the patient has a fever as well.  Likely source is pneumonia.  Patient will require admission for further care.  He will get 30cc/kg for hypotension.  Chest x-rays negative for pneumonia, will obtain CT scan for further evaluation.   CT scan reveals Bilateral lower lobe pneumonia, right greater than left. He has now been placed on norepinephrine drip for persistent hypotension despite fluid  resuscitation. I spoke with Dr. Detterding with the ICU who will accept the patient for admission.   CRITICAL CARE Performed by: Everlene Balls   Total critical care time: 55 minutes - sepsis with persistent hypotension after fluid resuscitation  Critical care time was exclusive of separately billable procedures and treating other patients.  Critical care was necessary to treat or prevent imminent or life-threatening deterioration.  Critical care was time spent personally by me on the following activities: development of treatment plan  with patient and/or surrogate as well as nursing, discussions with consultants, evaluation of patient's response to treatment, examination of patient, obtaining history from patient or surrogate, ordering and performing treatments and interventions, ordering and review of laboratory studies, ordering and review of radiographic studies, pulse oximetry and re-evaluation of patient's condition.  I personally performed the services described in this documentation, which was scribed in my presence. The recorded information has been reviewed and is accurate.      Everlene Balls, MD 10/31/15 [phone]

## 2015-10-31 ENCOUNTER — Emergency Department (HOSPITAL_COMMUNITY): Payer: Medicare Other

## 2015-10-31 DIAGNOSIS — A419 Sepsis, unspecified organism: Secondary | ICD-10-CM | POA: Insufficient documentation

## 2015-10-31 DIAGNOSIS — Z87891 Personal history of nicotine dependence: Secondary | ICD-10-CM | POA: Diagnosis not present

## 2015-10-31 DIAGNOSIS — Z91041 Radiographic dye allergy status: Secondary | ICD-10-CM | POA: Diagnosis not present

## 2015-10-31 DIAGNOSIS — R001 Bradycardia, unspecified: Secondary | ICD-10-CM | POA: Diagnosis present

## 2015-10-31 DIAGNOSIS — R6521 Severe sepsis with septic shock: Secondary | ICD-10-CM | POA: Diagnosis present

## 2015-10-31 DIAGNOSIS — J690 Pneumonitis due to inhalation of food and vomit: Secondary | ICD-10-CM | POA: Insufficient documentation

## 2015-10-31 DIAGNOSIS — Z8546 Personal history of malignant neoplasm of prostate: Secondary | ICD-10-CM | POA: Diagnosis not present

## 2015-10-31 DIAGNOSIS — J698 Pneumonitis due to inhalation of other solids and liquids: Secondary | ICD-10-CM | POA: Diagnosis present

## 2015-10-31 DIAGNOSIS — K219 Gastro-esophageal reflux disease without esophagitis: Secondary | ICD-10-CM | POA: Diagnosis present

## 2015-10-31 DIAGNOSIS — Z96651 Presence of right artificial knee joint: Secondary | ICD-10-CM | POA: Diagnosis present

## 2015-10-31 DIAGNOSIS — J189 Pneumonia, unspecified organism: Secondary | ICD-10-CM

## 2015-10-31 DIAGNOSIS — I10 Essential (primary) hypertension: Secondary | ICD-10-CM | POA: Diagnosis present

## 2015-10-31 DIAGNOSIS — R54 Age-related physical debility: Secondary | ICD-10-CM | POA: Diagnosis present

## 2015-10-31 DIAGNOSIS — Z79899 Other long term (current) drug therapy: Secondary | ICD-10-CM | POA: Diagnosis not present

## 2015-10-31 DIAGNOSIS — M109 Gout, unspecified: Secondary | ICD-10-CM | POA: Diagnosis present

## 2015-10-31 DIAGNOSIS — F419 Anxiety disorder, unspecified: Secondary | ICD-10-CM | POA: Diagnosis present

## 2015-10-31 DIAGNOSIS — F329 Major depressive disorder, single episode, unspecified: Secondary | ICD-10-CM | POA: Diagnosis present

## 2015-10-31 DIAGNOSIS — Z7951 Long term (current) use of inhaled steroids: Secondary | ICD-10-CM | POA: Diagnosis not present

## 2015-10-31 DIAGNOSIS — E872 Acidosis: Secondary | ICD-10-CM | POA: Diagnosis present

## 2015-10-31 DIAGNOSIS — G9341 Metabolic encephalopathy: Secondary | ICD-10-CM | POA: Diagnosis present

## 2015-10-31 DIAGNOSIS — J432 Centrilobular emphysema: Secondary | ICD-10-CM | POA: Diagnosis not present

## 2015-10-31 DIAGNOSIS — Z8673 Personal history of transient ischemic attack (TIA), and cerebral infarction without residual deficits: Secondary | ICD-10-CM | POA: Diagnosis not present

## 2015-10-31 DIAGNOSIS — Z22322 Carrier or suspected carrier of Methicillin resistant Staphylococcus aureus: Secondary | ICD-10-CM | POA: Diagnosis not present

## 2015-10-31 DIAGNOSIS — J449 Chronic obstructive pulmonary disease, unspecified: Secondary | ICD-10-CM | POA: Diagnosis present

## 2015-10-31 DIAGNOSIS — R0600 Dyspnea, unspecified: Secondary | ICD-10-CM | POA: Diagnosis not present

## 2015-10-31 DIAGNOSIS — R0902 Hypoxemia: Secondary | ICD-10-CM | POA: Diagnosis present

## 2015-10-31 DIAGNOSIS — B359 Dermatophytosis, unspecified: Secondary | ICD-10-CM | POA: Diagnosis present

## 2015-10-31 LAB — CBC WITH DIFFERENTIAL/PLATELET
BASOS PCT: 0 %
Basophils Absolute: 0 10*3/uL (ref 0.0–0.1)
EOS ABS: 0 10*3/uL (ref 0.0–0.7)
EOS PCT: 0 %
HCT: 36 % — ABNORMAL LOW (ref 39.0–52.0)
HEMOGLOBIN: 11.5 g/dL — AB (ref 13.0–17.0)
Lymphocytes Relative: 3 %
Lymphs Abs: 0.3 10*3/uL — ABNORMAL LOW (ref 0.7–4.0)
MCH: 27.1 pg (ref 26.0–34.0)
MCHC: 31.9 g/dL (ref 30.0–36.0)
MCV: 84.9 fL (ref 78.0–100.0)
MONO ABS: 0.6 10*3/uL (ref 0.1–1.0)
Monocytes Relative: 6 %
NEUTROS ABS: 9.3 10*3/uL — AB (ref 1.7–7.7)
NEUTROS PCT: 91 %
PLATELETS: 141 10*3/uL — AB (ref 150–400)
RBC: 4.24 MIL/uL (ref 4.22–5.81)
RDW: 15 % (ref 11.5–15.5)
WBC: 10.2 10*3/uL (ref 4.0–10.5)

## 2015-10-31 LAB — TROPONIN I

## 2015-10-31 LAB — COMPREHENSIVE METABOLIC PANEL
ALBUMIN: 3.3 g/dL — AB (ref 3.5–5.0)
ALK PHOS: 59 U/L (ref 38–126)
ALT: 15 U/L — AB (ref 17–63)
AST: 40 U/L (ref 15–41)
Anion gap: 7 (ref 5–15)
BILIRUBIN TOTAL: 1.2 mg/dL (ref 0.3–1.2)
BUN: 22 mg/dL — AB (ref 6–20)
CALCIUM: 8.8 mg/dL — AB (ref 8.9–10.3)
CO2: 21 mmol/L — AB (ref 22–32)
Chloride: 101 mmol/L (ref 101–111)
Creatinine, Ser: 1.23 mg/dL (ref 0.61–1.24)
GFR calc Af Amer: >60 mL/min (ref >60)
GFR calc non Af Amer: 52 mL/min — ABNORMAL LOW (ref >60)
GLUCOSE: 129 mg/dL — AB (ref 65–99)
Potassium: 4.8 mmol/L (ref 3.5–5.1)
SODIUM: 129 mmol/L — AB (ref 135–145)
TOTAL PROTEIN: 6.5 g/dL (ref 6.5–8.1)

## 2015-10-31 LAB — URINE MICROSCOPIC-ADD ON: Bacteria, UA: NONE SEEN

## 2015-10-31 LAB — INFLUENZA PANEL BY PCR (TYPE A & B)
H1N1 flu by pcr: NOT DETECTED
Influenza A By PCR: NEGATIVE
Influenza B By PCR: NEGATIVE

## 2015-10-31 LAB — GLUCOSE, CAPILLARY
GLUCOSE-CAPILLARY: 131 mg/dL — AB (ref 65–99)
GLUCOSE-CAPILLARY: 102 mg/dL — AB (ref 65–99)
Glucose-Capillary: 235 mg/dL — ABNORMAL HIGH (ref 65–99)
Glucose-Capillary: 170 mg/dL — ABNORMAL HIGH (ref 65–99)

## 2015-10-31 LAB — URINALYSIS, ROUTINE W REFLEX MICROSCOPIC
BILIRUBIN URINE: NEGATIVE
Glucose, UA: NEGATIVE mg/dL
Ketones, ur: NEGATIVE mg/dL
LEUKOCYTES UA: NEGATIVE
NITRITE: NEGATIVE
PH: 8 (ref 5.0–8.0)
Protein, ur: NEGATIVE mg/dL
SPECIFIC GRAVITY, URINE: 1.018 (ref 1.005–1.030)

## 2015-10-31 LAB — RAPID URINE DRUG SCREEN, HOSP PERFORMED
AMPHETAMINES: NOT DETECTED
BENZODIAZEPINES: NOT DETECTED
Barbiturates: NOT DETECTED
COCAINE: NOT DETECTED
OPIATES: NOT DETECTED
Tetrahydrocannabinol: NOT DETECTED

## 2015-10-31 LAB — POCT I-STAT 3, ART BLOOD GAS (G3+)
ACID-BASE DEFICIT: 6 mmol/L — AB (ref 0.0–2.0)
Bicarbonate: 20.3 mEq/L (ref 20.0–24.0)
O2 SAT: 96 %
PCO2 ART: 40.4 mmHg (ref 35.0–45.0)
TCO2: 22 mmol/L (ref 0–100)
pH, Arterial: 7.31 — ABNORMAL LOW (ref 7.350–7.450)
pO2, Arterial: 93 mmHg (ref 80.0–100.0)

## 2015-10-31 LAB — I-STAT CG4 LACTIC ACID, ED
LACTIC ACID, VENOUS: 0.69 mmol/L (ref 0.5–2.0)
LACTIC ACID, VENOUS: 0.96 mmol/L (ref 0.5–2.0)

## 2015-10-31 LAB — MRSA PCR SCREENING: MRSA by PCR: POSITIVE — AB

## 2015-10-31 LAB — ETHANOL

## 2015-10-31 MED ORDER — CETYLPYRIDINIUM CHLORIDE 0.05 % MT LIQD
7.0000 mL | Freq: Two times a day (BID) | OROMUCOSAL | Status: DC
  Administered 2015-10-31 – 2015-11-05 (×11): 7 mL via OROMUCOSAL

## 2015-10-31 MED ORDER — FAMOTIDINE 20 MG PO TABS
20.0000 mg | ORAL_TABLET | Freq: Two times a day (BID) | ORAL | Status: DC
  Administered 2015-10-31 – 2015-11-05 (×11): 20 mg via ORAL
  Filled 2015-10-31 (×11): qty 1

## 2015-10-31 MED ORDER — DEXTROSE 5 % IV SOLN
500.0000 mg | INTRAVENOUS | Status: DC
  Administered 2015-10-31 – 2015-11-02 (×3): 500 mg via INTRAVENOUS
  Filled 2015-10-31 (×3): qty 500

## 2015-10-31 MED ORDER — ACETAMINOPHEN 325 MG PO TABS
650.0000 mg | ORAL_TABLET | ORAL | Status: DC | PRN
Start: 2015-10-31 — End: 2015-11-05

## 2015-10-31 MED ORDER — IPRATROPIUM-ALBUTEROL 0.5-2.5 (3) MG/3ML IN SOLN: 3.0000 mL | Freq: Four times a day (QID) | RESPIRATORY_TRACT | Status: DC | PRN

## 2015-10-31 MED ORDER — SERTRALINE HCL 100 MG PO TABS
100.0000 mg | ORAL_TABLET | Freq: Two times a day (BID) | ORAL | Status: DC
  Administered 2015-10-31 – 2015-11-05 (×11): 100 mg via ORAL
  Filled 2015-10-31 (×8): qty 1
  Filled 2015-10-31: qty 2
  Filled 2015-10-31 (×4): qty 1

## 2015-10-31 MED ORDER — INSULIN ASPART 100 UNIT/ML ~~LOC~~ SOLN
0.0000 [IU] | SUBCUTANEOUS | Status: DC
  Administered 2015-10-31: 2 [IU] via SUBCUTANEOUS
  Administered 2015-10-31 – 2015-11-03 (×6): 1 [IU] via SUBCUTANEOUS
  Administered 2015-11-04: 2 [IU] via SUBCUTANEOUS

## 2015-10-31 MED ORDER — MUPIROCIN 2 % EX OINT
1.0000 "application " | TOPICAL_OINTMENT | Freq: Two times a day (BID) | CUTANEOUS | Status: AC
  Administered 2015-10-31 – 2015-11-05 (×10): 1 via NASAL
  Filled 2015-10-31 (×4): qty 22

## 2015-10-31 MED ORDER — SODIUM CHLORIDE 0.9 % IV SOLN: 250.0000 mL | INTRAVENOUS | Status: DC | PRN

## 2015-10-31 MED ORDER — MOMETASONE FURO-FORMOTEROL FUM 100-5 MCG/ACT IN AERO
2.0000 | INHALATION_SPRAY | Freq: Two times a day (BID) | RESPIRATORY_TRACT | Status: DC
  Administered 2015-10-31 – 2015-11-05 (×11): 2 via RESPIRATORY_TRACT
  Filled 2015-10-31: qty 8.8

## 2015-10-31 MED ORDER — CHLORHEXIDINE GLUCONATE CLOTH 2 % EX PADS
6.0000 | MEDICATED_PAD | Freq: Every day | CUTANEOUS | Status: AC
  Administered 2015-11-01 – 2015-11-05 (×5): 6 via TOPICAL

## 2015-10-31 MED ORDER — CEFTRIAXONE SODIUM 1 G IJ SOLR: 2.0000 g | INTRAMUSCULAR | Status: DC

## 2015-10-31 MED ORDER — SODIUM CHLORIDE 0.9 % IV BOLUS (SEPSIS)
1000.0000 mL | Freq: Once | INTRAVENOUS | Status: AC
  Administered 2015-10-31: 1000 mL via INTRAVENOUS

## 2015-10-31 MED ORDER — DEXTROSE 5 % IV SOLN
2.0000 g | INTRAVENOUS | Status: DC
  Administered 2015-10-31 – 2015-11-03 (×4): 2 g via INTRAVENOUS
  Filled 2015-10-31 (×5): qty 2

## 2015-10-31 MED ORDER — IBUPROFEN 400 MG PO TABS
600.0000 mg | ORAL_TABLET | Freq: Once | ORAL | Status: AC
  Administered 2015-10-31: 600 mg via ORAL
  Filled 2015-10-31: qty 1

## 2015-10-31 MED ORDER — ENOXAPARIN SODIUM 40 MG/0.4ML ~~LOC~~ SOLN
40.0000 mg | SUBCUTANEOUS | Status: DC
  Administered 2015-10-31 – 2015-11-05 (×6): 40 mg via SUBCUTANEOUS
  Filled 2015-10-31 (×6): qty 0.4

## 2015-10-31 MED ORDER — METHYLPREDNISOLONE SODIUM SUCC 125 MG IJ SOLR
60.0000 mg | Freq: Two times a day (BID) | INTRAMUSCULAR | Status: AC
  Administered 2015-10-31 – 2015-11-01 (×3): 60 mg via INTRAVENOUS
  Filled 2015-10-31 (×2): qty 0.96
  Filled 2015-10-31 (×2): qty 2

## 2015-10-31 MED ORDER — SODIUM CHLORIDE 0.9 % IV SOLN
INTRAVENOUS | Status: DC
  Administered 2015-10-31: 20:00:00 via INTRAVENOUS

## 2015-10-31 MED ORDER — ATROPINE SULFATE 0.1 MG/ML IJ SOLN
INTRAMUSCULAR | Status: AC
  Filled 2015-10-31: qty 10

## 2015-10-31 MED ORDER — NOREPINEPHRINE BITARTRATE 1 MG/ML IV SOLN
2.0000 ug/min | INTRAVENOUS | Status: DC
  Administered 2015-10-31: 2 ug/min via INTRAVENOUS
  Filled 2015-10-31 (×2): qty 4

## 2015-10-31 NOTE — Progress Notes (Addendum)
Notified MD Corrie Dandy in regards to patient HR brady to 37. Patient was asleep but easy to awaken. Patient has been off Levophed since 1110, BP stable. Will place Atropine at beside and monitor for now.

## 2015-10-31 NOTE — ED Notes (Signed)
Attempted report. No assignment made yet for oncoming shift. Currently in report. Left call back number [phone].

## 2015-10-31 NOTE — ED Notes (Signed)
Called CT for ETA.

## 2015-10-31 NOTE — H&P (Signed)
PULMONARY / CRITICAL CARE MEDICINE   Name: [name] MRN: [id number] DOB: March 04, 1930    ADMISSION DATE:  10/30/2015 CONSULTATION DATE:  10/31/2015  REFERRING MD:  EDP  CHIEF COMPLAINT: Shortness of breath  HISTORY OF PRESENT ILLNESS:   [name] is an 82 year old male with a past medical history of COPD not on home O2, CVA in 2012 with residual  right sided weakness, gout, HTN presenting with shortness of breath. Patient's son at bedside provided most of the history. Reports that for the past 2-3 days the patient has not been feeling well with non-specific complaints. Has not been eating or drinking well. Son reports patient has has generalized leg weakness which he has had with PNA in the past. Reported subjected fever 2 days ago and non-productive cough. Tonight, patient became short of breath tonight after exerting himself and was unable to catch his breath. Patient was febrile, hypotensive and hypoxic requiring 4L [place] to maintain O2 sats on arrival. Received 2.5 L NS in the ED and remained hypotensive. Started on norepinephrine with good response. CXR in the ED was read as negative for PNA but CT scan reveals bilateral lobe pneumonia, right greater than left. Given a dose of vancomycin and zosyn in the ED. Denies any chills, chest pain, cough, sputum production, nausea, vomiting, diarrhea, dysuria. No sick contacts.    SUBJECTIVE:  Pt feels better since coming in. On and off drowsiness. (-) cp. Still with SOB. (+) fevers.   VITAL SIGNS: BP 93/54 mmHg  Pulse 53  Temp(Src) 97.7 F (36.5 C) (Oral)  Resp 20  Ht 6' (1.829 m)  Wt 159 lb 9.8 oz (72.4 kg)  BMI 21.64 kg/m2  SpO2 95%  HEMODYNAMICS:    VENTILATOR SETTINGS:    INTAKE / OUTPUT: I/O last 3 completed shifts: In: 2550 [I.V.:2550] Out: 42 [Urine:75]  PHYSICAL EXAMINATION: General: well developed, frail appearing elderly male in no acute distress Neuro: Alert and oriented to person and place not time, no focal deficits.  Drowsy at times.  HEENT:  PERRLA, EMOI. Cardiovascular: RRR, no murmurs rubs or gallops Lungs: No increased work of breathing, scattered wheezes, diffuse coarse breath sounds Abdomen: soft, non-tender, non-distended, BS+ Skin:  Warm, dry  LABS:  BMET  Recent Labs Lab 10/30/15 2328  NA 129*  K 4.8  CL 101  CO2 21*  BUN 22*  CREATININE 1.23  GLUCOSE 129*    Electrolytes  Recent Labs Lab 10/30/15 2328  CALCIUM 8.8*    CBC  Recent Labs Lab 10/30/15 2328  WBC 10.2  HGB 11.5*  HCT 36.0*  PLT 141*    Coag's No results for input(s): APTT, INR in the last 168 hours.  Sepsis Markers  Recent Labs Lab 10/30/15 2343 10/31/15 0159 10/31/15 0500  LATICACIDVEN 1.49 0.69 0.96    ABG  Recent Labs Lab 10/31/15 1059  PHART 7.310*  PCO2ART 40.4  PO2ART 93.0    Liver Enzymes  Recent Labs Lab 10/30/15 2328  AST 40  ALT 15*  ALKPHOS 59  BILITOT 1.2  ALBUMIN 3.3*    Cardiac Enzymes No results for input(s): TROPONINI, PROBNP in the last 168 hours.  Glucose  Recent Labs Lab 10/31/15 0857 10/31/15 1219  GLUCAP 235* 170*    Imaging Ct Chest Wo Contrast  10/31/2015  CLINICAL DATA:  Fever, hypoxia and hypotension. Concern for pneumonia. EXAM: CT CHEST WITHOUT CONTRAST TECHNIQUE: Multidetector CT imaging of the chest was performed following the standard protocol without IV contrast. COMPARISON:  Radiographs yesterday.  Most recent chest CT 12/06/2008 FINDINGS: Mediastinum/Lymph Nodes: Normal caliber thoracic aorta with moderate atherosclerosis. Heart size is normal. Coronary artery calcifications are seen. No pericardial effusion. Small mediastinal lymph nodes. Limited assessment for hilar adenopathy given lack contrast, there is fullness in the left greater than right hilar region. Lungs/Pleura: Moderate to advanced emphysema. Central bronchial thickening, most prominent in the lower lobes. Ground-glass and confluent opacity in the dependent right lower  lobe. Findings to a lesser extent in the left lower lobe. Left hilar fullness is suboptimally assessed without contrast. No pleural effusion. Bilateral upper lobe scarring. Upper abdomen: Hepatic cyst is again seen. No adrenal nodule. No acute abnormality in the upper abdomen. Musculoskeletal: There are no acute or suspicious osseous abnormalities. IMPRESSION: 1. Dependent bilateral lower lobe ground-glass and confluent opacities, right greater than left. Pneumonia versus aspiration. As these findings are not well assessed radiographically, recommend follow-up chest CT in 3-4 weeks after course of treatment to ensure resolution and exclude underlying neoplasm, particularly given the underlying emphysema. 2. Left hilar soft tissue fullness, suboptimally assessed without contrast. Recommend follow-up exam be performed with IV contrast to evaluate left hilar structures. Electronically Signed   By: Jeb Levering M.D.   On: 10/31/2015 03:18   Dg Chest Port 1 View  10/30/2015  CLINICAL DATA:  Sepsis EXAM: PORTABLE CHEST 1 VIEW COMPARISON:  06/06/2015 FINDINGS: Chronic interstitial coarsening is again evident. No alveolar consolidation. No large effusion. Normal pulmonary vasculature. Hilar, mediastinal and cardiac contours are unremarkable and unchanged. IMPRESSION: Chronic interstitial coarsening.  No acute cardiopulmonary findings. Electronically Signed   By: Andreas Newport M.D.   On: 10/30/2015 23:30   STUDIES:  10/30/2015 CXR: Chronic interstitial coarsening. No acute cardiopulmonary findings. 10/31/2015 CT [place]: Dependent bilateral lower lobe ground-glass and confluent opacities, right greater than left. Pneumonia versus aspiration  CULTURES: 10/30/2015 BCx x2 > 4/7 influenza >   ANTIBIOTICS: 10/30/2015 Vanc and Zosyn x1 10/31/2015 Ceftriaxone > 10/31/2015 Azithromycin >  SIGNIFICANT EVENTS:  LINES/TUBES: 10/30/2015 PIV left forearm, right antecubital  DISCUSSION: 82 year old male with a history of  COPD presents with shortness of breath found to have bilateral pulmonary infiltrates with associated hypotension not responsive to fluids and started on pressors  ASSESSMENT / PLAN:  PULMONARY A: Acute Hypoxia secondary to PNA, Bibasilar, concern for aspiration pna. DDx : cardiac with nstemi but less likely though, possible CHF but no history.  COPD P:   Continue home Symbicort BID > on dulera while admitted Duonebs q6hr prn Supplemental O2 keep sats 88-92% steroids  CARDIOVASCULAR A:  Hx of HTN R/O cardiac causes for SOB > ? CHF/ ? NSTEMI or demand ischemia P:  Hypotensive on pressors, monitor > BP better post 3.5 L. Off pressors.  trned troponin. Check echo.   RENAL A:   Metabolic acidosis 2/2 sepsis. Lactate N.  P:   Rx infxn. Cont IVF 75 mls/hr  GASTROINTESTINAL A:   GI Ulcer PPx P:   Pepcid  HEMATOLOGIC A:   DVT PPx P:  Lovenox  INFECTIOUS A:   Severe CAP Septic Shock > clinically better P:   Ceftriaxone 2g and Azithromycin 500 mg Follow up blood cultures Levophed, wean as tolerated Solumedrol 60 mg q12hr x3 doses NS 75 ml/hr CBC/BMP in am  ENDOCRINE A:   No abnormalities P:   No interventions  NEUROLOGIC A:   Acute encephalopathy secondary to severe sepsis P:   RASS goal: 0 Check alcohol level, UDS  Critical care time with this pt today : 32 minutes.   FAMILY  - Updates: Updated son re: pt's condition. Pt was a DNR in the past but per son, he wants him full code now and reassess when pt gets worse.   - Inter-disciplinary family meet or Palliative Care meeting due by:  11/07/2015   J. Shirl Harris, MD 10/31/2015, 1:10 PM [facility] Pager [phone] After 3 pm or if no answer, call [phone]

## 2015-10-31 NOTE — H&P (Signed)
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 Patient's son at bedside provided most of the history. Reports that for the past 2-3 days the patient has not been feeling well with non-specific complaints. Has not been eating or drinking well. Son reports patient has has generalized leg weakness which he has had with PNA in the past. Reported subjected fever 2 days ago and non-productive cough. Tonight, patient became short of breath tonight after exerting himself and was unable to catch his breath. Patient was febrile, hypotensive and hypoxic requiring 4L [place] to maintain O2 sats on arrival. Received 2.5 L NS in the ED and remained hypotensive. Started on norepinephrine with good response. CXR in the ED was read as negative for PNA but CT scan reveals bilateral lobe pneumonia, right greater than left. Given a dose of vancomycin and zosyn in the ED. Denies any chills, chest pain, cough, sputum production, nausea, vomiting, diarrhea, dysuria. No sick contacts.   PAST MEDICAL HISTORY :  He  has a past medical history of COPD (chronic obstructive pulmonary disease) ([place]); GERD (gastroesophageal reflux disease); Anxiety and depression; Gout; CVA (cerebral infarction) (2012); GIB (gastrointestinal bleeding) (04/12); Diverticulosis; Family history of anesthesia complication; Hypertension; Pneumonia; Exertional shortness of breath; Arthritis; Prostate cancer ([place]); and Stroke ([place]).  PAST SURGICAL HISTORY: He  has past surgical history that includes Prostate surgery; Total knee arthroplasty (Right); Shoulder surgery (Right, 2003); and Carotid  stent insertion (Bilateral).  Allergies  Allergen Reactions  . Contrast Media [Iodinated Diagnostic Agents] Hives, Shortness Of Breath, Swelling and Rash    No current facility-administered medications on file prior to encounter.   Current Outpatient Prescriptions on File Prior to Encounter  Medication Sig  . albuterol (PROVENTIL HFA;VENTOLIN HFA) 108 (90 BASE) MCG/ACT inhaler Inhale 2 puffs into the lungs as needed for wheezing.  . budesonide-formoterol (SYMBICORT) 80-4.5 MCG/ACT inhaler Inhale 2 puffs into the lungs 2 (two) times daily.  . Cholecalciferol (VITAMIN D PO) Take 1,000 mg by mouth daily.   . finasteride (PROSCAR) 5 MG tablet Take 5 mg by mouth daily.  . mirtazapine (REMERON) 30 MG tablet Take 30 mg by mouth at bedtime.  Marland Kitchen omeprazole (PRILOSEC) 20 MG capsule Take 20 mg by mouth daily.      FAMILY HISTORY:  His has no family status information on file.   SOCIAL HISTORY: He  reports that he quit smoking about 22 years ago. His smoking use included Cigarettes. He has a 81 pack-year smoking history. He has never used smokeless tobacco. He reports that he drinks alcohol. He reports that he does not use illicit drugs.  REVIEW OF SYSTEMS:   Negative except as stated in HPI  SUBJECTIVE:   VITAL SIGNS: BP 99/52 mmHg  Pulse 55  Temp(Src) 101.3 F (38.5 C) (Rectal)  Resp 21  Ht 6' (1.829 m)  Wt 175 lb (79.379 kg)  BMI 23.73 kg/m2  SpO2 93%  HEMODYNAMICS:    VENTILATOR SETTINGS:    INTAKE / OUTPUT:    PHYSICAL EXAMINATION: General: well developed, frail appearing elderly male in no acute distress Neuro: Alert and oriented  to person and place not time, no focal deficits HEENT:  PERRLA, EMOI. Cardiovascular: RRR, no murmurs rubs or gallops Lungs: No increased work of breathing, scattered wheezes, diffuse coarse breath sounds Abdomen: soft, non-tender, non-distended, BS+ Skin:  Warm, dry  LABS:  BMET  Recent Labs Lab 10/30/15 2328  NA 129*  K 4.8  CL  101  CO2 21*  BUN 22*  CREATININE 1.23  GLUCOSE 129*    Electrolytes  Recent Labs Lab 10/30/15 2328  CALCIUM 8.8*    CBC  Recent Labs Lab 10/30/15 2328  WBC 10.2  HGB 11.5*  HCT 36.0*  PLT 141*    Coag's No results for input(s): APTT, INR in the last 168 hours.  Sepsis Markers  Recent Labs Lab 10/30/15 2343 10/31/15 0159 10/31/15 0500  LATICACIDVEN 1.49 0.69 0.96    ABG No results for input(s): PHART, PCO2ART, PO2ART in the last 168 hours.  Liver Enzymes  Recent Labs Lab 10/30/15 2328  AST 40  ALT 15*  ALKPHOS 59  BILITOT 1.2  ALBUMIN 3.3*    Cardiac Enzymes No results for input(s): TROPONINI, PROBNP in the last 168 hours.  Glucose No results for input(s): GLUCAP in the last 168 hours.  Imaging Ct Chest Wo Contrast  10/31/2015  CLINICAL DATA:  Fever, hypoxia and hypotension. Concern for pneumonia. EXAM: CT CHEST WITHOUT CONTRAST TECHNIQUE: Multidetector CT imaging of the chest was performed following the standard protocol without IV contrast. COMPARISON:  Radiographs yesterday.  Most recent chest CT 12/06/2008 FINDINGS: Mediastinum/Lymph Nodes: Normal caliber thoracic aorta with moderate atherosclerosis. Heart size is normal. Coronary artery calcifications are seen. No pericardial effusion. Small mediastinal lymph nodes. Limited assessment for hilar adenopathy given lack contrast, there is fullness in the left greater than right hilar region. Lungs/Pleura: Moderate to advanced emphysema. Central bronchial thickening, most prominent in the lower lobes. Ground-glass and confluent opacity in the dependent right lower lobe. Findings to a lesser extent in the left lower lobe. Left hilar fullness is suboptimally assessed without contrast. No pleural effusion. Bilateral upper lobe scarring. Upper abdomen: Hepatic cyst is again seen. No adrenal nodule. No acute abnormality in the upper abdomen. Musculoskeletal: There are no acute or suspicious osseous  abnormalities. IMPRESSION: 1. Dependent bilateral lower lobe ground-glass and confluent opacities, right greater than left. Pneumonia versus aspiration. As these findings are not well assessed radiographically, recommend follow-up chest CT in 3-4 weeks after course of treatment to ensure resolution and exclude underlying neoplasm, particularly given the underlying emphysema. 2. Left hilar soft tissue fullness, suboptimally assessed without contrast. Recommend follow-up exam be performed with IV contrast to evaluate left hilar structures. Electronically Signed   By: Jeb Levering M.D.   On: 10/31/2015 03:18   Dg Chest Port 1 View  10/30/2015  CLINICAL DATA:  Sepsis EXAM: PORTABLE CHEST 1 VIEW COMPARISON:  06/06/2015 FINDINGS: Chronic interstitial coarsening is again evident. No alveolar consolidation. No large effusion. Normal pulmonary vasculature. Hilar, mediastinal and cardiac contours are unremarkable and unchanged. IMPRESSION: Chronic interstitial coarsening.  No acute cardiopulmonary findings. Electronically Signed   By: Andreas Newport M.D.   On: 10/30/2015 23:30   STUDIES:  10/30/2015 CXR: Chronic interstitial coarsening. No acute cardiopulmonary findings. 10/31/2015 CT Chset: Dependent bilateral lower lobe ground-glass and confluent opacities, right greater than left. Pneumonia versus aspiration  CULTURES: 10/30/2015 BCx x2 >  ANTIBIOTICS: 10/30/2015 Vanc and Zosyn x1 10/31/2015 Ceftriaxone > 10/31/2015 Azithromycin >  SIGNIFICANT EVENTS:  LINES/TUBES: 10/30/2015 PIV left forearm, right antecubital  DISCUSSION: 82 year  old male with a history of COPD presents with shortness of breath found to have bilateral pulmonary infiltrates with associated hypotension not responsive to fluids and started on pressors  ASSESSMENT / PLAN:  PULMONARY A: Acute Hypoxia secondary to PNA COPD P:   Continue home Symbicort BID Duonebs q6hr prn Supplemental O2 keep sats 88-92%  CARDIOVASCULAR A:  Hx of  HTN P:  Hypotensive on pressors, monitor  RENAL A:   No abnormalities P:   No interventions  GASTROINTESTINAL A:   GI Ulcer PPx P:   Pepcid  HEMATOLOGIC A:   DVT PPx P:  Lovenox                                                                                                             INFECTIOUS A:   Severe CAP Septic Shock P:   Ceftriaxone 2g and Azithromycin 500 mg Follow up blood cultures Levophed, wean as tolerated Solumedrol 60 mg q12hr x3 doses NS 150 cc/hr CBC/BMP in am  ENDOCRINE A:   No abnormalities P:   No interventions  NEUROLOGIC A:   Acute encephalopathy secondary to severe sepsis P:   RASS goal: 0    FAMILY  - Updates: None  - Inter-disciplinary family meet or Palliative Care meeting due by:  11/07/2015    Pulmonary and [place] Pager: [phone]  10/31/2015, 5:03 AM

## 2015-10-31 NOTE — ED Notes (Signed)
Called CT for ETA. 3 people in front of the patient.

## 2015-10-31 NOTE — ED Notes (Signed)
Attempted to in and out catheter patient. Catheter met blockage and was unable to get urine. Pt. Has history of enlarged prostate stated by son of pt.

## 2015-10-31 NOTE — ED Notes (Signed)
Patient returned from ct

## 2015-10-31 NOTE — ED Notes (Signed)
Reported to Dr. Claudine Mouton about pt. Having episode of SVT while lying in bed not moving. Pt. Self corrected is currently in SR. Dr. Claudine Mouton ordered Norepinephrine and instructed to not hang it yet, to left fluid bolus finish.

## 2015-10-31 NOTE — ED Notes (Signed)
intensivist at the bedside.  

## 2015-11-01 ENCOUNTER — Inpatient Hospital Stay (HOSPITAL_COMMUNITY): Payer: Medicare Other

## 2015-11-01 DIAGNOSIS — R6521 Severe sepsis with septic shock: Secondary | ICD-10-CM

## 2015-11-01 DIAGNOSIS — J690 Pneumonitis due to inhalation of food and vomit: Secondary | ICD-10-CM

## 2015-11-01 LAB — CBC
HCT: 31 % — ABNORMAL LOW (ref 39.0–52.0)
Hemoglobin: 10 g/dL — ABNORMAL LOW (ref 13.0–17.0)
MCH: 27.9 pg (ref 26.0–34.0)
MCHC: 32.3 g/dL (ref 30.0–36.0)
MCV: 86.4 fL (ref 78.0–100.0)
PLATELETS: 103 10*3/uL — AB (ref 150–400)
RBC: 3.59 MIL/uL — AB (ref 4.22–5.81)
RDW: 15 % (ref 11.5–15.5)
WBC: 9 10*3/uL (ref 4.0–10.5)

## 2015-11-01 LAB — URINE CULTURE: CULTURE: NO GROWTH

## 2015-11-01 LAB — BASIC METABOLIC PANEL
Anion gap: 10 (ref 5–15)
BUN: 19 mg/dL (ref 6–20)
CHLORIDE: 111 mmol/L (ref 101–111)
CO2: 20 mmol/L — AB (ref 22–32)
CREATININE: 0.98 mg/dL (ref 0.61–1.24)
Calcium: 8.1 mg/dL — ABNORMAL LOW (ref 8.9–10.3)
GFR calc Af Amer: >60 mL/min (ref >60)
GFR calc non Af Amer: >60 mL/min (ref >60)
GLUCOSE: 127 mg/dL — AB (ref 65–99)
Potassium: 4.3 mmol/L (ref 3.5–5.1)
Sodium: 141 mmol/L (ref 135–145)

## 2015-11-01 LAB — GLUCOSE, CAPILLARY
GLUCOSE-CAPILLARY: 92 mg/dL (ref 65–99)
Glucose-Capillary: 115 mg/dL — ABNORMAL HIGH (ref 65–99)
Glucose-Capillary: 118 mg/dL — ABNORMAL HIGH (ref 65–99)
Glucose-Capillary: 127 mg/dL — ABNORMAL HIGH (ref 65–99)
Glucose-Capillary: 132 mg/dL — ABNORMAL HIGH (ref 65–99)
Glucose-Capillary: 91 mg/dL (ref 65–99)

## 2015-11-01 LAB — TROPONIN I: Troponin I: 0.03 ng/mL (ref <0.031)

## 2015-11-01 MED ORDER — METHYLPREDNISOLONE SODIUM SUCC 40 MG IJ SOLR
30.0000 mg | Freq: Two times a day (BID) | INTRAMUSCULAR | Status: AC
  Administered 2015-11-01 – 2015-11-02 (×3): 30 mg via INTRAVENOUS
  Filled 2015-11-01 (×3): qty 1

## 2015-11-01 MED ORDER — SODIUM CHLORIDE 0.9 % IV SOLN
INTRAVENOUS | Status: DC
Start: 2015-11-01 — End: 2015-11-05
  Administered 2015-11-01 – 2015-11-04 (×5): via INTRAVENOUS

## 2015-11-01 NOTE — Evaluation (Signed)
Clinical/Bedside Swallow Evaluation Patient Details  Name: [name] MRN: [id number] Date of Birth: 01-Nov-1929  Today's Date: 11/01/2015 Time: SLP Start Time (ACUTE ONLY): [id number] SLP Stop Time (ACUTE ONLY): 1148 SLP Time Calculation (min) (ACUTE ONLY): 17 min  Past Medical History:  Past Medical History  Diagnosis Date  . COPD (chronic obstructive pulmonary disease) ([place])   . GERD (gastroesophageal reflux disease)   . Anxiety and depression   . Gout   . CVA (cerebral infarction) 2012    Left parietal ICH; "left him weaker on the right side" (03/13/2013)  . GIB (gastrointestinal bleeding) 04/12  . Diverticulosis   . Family history of anesthesia complication     "makes son nauseous" (03/13/2013)  . Hypertension   . Pneumonia     "several times; last time 12-18 months ago"  (03/13/2013)  . Exertional shortness of breath   . Arthritis     "neck; hands" (03/13/2013)  . Prostate cancer ([place])   . Stroke [facility])    Past Surgical History:  Past Surgical History  Procedure Laterality Date  . Prostate surgery    . Total knee arthroplasty Right   . Shoulder surgery Right 2003    decompression; "fell and broke it years ago; doesn't have plates or screws in there;" (03/13/2013)  . Carotid stent insertion Bilateral    HPI:  82 year old male with a past medical history of COPD not on home O2, CVA in 2012 with residual right sided weakness, gout, HTN presenting with shortness of breath. Son reports that for the past 2-3 days the patient has not been feeling well with non-specific complaints. Has not been eating or drinking well. Son reports patient has generalized leg weakness which he has had with PNA in the past. Chest CT scan reveals bilateral lobe pneumonia, right greater than left.  Concern for possible aspiration, hence swallow eval ordered.    Assessment / Plan / Recommendation Clinical Impression  Pt presents with functional swallow, with adequate mastication, brisk swallow response,  throat-clearing initially after consumption of thin liquids, but this ceased during progression of evaluation.  Respiratory/ventilatory coordination appears wfl.  Recommend resuming a regular diet, thin liquids.  Given concerns for recurrent aspiration, SLP will follow briefly to ensure absence of clinical symptoms during meals and + toleration, as well as to determine if MBS is warranted.      Aspiration Risk  Mild aspiration risk    Diet Recommendation   regular solids, thin liquids  Medication Administration: Whole meds with liquid    Other  Recommendations Oral Care Recommendations: Oral care BID   Follow up Recommendations       Frequency and Duration min 1 x/week  1 week       Prognosis Prognosis for Safe Diet Advancement: Good      Swallow Study   General Date of Onset: 10/30/15 HPI: 82 year old male with a past medical history of COPD not on home O2, CVA in 2012 with residual right sided weakness, gout, HTN presenting with shortness of breath. Son eports that for the past 2-3 days the patient has not been feeling well with non-specific complaints. Has not been eating or drinking well. Son reports patient has has generalized leg weakness which he has had with PNA in the past. Chest CT scan reveals bilateral lobe pneumonia, right greater than left.  Concern for possible aspiration, hence swallow eval ordered.  Type of Study: Bedside Swallow Evaluation Previous Swallow Assessment: 2012 after CVA Diet Prior to  this Study: NPO Temperature Spikes Noted: No Respiratory Status: Room air History of Recent Intubation: No Behavior/Cognition: Alert;Cooperative;Pleasant mood Oral Cavity Assessment: Within Functional Limits Oral Care Completed by SLP: No Oral Cavity - Dentition: Dentures, top;Dentures, bottom Vision: Functional for self-feeding Self-Feeding Abilities: Able to feed self Patient Positioning: Upright in bed Baseline Vocal Quality: Normal Volitional Cough:  Strong Volitional Swallow: Able to elicit    Oral/Motor/Sensory Function Overall Oral Motor/Sensory Function: Within functional limits   Ice Chips Ice chips: Within functional limits   Thin Liquid Thin Liquid: Within functional limits Presentation: Cup;Self Fed    Nectar Thick Nectar Thick Liquid: Not tested   Honey Thick Honey Thick Liquid: Not tested   Puree Puree: Within functional limits Presentation: Self Fed   Solid   GO   Solid: Within functional limits Presentation: Self Fed        [name] 11/01/2015,11:56 AM

## 2015-11-01 NOTE — CV Procedure (Signed)
Attempted  Echocardiogram, but the patient wanted to finish his lunch.   Darlina Sicilian RDCS, 11/01/15 1:30 PM

## 2015-11-01 NOTE — Progress Notes (Signed)
Bedside handoff communication to Woodroe Chen, Therapist, sports.

## 2015-11-01 NOTE — H&P (Signed)
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 Patient's son at bedside provided most of the history. Reports that for the past 2-3 days the patient has not been feeling well with non-specific complaints. Has not been eating or drinking well. Son reports patient has has generalized leg weakness which he has had with PNA in the past. Reported subjected fever 2 days ago and non-productive cough. Tonight, patient became short of breath tonight after exerting himself and was unable to catch his breath. Patient was febrile, hypotensive and hypoxic requiring 4L [place] to maintain O2 sats on arrival. Received 2.5 L NS in the ED and remained hypotensive. Started on norepinephrine with good response. CXR in the ED was read as negative for PNA but CT scan reveals bilateral lobe pneumonia, right greater than left. Given a dose of vancomycin and zosyn in the ED. Denies any chills, chest pain, cough, sputum production, nausea, vomiting, diarrhea, dysuria. No sick contacts.    SUBJECTIVE:  Pt feels better since coming in. HR improved. (-) subj complaints.  (-) fevers.   VITAL SIGNS: BP 103/61 mmHg  Pulse 46  Temp(Src) 97.6 F (36.4 C) (Oral)  Resp 22  Ht 6' (1.829 m)  Wt 160 lb 0.9 oz (72.6 kg)  BMI 21.70 kg/m2  SpO2 97%  HEMODYNAMICS:    VENTILATOR SETTINGS:    INTAKE / OUTPUT: I/O last 3 completed shifts: In: 4802.4 [I.V.:4502.4; IV Piggyback:300] Out: 1425 [Urine:1425]  PHYSICAL EXAMINATION: General: well developed, frail appearing elderly male in no acute distress Neuro: CN intact. Awake, oriented x 3.(-) lateralizing signs.   HEENT:  PERRLA, EMOI. Cardiovascular: RRR, no murmurs rubs or gallops Lungs: No increased work of breathing, bibasilar crackles Abdomen: soft, non-tender, non-distended, BS+ Skin:  Warm, dry  LABS:  BMET  Recent Labs Lab 10/30/15 2328 11/01/15 0015  NA 129* 141  K 4.8 4.3  CL 101 111  CO2 21* 20*  BUN 22* 19  CREATININE 1.23 0.98  GLUCOSE 129* 127*    Electrolytes  Recent Labs Lab 10/30/15 2328 11/01/15 0015  CALCIUM 8.8* 8.1*    CBC  Recent Labs Lab 10/30/15 2328 11/01/15 0015  WBC 10.2 9.0  HGB 11.5* 10.0*  HCT 36.0* 31.0*  PLT 141* 103*    Coag's No results for input(s): APTT, INR in the last 168 hours.  Sepsis Markers  Recent Labs Lab 10/30/15 2343 10/31/15 0159 10/31/15 0500  LATICACIDVEN 1.49 0.69 0.96    ABG  Recent Labs Lab 10/31/15 1059  PHART 7.310*  PCO2ART 40.4  PO2ART 93.0    Liver Enzymes  Recent Labs Lab 10/30/15 2328  AST 40  ALT 15*  ALKPHOS 59  BILITOT 1.2  ALBUMIN 3.3*    Cardiac Enzymes  Recent Labs Lab 10/31/15 1243 10/31/15 1808 11/01/15 0015  TROPONINI <0.03 <0.03 0.03    Glucose  Recent Labs Lab 10/31/15 0857 10/31/15 1219 10/31/15 1539 10/31/15 2011 10/31/15 2350 11/01/15 0348  GLUCAP 235* 170* 131* 102* 115* 118*    Imaging No results found. STUDIES:  10/30/2015 CXR: Chronic interstitial coarsening. No acute cardiopulmonary findings. 10/31/2015 CT  Chset: Dependent bilateral lower lobe ground-glass and confluent opacities, right greater than left. Pneumonia versus aspiration  CULTURES: 10/30/2015 BCx x2 > 4/7 influenza > (-) 4/7 MRSA (+)  ANTIBIOTICS: 10/30/2015 Vanc and Zosyn x1 10/31/2015 Ceftriaxone > 10/31/2015 Azithromycin >  SIGNIFICANT EVENTS:  LINES/TUBES: 10/30/2015 PIV left forearm, right antecubital  DISCUSSION: 82 year old male with a history of COPD presents with shortness of breath found to have bilateral pulmonary infiltrates with associated hypotension not  responsive to fluids and started on pressors  ASSESSMENT / PLAN:  PULMONARY A: Acute Hypoxia secondary to PNA, Bibasilar, concern for aspiration pna. Clinically better.  COPD P:   Continue home Symbicort BID > on dulera while admitted Duonebs q6hr prn Supplemental O2 keep sats 88-92% Steroids > decrease dose > switch to PO in am.   CARDIOVASCULAR A:  Hx of HTN Bradycardia P:  Check echo.  HR better  RENAL A:   Metabolic acidosis 2/2 sepsis. Lactate N. Better.  P:   Rx infxn. Cont IVF 75 mls/hr > dc IVF once eating  GASTROINTESTINAL A:   GI Ulcer PPx P:   Pepcid Swallow evaln. Possible asp pna. Pt with recurrent pna. (last pna was 06/2015)  HEMATOLOGIC A:   DVT PPx P:  Lovenox                                                                                                             INFECTIOUS A:   Severe CAP Septic Shock > clinically better P:   Ceftriaxone 2g and Azithromycin 500 mg Follow up blood cultures Levophed > weaned off NS 75 ml/hr> dc if eating  ENDOCRINE A:   No abnormalities P:   No interventions  NEUROLOGIC A:   Acute encephalopathy secondary to severe sepsis > better.  P:   RASS goal: 0 Check alcohol level, UDS  Pt clinically improved. Pt is full code. Transfer to telemetry. Pt to be seen by [facility] [name] on 4/9, pccm will sign off then.   - Inter-disciplinary family meet or Palliative Care meeting due by:  11/07/2015   J. Shirl Harris, MD 11/01/2015, 8:39 AM [place] [facility] Pager [phone] After 3 pm or if no answer, call [phone]

## 2015-11-02 DIAGNOSIS — F329 Major depressive disorder, single episode, unspecified: Secondary | ICD-10-CM

## 2015-11-02 DIAGNOSIS — K219 Gastro-esophageal reflux disease without esophagitis: Secondary | ICD-10-CM

## 2015-11-02 LAB — GLUCOSE, CAPILLARY
GLUCOSE-CAPILLARY: 133 mg/dL — AB (ref 65–99)
GLUCOSE-CAPILLARY: 106 mg/dL — AB (ref 65–99)
GLUCOSE-CAPILLARY: 104 mg/dL — AB (ref 65–99)
Glucose-Capillary: 108 mg/dL — ABNORMAL HIGH (ref 65–99)
Glucose-Capillary: 114 mg/dL — ABNORMAL HIGH (ref 65–99)
Glucose-Capillary: 128 mg/dL — ABNORMAL HIGH (ref 65–99)

## 2015-11-02 MED ORDER — AZITHROMYCIN 500 MG PO TABS
500.0000 mg | ORAL_TABLET | Freq: Every day | ORAL | Status: DC
  Administered 2015-11-03 – 2015-11-05 (×3): 500 mg via ORAL
  Filled 2015-11-02 (×3): qty 1

## 2015-11-02 NOTE — Progress Notes (Addendum)
[facility] PROGRESS NOTE  SYD SANCEN [id number] DOB: 1929/08/25 DOA: 10/30/2015 PCP: Curly Rim, MD  Assessment/Plan: Active Problems:   Septic shock ([place]) -addendum: continue current medical regimen    Aspiration pneumonia of both lower lobes ([place]) - Improving on current antibiotic regimen. - Most likely causes primary problem  GERD -Stable on Pepcid  Depression -Stable on Zoloft. We'll continue  COPD -Patient currently on low-dose steroid regimen. And tinea Dulera. - Continue DuoNeb  Code Status: full Family Communication: None at bedside Disposition Plan: Pending improvement in respiratory condition.   Consultants:  None  Procedures:  Pending  Antibiotics:  Azithromycin and ceftriaxone  HPI/Subjective: The patient has no new complaints today. No acute issues reported overnight  Objective: Filed Vitals:   11/02/15 0405 11/02/15 1445  BP: 144/66 129/61  Pulse: 59 60  Temp: 97.8 F (36.6 C) 97.8 F (36.6 C)  Resp: 16 18    Intake/Output Summary (Last 24 hours) at 11/02/15 1609 Last data filed at 11/02/15 1446  Gross per 24 hour  Intake 1306.67 ml  Output   2075 ml  Net -768.33 ml   Filed Weights   10/31/15 0816 11/01/15 0500 11/02/15 0605  Weight: 72.4 kg (159 lb 9.8 oz) 72.6 kg (160 lb 0.9 oz) 77.111 kg (170 lb)    Exam:   General:  Patient in no acute distress, alert and awake  Cardiovascular: Regular rate and rhythm, no rubs  Respiratory: Prolonged expiratory phase, equal chest rise, mild rales at right base  Abdomen: Soft, nondistended, no guarding  Musculoskeletal: No cyanosis  Data Reviewed: Basic Metabolic Panel:  Recent Labs Lab 10/30/15 2328 11/01/15 0015  NA 129* 141  K 4.8 4.3  CL 101 111  CO2 21* 20*  GLUCOSE 129* 127*  BUN 22* 19  CREATININE 1.23 0.98  CALCIUM 8.8* 8.1*   Liver Function Tests:  Recent Labs Lab 10/30/15 2328  AST 40  ALT 15*  ALKPHOS 59  BILITOT 1.2  PROT 6.5  ALBUMIN  3.3*   No results for input(s): LIPASE, AMYLASE in the last 168 hours. No results for input(s): AMMONIA in the last 168 hours. CBC:  Recent Labs Lab 10/30/15 2328 11/01/15 0015  WBC 10.2 9.0  NEUTROABS 9.3*  --   HGB 11.5* 10.0*  HCT 36.0* 31.0*  MCV 84.9 86.4  PLT 141* 103*   Cardiac Enzymes:  Recent Labs Lab 10/31/15 1243 10/31/15 1808 11/01/15 0015  TROPONINI <0.03 <0.03 0.03   BNP (last 3 results) No results for input(s): BNP in the last 8760 hours.  ProBNP (last 3 results) No results for input(s): PROBNP in the last 8760 hours.  CBG:  Recent Labs Lab 11/01/15 2031 11/01/15 2356 11/02/15 0402 11/02/15 0745 11/02/15 1157  GLUCAP 91 108* 128* 133* 114*    Recent Results (from the past 240 hour(s))  Culture, blood (Routine x 2)     Status: None (Preliminary result)   Collection Time: 10/30/15 11:28 PM  Result Value Ref Range Status   Specimen Description BLOOD LEFT FOREARM  Final   Special Requests BOTTLES DRAWN AEROBIC AND ANAEROBIC 5ML  Final   Culture NO GROWTH 2 DAYS  Final   Report Status PENDING  Incomplete  Culture, blood (Routine x 2)     Status: None (Preliminary result)   Collection Time: 10/30/15 11:32 PM  Result Value Ref Range Status   Specimen Description BLOOD RIGHT ARM  Final   Special Requests BOTTLES DRAWN AEROBIC AND ANAEROBIC 5ML  Final  Culture NO GROWTH 2 DAYS  Final   Report Status PENDING  Incomplete  Urine culture     Status: None   Collection Time: 10/31/15  1:01 AM  Result Value Ref Range Status   Specimen Description URINE, CATHETERIZED  Final   Special Requests NONE  Final   Culture NO GROWTH 1 DAY  Final   Report Status 11/01/2015 FINAL  Final  MRSA PCR Screening     Status: Abnormal   Collection Time: 10/31/15  8:40 AM  Result Value Ref Range Status   MRSA by PCR POSITIVE (A) NEGATIVE Final    Comment:        The GeneXpert MRSA Assay (FDA approved for NASAL specimens only), is one component of a comprehensive  MRSA colonization surveillance program. It is not intended to diagnose MRSA infection nor to guide or monitor treatment for MRSA infections. RESULT CALLED TO, READ BACK BY AND VERIFIED WITH: SMITH RN 12:10 10/31/15 ([email or web address])      Studies: No results found.  Scheduled Meds: . antiseptic oral rinse  7 mL Mouth Rinse BID  . [START ON 11/03/2015] azithromycin  500 mg Oral Daily  . cefTRIAXone (ROCEPHIN)  IV  2 g Intravenous Q24H  . Chlorhexidine Gluconate Cloth  6 each Topical Q0600  . enoxaparin (LOVENOX) injection  40 mg Subcutaneous Q24H  . famotidine  20 mg Oral BID  . insulin aspart  0-9 Units Subcutaneous 6 times per day  . methylPREDNISolone (SOLU-MEDROL) injection  30 mg Intravenous Q12H  . mometasone-formoterol  2 puff Inhalation BID  . mupirocin ointment  1 application Nasal BID  . sertraline  100 mg Oral BID   Continuous Infusions: . sodium chloride 50 mL/hr at 11/02/15 0615  . norepinephrine (LEVOPHED) Adult infusion Stopped (10/31/15 1100)    Time spent: > 35 minutes    Velvet Bathe  [facility] Pager [phone] If 7PM-7AM, please contact night-coverage at [email or web address], password [facility] 11/02/2015, 4:09 PM  LOS: 2 days

## 2015-11-03 ENCOUNTER — Encounter (HOSPITAL_COMMUNITY): Payer: Self-pay | Admitting: *Deleted

## 2015-11-03 ENCOUNTER — Inpatient Hospital Stay (HOSPITAL_COMMUNITY): Payer: Medicare Other

## 2015-11-03 DIAGNOSIS — R0600 Dyspnea, unspecified: Secondary | ICD-10-CM

## 2015-11-03 LAB — ECHOCARDIOGRAM COMPLETE
HEIGHTINCHES: 72 in
WEIGHTICAEL: 2465.6 [oz_av]

## 2015-11-03 LAB — GLUCOSE, CAPILLARY
GLUCOSE-CAPILLARY: 148 mg/dL — AB (ref 65–99)
GLUCOSE-CAPILLARY: 98 mg/dL (ref 65–99)
Glucose-Capillary: 111 mg/dL — ABNORMAL HIGH (ref 65–99)
Glucose-Capillary: 119 mg/dL — ABNORMAL HIGH (ref 65–99)
Glucose-Capillary: 85 mg/dL (ref 65–99)

## 2015-11-03 LAB — RESPIRATORY VIRUS PANEL
Adenovirus: NEGATIVE
INFLUENZA A: NEGATIVE
Influenza B: NEGATIVE
Metapneumovirus: NEGATIVE
PARAINFLUENZA 2 A: NEGATIVE
Parainfluenza 1: NEGATIVE
Parainfluenza 3: NEGATIVE
Respiratory Syncytial Virus A: NEGATIVE
Respiratory Syncytial Virus B: NEGATIVE
Rhinovirus: NEGATIVE

## 2015-11-03 LAB — CBC
HCT: 33 % — ABNORMAL LOW (ref 39.0–52.0)
Hemoglobin: 10.8 g/dL — ABNORMAL LOW (ref 13.0–17.0)
MCH: 27.6 pg (ref 26.0–34.0)
MCHC: 32.7 g/dL (ref 30.0–36.0)
MCV: 84.4 fL (ref 78.0–100.0)
PLATELETS: 141 10*3/uL — AB (ref 150–400)
RBC: 3.91 MIL/uL — AB (ref 4.22–5.81)
RDW: 15.1 % (ref 11.5–15.5)
WBC: 7.3 10*3/uL (ref 4.0–10.5)

## 2015-11-03 MED ORDER — CEFPODOXIME PROXETIL 200 MG PO TABS
200.0000 mg | ORAL_TABLET | Freq: Two times a day (BID) | ORAL | Status: DC
  Administered 2015-11-03 – 2015-11-05 (×5): 200 mg via ORAL
  Filled 2015-11-03 (×6): qty 1

## 2015-11-03 MED ORDER — RESOURCE THICKENUP CLEAR PO POWD
ORAL | Status: DC | PRN
  Filled 2015-11-03: qty 125

## 2015-11-03 NOTE — Progress Notes (Signed)
  Echocardiogram 2D Echocardiogram has been performed.  [name] 11/03/2015, 3:04 PM

## 2015-11-03 NOTE — Progress Notes (Signed)
MBSS complete. Full report located under chart review in imaging section. Natilie Krabbenhoft, MA CCC-SLP [phone]  

## 2015-11-03 NOTE — Care Management Note (Addendum)
Case Management Note  Patient Details  Name: DORSETT BIELEC MRN: [id number] Date of Birth: 15-May-1930  Subjective/Objective:             Admitted with sepsis. From home with son/family. Pt states independent with ADL's PTA. Owns cane, walker.  PCP:  Kip Corrington.   Action/Plan:  Awaiting PT evaluation ...... CM to f/u with dispositions needs.  Expected Discharge Date:                  Expected Discharge Plan:  [place]  In-House Referral:     Discharge planning Services  CM Consult  Post Acute Care Choice:    Choice offered to:   patient  DME Arranged:    DME Agency:     HH Arranged:    [place] Agency:     Status of Service:  In process, will continue to follow  Medicare Important Message Given:  Yes Date Medicare IM Given:    Medicare IM give by:    Date Additional Medicare IM Given:    Additional Medicare Important Message give by:     If discussed at [place] of Stay Meetings, dates discussed:    Additional Comments: CM spoke with pt regarding discharge planning. Pt awaiting PT eval., if recommendations for home health services are needed pt has agreed to use [facility].    Yomar, [place], [place] [phone] 11/03/2015, 2:09 PM

## 2015-11-03 NOTE — Progress Notes (Signed)
Speech Language Pathology Treatment: Dysphagia  Patient Details Name: AREL HENAGAN MRN: [id number] DOB: [birth year] Today's Date: 11/03/2015 Time: [id number] SLP Time Calculation (min) (ACUTE ONLY): 10 min  Assessment / Plan / Recommendation Clinical Impression  Pt observed to have persistent coughing and trhaot clearing during am meal. Pt reports a prior MBS found in 2012 following CVA with mild dysphagia. Concerning that pt is continuing to aspirate, function may have worsened. Recommend MBS for objective assessment, discussed with pt.    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 Son eports that for the past 2-3 days the patient has not been feeling well with non-specific complaints. Has not been eating or drinking well. Son reports patient has has generalized leg weakness which he has had with PNA in the past. Chest CT scan reveals bilateral lobe pneumonia, right greater than left.  Concern for possible aspiration, hence swallow eval ordered.       SLP Plan  MBS     Recommendations  Diet recommendations:  (continue srret diet until MBS)             Oral Care Recommendations: Oral care BID Follow up Recommendations: Outpatient SLP;Home health SLP Plan: MBS     GO                Janazia Schreier, Katherene Ponto 11/03/2015, 9:09 AM

## 2015-11-03 NOTE — Progress Notes (Signed)
[facility] PROGRESS NOTE  [name] [id number] DOB: [birth year] DOA: 10/30/2015 PCP: Curly Rim, MD  Assessment/Plan: Active Problems:   Septic shock ([place]) - sepsis physiology resolving    Aspiration pneumonia of both lower lobes ([place]) - will narrow antibiotic regimen to oral abx's - Most likely causes primary problem - obtain PT evaluation as patient seems debilitated  GERD -Stable on Pepcid  Depression -Stable on Zoloft. We'll continue  COPD -Patient currently on low-dose steroid regimen. And tinea Dulera. - Continue DuoNeb  Code Status: full Family Communication: None at bedside Disposition Plan: Pending PT recommendations.   Consultants:  None  Procedures:  Pending  Antibiotics:  Azithromycin and vantin  HPI/Subjective: The patient has no new complaints today. No acute issues reported overnight.  Objective: Filed Vitals:   11/03/15 0332 11/03/15 0508  BP:  156/75  Pulse: 60 54  Temp:  97.8 F (36.6 C)  Resp:  18    Intake/Output Summary (Last 24 hours) at 11/03/15 1343 Last data filed at 11/03/15 0923  Gross per 24 hour  Intake    480 ml  Output   1625 ml  Net  -1145 ml   Filed Weights   11/01/15 0500 11/02/15 0605 11/03/15 0536  Weight: 72.6 kg (160 lb 0.9 oz) 77.111 kg (170 lb) 69.899 kg (154 lb 1.6 oz)    Exam:   General:  Patient in no acute distress, alert and awake  Cardiovascular: Regular rate and rhythm, no rubs  Respiratory: Prolonged expiratory phase, equal chest rise, no wheezes  Abdomen: Soft, nondistended, no guarding  Musculoskeletal: No cyanosis  Data Reviewed: Basic Metabolic Panel:  Recent Labs Lab 10/30/15 2328 11/01/15 0015  NA 129* 141  K 4.8 4.3  CL 101 111  CO2 21* 20*  GLUCOSE 129* 127*  BUN 22* 19  CREATININE 1.23 0.98  CALCIUM 8.8* 8.1*   Liver Function Tests:  Recent Labs Lab 10/30/15 2328  AST 40  ALT 15*  ALKPHOS 59  BILITOT 1.2  PROT 6.5  ALBUMIN 3.3*   No  results for input(s): LIPASE, AMYLASE in the last 168 hours. No results for input(s): AMMONIA in the last 168 hours. CBC:  Recent Labs Lab 10/30/15 2328 11/01/15 0015 11/03/15 0534  WBC 10.2 9.0 7.3  NEUTROABS 9.3*  --   --   HGB 11.5* 10.0* 10.8*  HCT 36.0* 31.0* 33.0*  MCV 84.9 86.4 84.4  PLT 141* 103* 141*   Cardiac Enzymes:  Recent Labs Lab 10/31/15 1243 10/31/15 1808 11/01/15 0015  TROPONINI <0.03 <0.03 0.03   BNP (last 3 results) No results for input(s): BNP in the last 8760 hours.  ProBNP (last 3 results) No results for input(s): PROBNP in the last 8760 hours.  CBG:  Recent Labs Lab 11/02/15 2114 11/03/15 0012 11/03/15 0535 11/03/15 0800 11/03/15 1201  GLUCAP 104* 98 148* 111* 119*    Recent Results (from the past 240 hour(s))  Culture, blood (Routine x 2)     Status: None (Preliminary result)   Collection Time: 10/30/15 11:28 PM  Result Value Ref Range Status   Specimen Description BLOOD LEFT FOREARM  Final   Special Requests BOTTLES DRAWN AEROBIC AND ANAEROBIC 5ML  Final   Culture NO GROWTH 3 DAYS  Final   Report Status PENDING  Incomplete  Culture, blood (Routine x 2)     Status: None (Preliminary result)   Collection Time: 10/30/15 11:32 PM  Result Value Ref Range Status   Specimen Description BLOOD RIGHT ARM  Final   Special Requests BOTTLES DRAWN AEROBIC AND ANAEROBIC 5ML  Final   Culture NO GROWTH 3 DAYS  Final   Report Status PENDING  Incomplete  Urine culture     Status: None   Collection Time: 10/31/15  1:01 AM  Result Value Ref Range Status   Specimen Description URINE, CATHETERIZED  Final   Special Requests NONE  Final   Culture NO GROWTH 1 DAY  Final   Report Status 11/01/2015 FINAL  Final  MRSA PCR Screening     Status: Abnormal   Collection Time: 10/31/15  8:40 AM  Result Value Ref Range Status   MRSA by PCR POSITIVE (A) NEGATIVE Final    Comment:        The GeneXpert MRSA Assay (FDA approved for NASAL specimens only), is  one component of a comprehensive MRSA colonization surveillance program. It is not intended to diagnose MRSA infection nor to guide or monitor treatment for MRSA infections. RESULT CALLED TO, READ BACK BY AND VERIFIED WITH: SMITH RN 12:10 10/31/15 ([email or web address])      Studies: No results found.  Scheduled Meds: . antiseptic oral rinse  7 mL Mouth Rinse BID  . azithromycin  500 mg Oral Daily  . cefpodoxime  200 mg Oral Q12H  . Chlorhexidine Gluconate Cloth  6 each Topical Q0600  . enoxaparin (LOVENOX) injection  40 mg Subcutaneous Q24H  . famotidine  20 mg Oral BID  . insulin aspart  0-9 Units Subcutaneous 6 times per day  . mometasone-formoterol  2 puff Inhalation BID  . mupirocin ointment  1 application Nasal BID  . sertraline  100 mg Oral BID   Continuous Infusions: . sodium chloride 50 mL/hr at 11/03/15 0312  . norepinephrine (LEVOPHED) Adult infusion Stopped (10/31/15 1100)    Time spent: > 35 minutes   Velvet Bathe  [facility] Pager [phone] If 7PM-7AM, please contact night-coverage at [email or web address], password [facility] 11/03/2015, 1:43 PM  LOS: 3 days

## 2015-11-03 NOTE — Clinical Social Work Note (Signed)
Clinical Social Work Assessment  Patient Details  Name: [name] MRN: [id number] Date of Birth: 30-Mar-1930  Date of referral:  11/03/15               Reason for consult:  Facility Placement                Permission sought to share information with:  Facility Sport and exercise psychologist, Family Supports Permission granted to share information::  Yes, Verbal Permission Granted  Name::     [name].  Agency::  [facility] SNFs  Relationship::  Son  Contact Information:  [phone]  Housing/Transportation Living arrangements for the past 2 months:  [place] of Information:  Patient, Adult Children Patient Interpreter Needed:  None Criminal Activity/Legal Involvement Pertinent to Current Situation/Hospitalization:  No - Comment as needed Significant Relationships:  Adult Children Lives with:  Adult Children Do you feel safe going back to the place where you live?  No Need for family participation in patient care:  Yes (Comment)  Care giving concerns:  CSW received consult from patient's son regarding SNF placement. Patient lives with his son. Patient's son reports that he and his wife are experiencing caregiver burnout, but cannot afford to place patient in an ALF. Patient is unable to qualify for Medicaid due to income requirements.    Social Worker assessment / plan:  Physical Therapy is to see patient to determine physical needs.  Employment status:  Retired Nurse, adult PT Recommendations:  [place] / Referral to community resources:  [place]  Patient/Family's Response to care:  Patient's son states he has cared for his father for a while. Patient has been to SNF before such as [facility], which the son did not like. Patient has also been to [facility], which was close to patient's son. Patient's son is hopeful that patient can go to SNF at discharge.   Patient/Family's Understanding  of and Emotional Response to Diagnosis, Current Treatment, and Prognosis:  No questions/concerns.  Emotional Assessment Appearance:  Appears stated age Attitude/Demeanor/Rapport:   (Appropriate) Affect (typically observed):  Appropriate, Quiet Orientation:  Oriented to Self, Oriented to Place, Oriented to Situation Alcohol / Substance use:  Not Applicable Psych involvement (Current and /or in the community):  No (Comment)  Discharge Needs  Concerns to be addressed:  Care Coordination Readmission within the last 30 days:  No Current discharge risk:  None Barriers to Discharge:  Continued Medical Work up   [employer], [place] 11/03/2015, 4:57 PM

## 2015-11-03 NOTE — Care Management Important Message (Signed)
Important Message  Patient Details  Name: [name] MRN: [id number] Date of Birth: [birth year]   Medicare Important Message Given:  Yes    Georg, Fons, RN 11/03/2015, 8:07 AM

## 2015-11-04 LAB — GLUCOSE, CAPILLARY
GLUCOSE-CAPILLARY: 110 mg/dL — AB (ref 65–99)
GLUCOSE-CAPILLARY: 141 mg/dL — AB (ref 65–99)
GLUCOSE-CAPILLARY: 74 mg/dL (ref 65–99)
GLUCOSE-CAPILLARY: 97 mg/dL (ref 65–99)
GLUCOSE-CAPILLARY: 99 mg/dL (ref 65–99)
Glucose-Capillary: 128 mg/dL — ABNORMAL HIGH (ref 65–99)
Glucose-Capillary: 118 mg/dL — ABNORMAL HIGH (ref 65–99)
Glucose-Capillary: 158 mg/dL — ABNORMAL HIGH (ref 65–99)

## 2015-11-04 NOTE — Progress Notes (Signed)
Inpatient Rehabilitation  Patient was screened by Gerlean Ren for appropriateness for an Inpatient Acute Rehab consult.  At this time, we are not recommending Inpatient Rehab consult.  With pt's current diagnoses, it is very unlikely that pt's [facility] Medicare would authorize an IP Rehab admission. However, if medical team would like [place] to further pursue, we will be happy to do so.    [place] Admissions Coordinator Cell [phone] Office [phone]

## 2015-11-04 NOTE — Progress Notes (Signed)
[facility] PROGRESS NOTE  IRELAND VANSOMEREN [id number] DOB: [birth year] DOA: 10/30/2015 PCP: Curly Rim, MD  Assessment/Plan: Active Problems:   Septic shock ([place]) - sepsis physiology resolving    Aspiration pneumonia of both lower lobes ([place]) - will narrow antibiotic regimen to oral abx's - Most likely causes primary problem - Pt to undergo PT evaluation for disposition planning.  GERD -Stable on Pepcid  Depression -Stable on Zoloft. We'll continue  COPD -Patient currently on low-dose steroid regimen. And tinea Dulera. - Continue DuoNeb  Code Status: full Family Communication: None at bedside Disposition Plan: Pending PT recommendations. Most likely to rehab facility after d/c   Consultants:  None  Procedures:  Pending  Antibiotics:  Azithromycin and vantin  HPI/Subjective: The patient has no new complaints today. No acute issues reported overnight.  Objective: Filed Vitals:   11/03/15 2224 11/04/15 0430  BP: 118/59 151/65  Pulse: 59 60  Temp: 98.3 F (36.8 C) 98.7 F (37.1 C)  Resp: 16 16    Intake/Output Summary (Last 24 hours) at 11/04/15 1337 Last data filed at 11/04/15 1050  Gross per 24 hour  Intake 2849.16 ml  Output   3075 ml  Net -225.84 ml   Filed Weights   11/02/15 0605 11/03/15 0536 11/04/15 0425  Weight: 77.111 kg (170 lb) 69.899 kg (154 lb 1.6 oz) 69.4 kg (153 lb)    Exam:   General:  Patient in no acute distress, alert and awake  Cardiovascular: Regular rate and rhythm, no rubs  Respiratory: Prolonged expiratory phase, equal chest rise, no wheezes  Abdomen: Soft, nondistended, no guarding  Musculoskeletal: No cyanosis  Data Reviewed: Basic Metabolic Panel:  Recent Labs Lab 10/30/15 2328 11/01/15 0015  NA 129* 141  K 4.8 4.3  CL 101 111  CO2 21* 20*  GLUCOSE 129* 127*  BUN 22* 19  CREATININE 1.23 0.98  CALCIUM 8.8* 8.1*   Liver Function Tests:  Recent Labs Lab 10/30/15 2328  AST 40  ALT 15*   ALKPHOS 59  BILITOT 1.2  PROT 6.5  ALBUMIN 3.3*   No results for input(s): LIPASE, AMYLASE in the last 168 hours. No results for input(s): AMMONIA in the last 168 hours. CBC:  Recent Labs Lab 10/30/15 2328 11/01/15 0015 11/03/15 0534  WBC 10.2 9.0 7.3  NEUTROABS 9.3*  --   --   HGB 11.5* 10.0* 10.8*  HCT 36.0* 31.0* 33.0*  MCV 84.9 86.4 84.4  PLT 141* 103* 141*   Cardiac Enzymes:  Recent Labs Lab 10/31/15 1243 10/31/15 1808 11/01/15 0015  TROPONINI <0.03 <0.03 0.03   BNP (last 3 results) No results for input(s): BNP in the last 8760 hours.  ProBNP (last 3 results) No results for input(s): PROBNP in the last 8760 hours.  CBG:  Recent Labs Lab 11/04/15 0044 11/04/15 0423 11/04/15 0744 11/04/15 1204 11/04/15 1245  GLUCAP 97 99 110* 128* 118*    Recent Results (from the past 240 hour(s))  Culture, blood (Routine x 2)     Status: None (Preliminary result)   Collection Time: 10/30/15 11:28 PM  Result Value Ref Range Status   Specimen Description BLOOD LEFT FOREARM  Final   Special Requests BOTTLES DRAWN AEROBIC AND ANAEROBIC 5ML  Final   Culture NO GROWTH 3 DAYS  Final   Report Status PENDING  Incomplete  Culture, blood (Routine x 2)     Status: None (Preliminary result)   Collection Time: 10/30/15 11:32 PM  Result Value Ref Range Status   Specimen  Description BLOOD RIGHT ARM  Final   Special Requests BOTTLES DRAWN AEROBIC AND ANAEROBIC 5ML  Final   Culture NO GROWTH 3 DAYS  Final   Report Status PENDING  Incomplete  Urine culture     Status: None   Collection Time: 10/31/15  1:01 AM  Result Value Ref Range Status   Specimen Description URINE, CATHETERIZED  Final   Special Requests NONE  Final   Culture NO GROWTH 1 DAY  Final   Report Status 11/01/2015 FINAL  Final  MRSA PCR Screening     Status: Abnormal   Collection Time: 10/31/15  8:40 AM  Result Value Ref Range Status   MRSA by PCR POSITIVE (A) NEGATIVE Final    Comment:        The GeneXpert  MRSA Assay (FDA approved for NASAL specimens only), is one component of a comprehensive MRSA colonization surveillance program. It is not intended to diagnose MRSA infection nor to guide or monitor treatment for MRSA infections. RESULT CALLED TO, READ BACK BY AND VERIFIED WITH: SMITH RN 12:10 10/31/15 ([email or web address])   Respiratory virus panel     Status: None   Collection Time: 10/31/15 10:52 AM  Result Value Ref Range Status   Respiratory Syncytial Virus A Negative Negative Final   Respiratory Syncytial Virus B Negative Negative Final   Influenza A Negative Negative Final   Influenza B Negative Negative Final   Parainfluenza 1 Negative Negative Final   Parainfluenza 2 Negative Negative Final   Parainfluenza 3 Negative Negative Final   Metapneumovirus Negative Negative Final   Rhinovirus Negative Negative Final   Adenovirus Negative Negative Final    Comment: (NOTE) Performed At: [facility] [place], [place] [id number] Lindon Romp MD [id number]      Studies: Dg Swallowing Func-speech Pathology  11/03/2015  Objective Swallowing Evaluation: Type of Study: MBS-Modified Barium Swallow Study Patient Details Name: [name] MRN: [id number] Date of Birth: May 14, 1930 Today's Date: 11/03/2015 Time: SLP Start Time (ACUTE ONLY): 1100-SLP Stop Time (ACUTE ONLY): 1120 SLP Time Calculation (min) (ACUTE ONLY): 20 min Past Medical History: Past Medical History Diagnosis Date . COPD (chronic obstructive pulmonary disease) ([place])  . GERD (gastroesophageal reflux disease)  . Anxiety and depression  . Gout  . CVA (cerebral infarction) 2012   Left parietal ICH; "left him weaker on the right side" (03/13/2013) . GIB (gastrointestinal bleeding) 04/12 . Diverticulosis  . Family history of anesthesia complication    "makes son nauseous" (03/13/2013) . Hypertension  . Pneumonia    "several times; last time 12-18 months ago"  (03/13/2013) . Exertional shortness of breath  . Arthritis     "neck; hands" (03/13/2013) . Prostate cancer ([place])  . Stroke [facility])  Past Surgical History: Past Surgical History Procedure Laterality Date . Prostate surgery   . Total knee arthroplasty Right  . Shoulder surgery Right 2003   decompression; "fell and broke it years ago; doesn't have plates or screws in there;" (03/13/2013) . Carotid stent insertion Bilateral  HPI: 82 year old male with a past medical history of COPD not on home O2, CVA in 2012 with residual right sided weakness, gout, HTN presenting with shortness of breath. Son eports that for the past 2-3 days the patient has not been feeling well with non-specific complaints. Has not been eating or drinking well. Son reports patient has has generalized leg weakness which he has had with PNA in the past. Chest CT scan reveals bilateral lobe pneumonia, right greater than  left.  Concern for possible aspiration, hence swallow eval ordered.  Subjective: pleasant, talkative Assessment / Plan / Recommendation CHL IP CLINICAL IMPRESSIONS 11/03/2015 Therapy Diagnosis -- Clinical Impression Pt demonstrates moderate oropharyngeal sensory impairment leading to delayed initiation of airway protection intermittently, leading to frequent aspiration events with delayed sensation. Attempted reducing bolus size, a chin tuck and direction to orally hold bolus, without any consistent success. Nectar thick liquids, even with larger boluses or with rapid intake are tolerated without aspiration. Recommend pt initiate a dys 3 (mechanical soft) diet and nectar thick liquids. SLP will follow up to further discuss finding of probable chronic dysphagia with pt and decisions regarding PO intake.  Impact on safety and function Severe aspiration risk;Moderate aspiration risk   CHL IP TREATMENT RECOMMENDATION 11/03/2015 Treatment Recommendations Therapy as outlined in treatment plan below   Prognosis 11/03/2015 Prognosis for Safe Diet Advancement Fair Barriers to Reach Goals -- Barriers/Prognosis  Comment -- CHL IP DIET RECOMMENDATION 11/03/2015 SLP Diet Recommendations Dysphagia 3 (Mech soft) solids;Nectar thick liquid Liquid Administration via Cup;Straw Medication Administration Whole meds with liquid Compensations -- Postural Changes Seated upright at 90 degrees   CHL IP OTHER RECOMMENDATIONS 11/03/2015 Recommended Consults -- Oral Care Recommendations Oral care BID Other Recommendations Order thickener from pharmacy   CHL IP FOLLOW UP RECOMMENDATIONS 11/03/2015 Follow up Recommendations Outpatient SLP;Home health SLP   CHL IP FREQUENCY AND DURATION 11/03/2015 Speech Therapy Frequency (ACUTE ONLY) min 2x/week Treatment Duration 2 weeks      CHL IP ORAL PHASE 11/03/2015 Oral Phase WFL Oral - Pudding Teaspoon -- Oral - Pudding Cup -- Oral - Honey Teaspoon -- Oral - Honey Cup -- Oral - Nectar Teaspoon -- Oral - Nectar Cup -- Oral - Nectar Straw -- Oral - Thin Teaspoon -- Oral - Thin Cup -- Oral - Thin Straw -- Oral - Puree -- Oral - Mech Soft -- Oral - Regular -- Oral - Multi-Consistency -- Oral - Pill -- Oral Phase - Comment --  CHL IP PHARYNGEAL PHASE 11/03/2015 Pharyngeal Phase Impaired Pharyngeal- Pudding Teaspoon -- Pharyngeal -- Pharyngeal- Pudding Cup -- Pharyngeal -- Pharyngeal- Honey Teaspoon -- Pharyngeal -- Pharyngeal- Honey Cup -- Pharyngeal -- Pharyngeal- Nectar Teaspoon -- Pharyngeal -- Pharyngeal- Nectar Cup Delayed swallow initiation-vallecula;Delayed swallow initiation-pyriform sinuses;Pharyngeal residue - valleculae Pharyngeal -- Pharyngeal- Nectar Straw Delayed swallow initiation-vallecula;Delayed swallow initiation-pyriform sinuses Pharyngeal -- Pharyngeal- Thin Teaspoon -- Pharyngeal -- Pharyngeal- Thin Cup Delayed swallow initiation-pyriform sinuses;Penetration/Aspiration before swallow;Moderate aspiration Pharyngeal Material enters airway, passes BELOW cords and not ejected out despite cough attempt by patient;Material enters airway, passes BELOW cords then ejected out;Material does not enter  airway Pharyngeal- Thin Straw Delayed swallow initiation-pyriform sinuses;Penetration/Aspiration before swallow;Moderate aspiration Pharyngeal Material enters airway, passes BELOW cords and not ejected out despite cough attempt by patient Pharyngeal- Puree Delayed swallow initiation-vallecula Pharyngeal -- Pharyngeal- Mechanical Soft -- Pharyngeal -- Pharyngeal- Regular Delayed swallow initiation-vallecula Pharyngeal -- Pharyngeal- Multi-consistency -- Pharyngeal -- Pharyngeal- Pill -- Pharyngeal -- Pharyngeal Comment --  No flowsheet data found. No flowsheet data found. Herbie [place], [place] CCC-SLP [phone] [place], Katherene Ponto 11/03/2015, 1:44 PM               Scheduled Meds: . antiseptic oral rinse  7 mL Mouth Rinse BID  . azithromycin  500 mg Oral Daily  . cefpodoxime  200 mg Oral Q12H  . Chlorhexidine Gluconate Cloth  6 each Topical Q0600  . enoxaparin (LOVENOX) injection  40 mg Subcutaneous Q24H  . famotidine  20 mg Oral BID  .  insulin aspart  0-9 Units Subcutaneous 6 times per day  . mometasone-formoterol  2 puff Inhalation BID  . mupirocin ointment  1 application Nasal BID  . sertraline  100 mg Oral BID   Continuous Infusions: . sodium chloride 50 mL/hr at 11/04/15 0235    Time spent: > 35 minutes   Velvet Bathe  [facility] Pager [phone] If 7PM-7AM, please contact night-coverage at [email or web address], password [facility] 11/04/2015, 1:37 PM  LOS: 4 days

## 2015-11-04 NOTE — Evaluation (Signed)
Physical Therapy Evaluation Patient Details Name: [name] MRN: [id number] DOB: 1930/06/15 Today's Date: 11/04/2015   History of Present Illness  Pt adm with septic shock and aspiration PNA. PMH -lt CVA, HTN, Gout, depression, COPD  Clinical Impression  Pt admitted with above diagnosis and presents to PT with functional limitations due to deficits listed below (See PT problem list). Pt needs skilled PT to maximize independence and safety to allow discharge to CIR prior to return home. If pt not accepted to CIR then would recommend ST-SNF. Pt's family familiar with various venues of rehab since pt has been to CIR as well as SNF previously. Family strongly prefers CIR. Explained to them that average stay on CIR is much shorter than SNF.     Follow Up Recommendations CIR    Equipment Recommendations  None recommended by PT    Recommendations for Other Services OT consult;Rehab consult     Precautions / Restrictions Precautions Precautions: Fall Restrictions Weight Bearing Restrictions: No      Mobility  Bed Mobility Overal bed mobility: Needs Assistance Bed Mobility: Supine to Sit     Supine to sit: Min assist     General bed mobility comments: Assist to bring legs over, elevate trunk into sitting, and bring hips to EOB  Transfers Overall transfer level: Needs assistance Equipment used: Rolling walker (2 wheeled) Transfers: Sit to/from Stand Sit to Stand: Min assist;+2 safety/equipment         General transfer comment: Assist to bring hips up and for balance  Ambulation/Gait Ambulation/Gait assistance: Min assist;+2 safety/equipment Ambulation Distance (Feet): 25 Feet Assistive device: Rolling walker (2 wheeled) Gait Pattern/deviations: Step-through pattern;Decreased step length - right;Decreased step length - left;Shuffle;Trunk flexed Gait velocity: decr Gait velocity interpretation: <1.8 ft/sec, indicative of risk for recurrent falls General Gait Details:  Assist for balance and support. Fatigues quickly with dyspnea 3/4 on RA.  Stairs            Wheelchair Mobility    Modified Rankin (Stroke Patients Only)       Balance Overall balance assessment: Needs assistance Sitting-balance support: No upper extremity supported;Feet supported Sitting balance-Leahy Scale: Fair     Standing balance support: Bilateral upper extremity supported Standing balance-Leahy Scale: Poor Standing balance comment: support of walker and min A for static standing                             Pertinent Vitals/Pain Pain Assessment: No/denies pain    Home Living Family/patient expects to be discharged to:: Private residence Living Arrangements: Children Available Help at Discharge: Family;Available 24 hours/day Type of Home: House Home Access: Stairs to enter Entrance Stairs-Rails: Right Entrance Stairs-Number of Steps: 3-4 Home Layout: One level Home Equipment: Walker - 4 wheels;Cane - single point;Shower seat      Prior Function Level of Independence: Independent         Comments: Has not been using any assistive device     Hand Dominance   Dominant Hand: Right    Extremity/Trunk Assessment   Upper Extremity Assessment: Generalized weakness           Lower Extremity Assessment: Generalized weakness         Communication   Communication: No difficulties  Cognition Arousal/Alertness: Awake/alert Behavior During Therapy: WFL for tasks assessed/performed Overall Cognitive Status: Within Functional Limits for tasks assessed  General Comments      Exercises        Assessment/Plan    PT Assessment Patient needs continued PT services  PT Diagnosis Difficulty walking;Generalized weakness   PT Problem List Decreased strength;Decreased activity tolerance;Decreased balance;Decreased mobility;Decreased knowledge of use of DME  PT Treatment Interventions DME instruction;Gait  training;Therapeutic activities;Functional mobility training;Therapeutic exercise;Balance training;Patient/family education   PT Goals (Current goals can be found in the Care Plan section) Acute Rehab PT Goals Patient Stated Goal: Return home PT Goal Formulation: With patient/family Time For Goal Achievement: 11/11/15 Potential to Achieve Goals: Good    Frequency Min 3X/week   Barriers to discharge        Co-evaluation               End of Session Equipment Utilized During Treatment: Gait belt Activity Tolerance: Patient limited by fatigue Patient left: in chair;with call bell/phone within reach;with chair alarm set;with family/visitor present           Time: [id number] PT Time Calculation (min) (ACUTE ONLY): 22 min   Charges:   PT Evaluation $PT Eval Moderate Complexity: 1 Procedure     PT G Codes:        Lanesha Azzaro 11-30-2015, 12:18 PM [facility] PT [phone]

## 2015-11-04 NOTE — Progress Notes (Signed)
UR COMPLETED  

## 2015-11-04 NOTE — NC FL2 (Signed)
[place] LEVEL OF CARE SCREENING TOOL     IDENTIFICATION  Patient Name: [name] Birthdate: Jul 17, 1930 Sex: male Admission Date (Current Location): 10/30/2015  [facility] and [place] Number:  Herbalist and Address:  [facility], [place] [address] [place], [place] [address]      Provider Number: [id number]  Attending Physician Name and Address:  Velvet Bathe, MD  Relative Name and Phone Number:       Current Level of Care: Hospital Recommended Level of Care: [place] Prior Approval Number:    Date Approved/Denied:   PASRR Number:    Discharge Plan: SNF    Current Diagnoses: Patient Active Problem List   Diagnosis Date Noted  . Septic shock ([place]) 10/31/2015  . Sepsis ([place])   . CAP (community acquired pneumonia)   . Centrilobular emphysema ([place])   . Aspiration pneumonia of both lower lobes ([place])   . Benign paroxysmal positional vertigo 06/29/2013  . Orthostasis 06/28/2013  . Dizziness 06/28/2013  . Renal cyst 10/29/2010  . Hx of adenomatous colonic polyps 10/29/2010  . Diverticulosis 10/29/2010  . Cerebral aneurysm 10/29/2010  . 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 Carotid stenosis 10/29/2010  . Prostate cancer ([place]) 10/29/2010  . GOUT, UNSPECIFIED 09/01/2010  . DEPRESSION 09/01/2010  . COPD with emphysema Gold B 04/05/2007  . GERD 04/05/2007    Orientation RESPIRATION BLADDER Height & Weight     Self, Situation, Time, Place  Normal Continent Weight: 153 lb (69.4 kg) Height:  6' (182.9 cm)  BEHAVIORAL SYMPTOMS/MOOD NEUROLOGICAL BOWEL NUTRITION STATUS      Continent Diet (cardiac, thickened liquids)  AMBULATORY STATUS COMMUNICATION OF NEEDS Skin   Limited Assist Verbally Normal                       Personal Care Assistance Level of Assistance  Bathing, Dressing Bathing Assistance: Limited assistance   Dressing Assistance: Limited assistance     Functional Limitations Info              SPECIAL CARE FACTORS FREQUENCY  PT (By licensed PT), OT (By licensed OT)     PT Frequency: 5/wk OT Frequency: 5/wk            Contractures      Additional Factors Info  Code Status, Isolation Precautions, Allergies, Insulin Sliding Scale Code Status Info: FULL Allergies Info: Contrast Media   Insulin Sliding Scale Info: 6/wk Isolation Precautions Info: MRSA     Current Medications (11/04/2015):  This is the current hospital active medication list Current Facility-Administered Medications  Medication Dose Route Frequency Provider Last Rate Last Dose  . 0.9 %  sodium chloride infusion   Intravenous Continuous Jose Shirl Harris, MD 50 mL/hr at 11/04/15 0235    . acetaminophen (TYLENOL) tablet 650 mg  650 mg Oral Q4H PRN Maryellen Pile, MD      . antiseptic oral rinse (CPC / CETYLPYRIDINIUM CHLORIDE 0.05%) solution 7 mL  7 mL Mouth Rinse BID Jose Shirl Harris, MD   7 mL at 11/04/15 0952  . azithromycin (ZITHROMAX) tablet 500 mg  500 mg Oral Daily Velvet Bathe, MD   500 mg at 11/04/15 0951  . cefpodoxime (VANTIN) tablet 200 mg  200 mg Oral Q12H Velvet Bathe, MD   200 mg at 11/04/15 0951  . Chlorhexidine Gluconate Cloth 2 % PADS 6 each  6 each Topical Q0600 Jose Angelo A de  Dios, MD   6 each at 11/04/15 0434  . enoxaparin (LOVENOX) injection 40 mg  40 mg Subcutaneous Q24H Maryellen Pile, MD   40 mg at 11/04/15 0950  . famotidine (PEPCID) tablet 20 mg  20 mg Oral BID Maryellen Pile, MD   20 mg at 11/04/15 0951  . insulin aspart (novoLOG) injection 0-9 Units  0-9 Units Subcutaneous 6 times per day Bethena Roys, MD   1 Units at 11/03/15 0400  . ipratropium-albuterol (DUONEB) 0.5-2.5 (3) MG/3ML nebulizer solution 3 mL  3 mL Nebulization Q6H PRN Maryellen Pile, MD      . mometasone-formoterol (DULERA) 100-5 MCG/ACT inhaler 2 puff  2 puff Inhalation BID Maryellen Pile, MD   2 puff at 11/04/15 1054  . mupirocin ointment (BACTROBAN) 2 % 1 application  1 application  Nasal BID Jose Shirl Harris, MD   1 application at [id number] [phone]  . [place]   Oral PRN Velvet Bathe, MD      . sertraline (ZOLOFT) tablet 100 mg  100 mg Oral BID Maryellen Pile, MD   100 mg at 11/04/15 [id number]     Discharge Medications: Please see discharge summary for a list of discharge medications.  Relevant Imaging Results:  Relevant Lab Results:   Additional Information SS#: [id number]  Cranford Mon,

## 2015-11-04 NOTE — Progress Notes (Signed)
Speech Language Pathology Treatment: Dysphagia  Patient Details Name: [name] MRN: [id number] DOB: May 03, 1930 Today's Date: 11/04/2015 Time: 1010-1040 SLP Time Calculation (min) (ACUTE ONLY): 30 min  Assessment / Plan / Recommendation Clinical Impression  Pt seen with family at bedside SLP provided verbal instruction and demonstration of thickening. Explained rationale. Pt and family accept plan to thicken liquids. Observed pt with nectar thick liquids. No coughing noted, but pt was observed to have some throat clearing. Encouraged more forceful throat clear or cough. Pt could benefit from RMT to improve ability to expectorate. SLP will f/u for further education as needed.    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 Son eports that for the past 2-3 days the patient has not been feeling well with non-specific complaints. Has not been eating or drinking well. Son reports patient has has generalized leg weakness which he has had with PNA in the past. Chest CT scan reveals bilateral lobe pneumonia, right greater than left.  Concern for possible aspiration, hence swallow eval ordered.       SLP Plan  Continue with current plan of care     Recommendations  Diet recommendations: Dysphagia 3 (mechanical soft);Nectar-thick liquid Liquids provided via: Cup;Straw Medication Administration: Whole meds with puree Supervision: Patient able to self feed             General recommendations: Rehab consult Oral Care Recommendations: Oral care BID Follow up Recommendations: Inpatient Rehab Plan: Continue with current plan of care     GO               [facility], [place] CCC-SLP [id number]  Lynann Beaver 11/04/2015, 10:49 AM

## 2015-11-05 LAB — GLUCOSE, CAPILLARY
GLUCOSE-CAPILLARY: 99 mg/dL (ref 65–99)
GLUCOSE-CAPILLARY: 98 mg/dL (ref 65–99)
Glucose-Capillary: 96 mg/dL (ref 65–99)

## 2015-11-05 LAB — CULTURE, BLOOD (ROUTINE X 2)
CULTURE: NO GROWTH
Culture: NO GROWTH

## 2015-11-05 MED ORDER — CEFPODOXIME PROXETIL 200 MG PO TABS: 200.0000 mg | ORAL_TABLET | Freq: Two times a day (BID) | ORAL | Status: AC

## 2015-11-05 NOTE — Discharge Summary (Signed)
Physician Discharge Summary  [name] [id number] DOB: October 24, 1929 DOA: 10/30/2015  PCP: Curly Rim, MD  Admit date: 10/30/2015 Discharge date: 11/05/2015  Time spent: > 35 minutes  Recommendations for Outpatient Follow-up:  1.  Please monitor respiratory condition. 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 After further evaluation was diagnosed with aspiration pneumonia.  Hospital Course:  Aspiration pneumonia - Was treated with IV antibiotics initially and will be discharged on oral antibiotic regimen for 5 more days to complete a 12 day treatment regimen. Patient was placed on azithromycin and third generation cephalosporin initially. An improved on this regimen - Diet was adjusted to dysphagia 3 diet - Continue maintenance medication regimen for COPD.  For other known medical conditions listed above will continue medication regimen listed below.  Procedures:  None  Consultations:  None  Discharge Exam: Filed Vitals:   11/04/15 2213 11/05/15 0458  BP: 128/59 145/68  Pulse: 56 52  Temp: 97.7 F (36.5 C) 98.1 F (36.7 C)  Resp: 17 17    General: Patient in no acute distress, alert and awake Cardiovascular: S1 and S2 within normal limits no rubs Respiratory: Mild expiratory wheeze left lung field. Equal chest rise  Discharge Instructions   Discharge Instructions    Call MD for:  severe  uncontrolled pain    Complete by:  As directed      Call MD for:  temperature >100.4    Complete by:  As directed      Diet - low sodium heart healthy    Complete by:  As directed      Discharge instructions    Complete by:  As directed   Please be sure to follow up with the primary care physician at the facility     Increase activity slowly    Complete by:  As directed           Current Discharge Medication List    START taking these medications   Details  cefpodoxime (VANTIN) 200 MG tablet Take 1 tablet (200 mg total) by mouth every 12 (twelve) hours. Qty: 10 tablet, Refills: 0      CONTINUE these medications which have NOT CHANGED   Details  albuterol (PROVENTIL HFA;VENTOLIN HFA) 108 (90 BASE) MCG/ACT inhaler Inhale 2 puffs into the lungs as needed for wheezing.    budesonide-formoterol (SYMBICORT) 80-4.5 MCG/ACT inhaler Inhale 2 puffs into the lungs 2 (two) times daily.    Cholecalciferol (VITAMIN D PO) Take 1,000 mg by mouth daily.     finasteride (PROSCAR) 5 MG tablet Take 5 mg by mouth daily.    mirtazapine (REMERON) 30 MG tablet Take 30 mg by mouth at bedtime.    omeprazole (PRILOSEC) 20 MG capsule Take 20 mg by mouth daily.      sertraline (ZOLOFT) 100 MG tablet Take 100 mg by mouth 2 (two) times daily.  Allergies  Allergen Reactions  . Contrast Media [Iodinated Diagnostic Agents] Hives, Shortness Of Breath, Swelling and Rash      The results of significant diagnostics from this hospitalization (including imaging, microbiology, ancillary and laboratory) are listed below for reference.    Significant Diagnostic Studies: Ct Chest Wo Contrast  10/31/2015  CLINICAL DATA:  Fever, hypoxia and hypotension. Concern for pneumonia. EXAM: CT CHEST WITHOUT CONTRAST TECHNIQUE: Multidetector CT imaging of the chest was performed following the standard protocol without IV contrast. COMPARISON:  Radiographs yesterday.  Most recent chest CT 12/06/2008 FINDINGS:  Mediastinum/Lymph Nodes: Normal caliber thoracic aorta with moderate atherosclerosis. Heart size is normal. Coronary artery calcifications are seen. No pericardial effusion. Small mediastinal lymph nodes. Limited assessment for hilar adenopathy given lack contrast, there is fullness in the left greater than right hilar region. Lungs/Pleura: Moderate to advanced emphysema. Central bronchial thickening, most prominent in the lower lobes. Ground-glass and confluent opacity in the dependent right lower lobe. Findings to a lesser extent in the left lower lobe. Left hilar fullness is suboptimally assessed without contrast. No pleural effusion. Bilateral upper lobe scarring. Upper abdomen: Hepatic cyst is again seen. No adrenal nodule. No acute abnormality in the upper abdomen. Musculoskeletal: There are no acute or suspicious osseous abnormalities. IMPRESSION: 1. Dependent bilateral lower lobe ground-glass and confluent opacities, right greater than left. Pneumonia versus aspiration. As these findings are not well assessed radiographically, recommend follow-up chest CT in 3-4 weeks after course of treatment to ensure resolution and exclude underlying neoplasm, particularly given the underlying emphysema. 2. Left hilar soft tissue fullness, suboptimally assessed without contrast. Recommend follow-up exam be performed with IV contrast to evaluate left hilar structures. Electronically Signed   By: Jeb Levering M.D.   On: 10/31/2015 03:18   Dg Chest Port 1 View  10/30/2015  CLINICAL DATA:  Sepsis EXAM: PORTABLE CHEST 1 VIEW COMPARISON:  06/06/2015 FINDINGS: Chronic interstitial coarsening is again evident. No alveolar consolidation. No large effusion. Normal pulmonary vasculature. Hilar, mediastinal and cardiac contours are unremarkable and unchanged. IMPRESSION: Chronic interstitial coarsening.  No acute cardiopulmonary findings. Electronically Signed   By: Andreas Newport M.D.   On: 10/30/2015 23:30   Dg  Swallowing Func-speech Pathology  11/03/2015  Objective Swallowing Evaluation: Type of Study: MBS-Modified Barium Swallow Study Patient Details Name: [name] MRN: [id number] Date of Birth: 10-31-1929 Today's Date: 11/03/2015 Time: SLP Start Time (ACUTE ONLY): 1100-SLP Stop Time (ACUTE ONLY): 1120 SLP Time Calculation (min) (ACUTE ONLY): 20 min Past Medical History: Past Medical History Diagnosis Date . COPD (chronic obstructive pulmonary disease) ([place])  . GERD (gastroesophageal reflux disease)  . Anxiety and depression  . Gout  . CVA (cerebral infarction) 2012   Left parietal ICH; "left him weaker on the right side" (03/13/2013) . GIB (gastrointestinal bleeding) 04/12 . Diverticulosis  . Family history of anesthesia complication    "makes son nauseous" (03/13/2013) . Hypertension  . Pneumonia    "several times; last time 12-18 months ago"  (03/13/2013) . Exertional shortness of breath  . Arthritis    "neck; hands" (03/13/2013) . Prostate cancer ([place])  . Stroke [facility])  Past Surgical History: Past Surgical History Procedure Laterality Date . Prostate surgery   . Total knee arthroplasty Right  . Shoulder surgery Right 2003   decompression; "fell and broke it years ago; doesn't have plates or screws in there;" (03/13/2013) . Carotid stent insertion Bilateral  HPI: 82 year old male with a past medical history of COPD not on home O2,  CVA in 2012 with residual right sided weakness, gout, HTN presenting with shortness of breath. Son eports that for the past 2-3 days the patient has not been feeling well with non-specific complaints. Has not been eating or drinking well. Son reports patient has has generalized leg weakness which he has had with PNA in the past. Chest CT scan reveals bilateral lobe pneumonia, right greater than left.  Concern for possible aspiration, hence swallow eval ordered.  Subjective: pleasant, talkative Assessment / Plan / Recommendation CHL IP CLINICAL IMPRESSIONS 11/03/2015 Therapy Diagnosis -- Clinical  Impression Pt demonstrates moderate oropharyngeal sensory impairment leading to delayed initiation of airway protection intermittently, leading to frequent aspiration events with delayed sensation. Attempted reducing bolus size, a chin tuck and direction to orally hold bolus, without any consistent success. Nectar thick liquids, even with larger boluses or with rapid intake are tolerated without aspiration. Recommend pt initiate a dys 3 (mechanical soft) diet and nectar thick liquids. SLP will follow up to further discuss finding of probable chronic dysphagia with pt and decisions regarding PO intake.  Impact on safety and function Severe aspiration risk;Moderate aspiration risk   CHL IP TREATMENT RECOMMENDATION 11/03/2015 Treatment Recommendations Therapy as outlined in treatment plan below   Prognosis 11/03/2015 Prognosis for Safe Diet Advancement Fair Barriers to Reach Goals -- Barriers/Prognosis Comment -- CHL IP DIET RECOMMENDATION 11/03/2015 SLP Diet Recommendations Dysphagia 3 (Mech soft) solids;Nectar thick liquid Liquid Administration via Cup;Straw Medication Administration Whole meds with liquid Compensations -- Postural Changes Seated upright at 90 degrees   CHL IP OTHER RECOMMENDATIONS 11/03/2015 Recommended Consults -- Oral Care Recommendations Oral care BID Other Recommendations Order thickener from pharmacy   CHL IP FOLLOW UP RECOMMENDATIONS 11/03/2015 Follow up Recommendations Outpatient SLP;Home health SLP   CHL IP FREQUENCY AND DURATION 11/03/2015 Speech Therapy Frequency (ACUTE ONLY) min 2x/week Treatment Duration 2 weeks      CHL IP ORAL PHASE 11/03/2015 Oral Phase WFL Oral - Pudding Teaspoon -- Oral - Pudding Cup -- Oral - Honey Teaspoon -- Oral - Honey Cup -- Oral - Nectar Teaspoon -- Oral - Nectar Cup -- Oral - Nectar Straw -- Oral - Thin Teaspoon -- Oral - Thin Cup -- Oral - Thin Straw -- Oral - Puree -- Oral - Mech Soft -- Oral - Regular -- Oral - Multi-Consistency -- Oral - Pill -- Oral Phase -  Comment --  CHL IP PHARYNGEAL PHASE 11/03/2015 Pharyngeal Phase Impaired Pharyngeal- Pudding Teaspoon -- Pharyngeal -- Pharyngeal- Pudding Cup -- Pharyngeal -- Pharyngeal- Honey Teaspoon -- Pharyngeal -- Pharyngeal- Honey Cup -- Pharyngeal -- Pharyngeal- Nectar Teaspoon -- Pharyngeal -- Pharyngeal- Nectar Cup Delayed swallow initiation-vallecula;Delayed swallow initiation-pyriform sinuses;Pharyngeal residue - valleculae Pharyngeal -- Pharyngeal- Nectar Straw Delayed swallow initiation-vallecula;Delayed swallow initiation-pyriform sinuses Pharyngeal -- Pharyngeal- Thin Teaspoon -- Pharyngeal -- Pharyngeal- Thin Cup Delayed swallow initiation-pyriform sinuses;Penetration/Aspiration before swallow;Moderate aspiration Pharyngeal Material enters airway, passes BELOW cords and not ejected out despite cough attempt by patient;Material enters airway, passes BELOW cords then ejected out;Material does not enter airway Pharyngeal- Thin Straw Delayed swallow initiation-pyriform sinuses;Penetration/Aspiration before swallow;Moderate aspiration Pharyngeal Material enters airway, passes BELOW cords and not ejected out despite cough attempt by patient Pharyngeal- Puree Delayed swallow initiation-vallecula Pharyngeal -- Pharyngeal- Mechanical Soft -- Pharyngeal -- Pharyngeal- Regular Delayed swallow initiation-vallecula Pharyngeal -- Pharyngeal- Multi-consistency -- Pharyngeal -- Pharyngeal- Pill -- Pharyngeal -- Pharyngeal Comment --  No flowsheet data found. No flowsheet data found. Herbie Baltimore, [place] CCC-SLP [phone] Lynann Beaver 11/03/2015, 1:44 PM  Microbiology: Recent Results (from the past 240 hour(s))  Culture, blood (Routine x 2)     Status: None   Collection Time: 10/30/15 11:28 PM  Result Value Ref Range Status   Specimen Description BLOOD LEFT FOREARM  Final   Special Requests BOTTLES DRAWN AEROBIC AND ANAEROBIC 5ML  Final   Culture NO GROWTH 5 DAYS  Final   Report Status 11/05/2015  FINAL  Final  Culture, blood (Routine x 2)     Status: None   Collection Time: 10/30/15 11:32 PM  Result Value Ref Range Status   Specimen Description BLOOD RIGHT ARM  Final   Special Requests BOTTLES DRAWN AEROBIC AND ANAEROBIC 5ML  Final   Culture NO GROWTH 5 DAYS  Final   Report Status 11/05/2015 FINAL  Final  Urine culture     Status: None   Collection Time: 10/31/15  1:01 AM  Result Value Ref Range Status   Specimen Description URINE, CATHETERIZED  Final   Special Requests NONE  Final   Culture NO GROWTH 1 DAY  Final   Report Status 11/01/2015 FINAL  Final  MRSA PCR Screening     Status: Abnormal   Collection Time: 10/31/15  8:40 AM  Result Value Ref Range Status   MRSA by PCR POSITIVE (A) NEGATIVE Final    Comment:        The GeneXpert MRSA Assay (FDA approved for NASAL specimens only), is one component of a comprehensive MRSA colonization surveillance program. It is not intended to diagnose MRSA infection nor to guide or monitor treatment for MRSA infections. RESULT CALLED TO, READ BACK BY AND VERIFIED WITH: SMITH RN 12:10 10/31/15 ([email or web address])   Respiratory virus panel     Status: None   Collection Time: 10/31/15 10:52 AM  Result Value Ref Range Status   Respiratory Syncytial Virus A Negative Negative Final   Respiratory Syncytial Virus B Negative Negative Final   Influenza A Negative Negative Final   Influenza B Negative Negative Final   Parainfluenza 1 Negative Negative Final   Parainfluenza 2 Negative Negative Final   Parainfluenza 3 Negative Negative Final   Metapneumovirus Negative Negative Final   Rhinovirus Negative Negative Final   Adenovirus Negative Negative Final    Comment: (NOTE) Performed At: [facility] [place], [place] [id number] Lindon Romp MD [id number]      Labs: Basic Metabolic Panel:  Recent Labs Lab 10/30/15 2328 11/01/15 0015  NA 129* 141  K 4.8 4.3  CL 101 111  CO2 21* 20*  GLUCOSE 129* 127*   BUN 22* 19  CREATININE 1.23 0.98  CALCIUM 8.8* 8.1*   Liver Function Tests:  Recent Labs Lab 10/30/15 2328  AST 40  ALT 15*  ALKPHOS 59  BILITOT 1.2  PROT 6.5  ALBUMIN 3.3*   No results for input(s): LIPASE, AMYLASE in the last 168 hours. No results for input(s): AMMONIA in the last 168 hours. CBC:  Recent Labs Lab 10/30/15 2328 11/01/15 0015 11/03/15 0534  WBC 10.2 9.0 7.3  NEUTROABS 9.3*  --   --   HGB 11.5* 10.0* 10.8*  HCT 36.0* 31.0* 33.0*  MCV 84.9 86.4 84.4  PLT 141* 103* 141*   Cardiac Enzymes:  Recent Labs Lab 10/31/15 1243 10/31/15 1808 11/01/15 0015  TROPONINI <0.03 <0.03 0.03   BNP: BNP (last 3 results) No results for input(s): BNP in the last 8760 hours.  ProBNP (last 3 results) No results for input(s): PROBNP in the last 8760 hours.  CBG:  Recent Labs Lab 11/04/15 1619 11/04/15 2022 11/05/15 0120 11/05/15 0451 11/05/15 1140  GLUCAP 158* 74 99 98 96    Signed:  Velvet Bathe MD.  [facility] 11/05/2015, 2:33 PM

## 2015-11-05 NOTE — Progress Notes (Signed)
   11/05/15 1526  PT Visit Information  Last PT Received On 11/05/15  Assistance Needed +2 (for lines)  Reason Eval/Treat Not Completed Other (comment) (Pt currently reviewing d/c with RN and in [facility] to d/c to [facility] with son for [place] rehab.  Will cancel tx.  )  History of Present Illness Pt adm with septic shock and aspiration PNA. PMH -lt CVA, HTN, Gout, depression, COPD  Governor R[place], [place] pager [phone]

## 2015-11-05 NOTE — Progress Notes (Signed)
Patient will discharge to [facility]  Anticipated discharge date: 4/12 Family notified: pt son at bedside Transportation by son- RN to release when ready  CSW signing off.  Domenica Reamer, [place] Social Worker [phone]

## 2015-11-05 NOTE — Clinical Social Work Placement (Signed)
   CLINICAL SOCIAL WORK PLACEMENT  NOTE  Date:  11/05/2015  Patient Details  Name: [name] MRN: [id number] Date of Birth: Dec 17, 1929  Clinical Social Work is seeking post-discharge placement for this patient at the [place] level of care (*CSW will initial, date and re-position this form in  chart as items are completed):  Yes   Patient/family provided with [place] Work Department's list of facilities offering this level of care within the geographic area requested by the patient (or if unable, by the patient's family).  Yes   Patient/family informed of their freedom to choose among providers that offer the needed level of care, that participate in Medicare, Medicaid or managed care program needed by the patient, have an available bed and are willing to accept the patient.  Yes   Patient/family informed of [facility]'s ownership interest in [facility] and [facility], as well as of the fact that they are under no obligation to receive care at these facilities.  PASRR submitted to EDS on       PASRR number received on       Existing PASRR number confirmed on 11/04/15     FL2 transmitted to all facilities in geographic area requested by pt/family on 11/04/15     FL2 transmitted to all facilities within larger geographic area on       Patient informed that his/her managed care company has contracts with or will negotiate with certain facilities, including the following:        Yes   Patient/family informed of bed offers received.  Patient chooses bed at [facility]     Physician recommends and patient chooses bed at      Patient to be transferred to [facility] on 11/05/15.  Patient to be transferred to facility by son     Patient family notified on 11/05/15 of transfer.  Name of family member notified:  Ulice Dash     PHYSICIAN       Additional Comment:    _______________________________________________ Cranford Mon, LCSW 11/05/2015, 2:53 PM

## 2015-11-26 ENCOUNTER — Inpatient Hospital Stay (HOSPITAL_COMMUNITY)
Admission: EM | Admit: 2015-11-26 | Discharge: 2015-12-25 | DRG: 064 | Disposition: E | Payer: Medicare Other | Attending: Neurology | Admitting: Neurology

## 2015-11-26 ENCOUNTER — Emergency Department (HOSPITAL_COMMUNITY): Payer: Medicare Other

## 2015-11-26 ENCOUNTER — Encounter (HOSPITAL_COMMUNITY): Payer: Self-pay | Admitting: Emergency Medicine

## 2015-11-26 DIAGNOSIS — R29810 Facial weakness: Secondary | ICD-10-CM | POA: Diagnosis present

## 2015-11-26 DIAGNOSIS — Z87891 Personal history of nicotine dependence: Secondary | ICD-10-CM | POA: Diagnosis not present

## 2015-11-26 DIAGNOSIS — Z91041 Radiographic dye allergy status: Secondary | ICD-10-CM

## 2015-11-26 DIAGNOSIS — Z96651 Presence of right artificial knee joint: Secondary | ICD-10-CM | POA: Diagnosis present

## 2015-11-26 DIAGNOSIS — R4020 Unspecified coma: Secondary | ICD-10-CM | POA: Diagnosis present

## 2015-11-26 DIAGNOSIS — R4701 Aphasia: Secondary | ICD-10-CM | POA: Diagnosis present

## 2015-11-26 DIAGNOSIS — R739 Hyperglycemia, unspecified: Secondary | ICD-10-CM | POA: Diagnosis present

## 2015-11-26 DIAGNOSIS — Z959 Presence of cardiac and vascular implant and graft, unspecified: Secondary | ICD-10-CM

## 2015-11-26 DIAGNOSIS — R29724 NIHSS score 24: Secondary | ICD-10-CM | POA: Diagnosis present

## 2015-11-26 DIAGNOSIS — Z8249 Family history of ischemic heart disease and other diseases of the circulatory system: Secondary | ICD-10-CM

## 2015-11-26 DIAGNOSIS — Z8546 Personal history of malignant neoplasm of prostate: Secondary | ICD-10-CM

## 2015-11-26 DIAGNOSIS — F0390 Unspecified dementia without behavioral disturbance: Secondary | ICD-10-CM | POA: Diagnosis present

## 2015-11-26 DIAGNOSIS — I610 Nontraumatic intracerebral hemorrhage in hemisphere, subcortical: Secondary | ICD-10-CM

## 2015-11-26 DIAGNOSIS — I10 Essential (primary) hypertension: Secondary | ICD-10-CM | POA: Diagnosis present

## 2015-11-26 DIAGNOSIS — G935 Compression of brain: Secondary | ICD-10-CM | POA: Diagnosis present

## 2015-11-26 DIAGNOSIS — Z7189 Other specified counseling: Secondary | ICD-10-CM | POA: Diagnosis not present

## 2015-11-26 DIAGNOSIS — R402352 Coma scale, best motor response, localizes pain, at arrival to emergency department: Secondary | ICD-10-CM | POA: Diagnosis present

## 2015-11-26 DIAGNOSIS — K219 Gastro-esophageal reflux disease without esophagitis: Secondary | ICD-10-CM | POA: Diagnosis present

## 2015-11-26 DIAGNOSIS — R402222 Coma scale, best verbal response, incomprehensible words, at arrival to emergency department: Secondary | ICD-10-CM | POA: Diagnosis present

## 2015-11-26 DIAGNOSIS — Z8679 Personal history of other diseases of the circulatory system: Secondary | ICD-10-CM

## 2015-11-26 DIAGNOSIS — Z7951 Long term (current) use of inhaled steroids: Secondary | ICD-10-CM | POA: Diagnosis not present

## 2015-11-26 DIAGNOSIS — I615 Nontraumatic intracerebral hemorrhage, intraventricular: Secondary | ICD-10-CM | POA: Diagnosis present

## 2015-11-26 DIAGNOSIS — L8990 Pressure ulcer of unspecified site, unspecified stage: Secondary | ICD-10-CM | POA: Diagnosis not present

## 2015-11-26 DIAGNOSIS — I609 Nontraumatic subarachnoid hemorrhage, unspecified: Secondary | ICD-10-CM | POA: Diagnosis present

## 2015-11-26 DIAGNOSIS — I680 Cerebral amyloid angiopathy: Secondary | ICD-10-CM | POA: Diagnosis present

## 2015-11-26 DIAGNOSIS — Z66 Do not resuscitate: Secondary | ICD-10-CM | POA: Diagnosis present

## 2015-11-26 DIAGNOSIS — J9600 Acute respiratory failure, unspecified whether with hypoxia or hypercapnia: Secondary | ICD-10-CM | POA: Diagnosis present

## 2015-11-26 DIAGNOSIS — F419 Anxiety disorder, unspecified: Secondary | ICD-10-CM | POA: Diagnosis present

## 2015-11-26 DIAGNOSIS — I619 Nontraumatic intracerebral hemorrhage, unspecified: Secondary | ICD-10-CM | POA: Diagnosis present

## 2015-11-26 DIAGNOSIS — Z823 Family history of stroke: Secondary | ICD-10-CM

## 2015-11-26 DIAGNOSIS — I611 Nontraumatic intracerebral hemorrhage in hemisphere, cortical: Principal | ICD-10-CM | POA: Diagnosis present

## 2015-11-26 DIAGNOSIS — R0600 Dyspnea, unspecified: Secondary | ICD-10-CM | POA: Insufficient documentation

## 2015-11-26 DIAGNOSIS — E854 Organ-limited amyloidosis: Secondary | ICD-10-CM | POA: Diagnosis present

## 2015-11-26 DIAGNOSIS — J449 Chronic obstructive pulmonary disease, unspecified: Secondary | ICD-10-CM | POA: Diagnosis present

## 2015-11-26 DIAGNOSIS — I69251 Hemiplegia and hemiparesis following other nontraumatic intracranial hemorrhage affecting right dominant side: Secondary | ICD-10-CM | POA: Diagnosis not present

## 2015-11-26 DIAGNOSIS — R402142 Coma scale, eyes open, spontaneous, at arrival to emergency department: Secondary | ICD-10-CM | POA: Diagnosis present

## 2015-11-26 DIAGNOSIS — F329 Major depressive disorder, single episode, unspecified: Secondary | ICD-10-CM | POA: Diagnosis present

## 2015-11-26 DIAGNOSIS — G936 Cerebral edema: Secondary | ICD-10-CM | POA: Diagnosis present

## 2015-11-26 DIAGNOSIS — Z515 Encounter for palliative care: Secondary | ICD-10-CM | POA: Diagnosis present

## 2015-11-26 LAB — DIFFERENTIAL
BASOS ABS: 0 10*3/uL (ref 0.0–0.1)
Basophils Relative: 0 %
Eosinophils Absolute: 0.5 10*3/uL (ref 0.0–0.7)
Eosinophils Relative: 9 %
LYMPHS ABS: 1 10*3/uL (ref 0.7–4.0)
LYMPHS PCT: 18 %
MONO ABS: 0.5 10*3/uL (ref 0.1–1.0)
MONOS PCT: 8 %
NEUTROS ABS: 3.8 10*3/uL (ref 1.7–7.7)
Neutrophils Relative %: 65 %

## 2015-11-26 LAB — I-STAT CHEM 8, ED
BUN: 23 mg/dL — ABNORMAL HIGH (ref 6–20)
CALCIUM ION: 1.08 mmol/L — AB (ref 1.13–1.30)
CHLORIDE: 105 mmol/L (ref 101–111)
CREATININE: 0.8 mg/dL (ref 0.61–1.24)
GLUCOSE: 196 mg/dL — AB (ref 65–99)
HCT: 40 % (ref 39.0–52.0)
Hemoglobin: 13.6 g/dL (ref 13.0–17.0)
Potassium: 4.3 mmol/L (ref 3.5–5.1)
Sodium: 141 mmol/L (ref 135–145)
TCO2: 23 mmol/L (ref 0–100)

## 2015-11-26 LAB — CBC
HEMATOCRIT: 36.6 % — AB (ref 39.0–52.0)
HEMOGLOBIN: 11.8 g/dL — AB (ref 13.0–17.0)
MCH: 27.6 pg (ref 26.0–34.0)
MCHC: 32.2 g/dL (ref 30.0–36.0)
MCV: 85.7 fL (ref 78.0–100.0)
Platelets: 131 10*3/uL — ABNORMAL LOW (ref 150–400)
RBC: 4.27 MIL/uL (ref 4.22–5.81)
RDW: 15.3 % (ref 11.5–15.5)
WBC: 5.9 10*3/uL (ref 4.0–10.5)

## 2015-11-26 LAB — I-STAT TROPONIN, ED: TROPONIN I, POC: 0 ng/mL (ref 0.00–0.08)

## 2015-11-26 LAB — URINALYSIS, ROUTINE W REFLEX MICROSCOPIC
BILIRUBIN URINE: NEGATIVE
Glucose, UA: NEGATIVE mg/dL
HGB URINE DIPSTICK: NEGATIVE
Ketones, ur: NEGATIVE mg/dL
Leukocytes, UA: NEGATIVE
NITRITE: NEGATIVE
PROTEIN: NEGATIVE mg/dL
SPECIFIC GRAVITY, URINE: 1.015 (ref 1.005–1.030)
pH: 7 (ref 5.0–8.0)

## 2015-11-26 LAB — COMPREHENSIVE METABOLIC PANEL
ALK PHOS: 60 U/L (ref 38–126)
ALT: 16 U/L — AB (ref 17–63)
AST: 26 U/L (ref 15–41)
Albumin: 3.3 g/dL — ABNORMAL LOW (ref 3.5–5.0)
Anion gap: 11 (ref 5–15)
BUN: 21 mg/dL — AB (ref 6–20)
CALCIUM: 8.6 mg/dL — AB (ref 8.9–10.3)
CHLORIDE: 107 mmol/L (ref 101–111)
CO2: 22 mmol/L (ref 22–32)
CREATININE: 1.01 mg/dL (ref 0.61–1.24)
Glucose, Bld: 202 mg/dL — ABNORMAL HIGH (ref 65–99)
Potassium: 4.4 mmol/L (ref 3.5–5.1)
Sodium: 140 mmol/L (ref 135–145)
Total Bilirubin: 0.3 mg/dL (ref 0.3–1.2)
Total Protein: 6.7 g/dL (ref 6.5–8.1)

## 2015-11-26 LAB — PROTIME-INR
INR: 1.01 (ref 0.00–1.49)
Prothrombin Time: 13.5 seconds (ref 11.6–15.2)

## 2015-11-26 LAB — RAPID URINE DRUG SCREEN, HOSP PERFORMED
AMPHETAMINES: NOT DETECTED
BARBITURATES: NOT DETECTED
BENZODIAZEPINES: NOT DETECTED
Cocaine: NOT DETECTED
Opiates: NOT DETECTED
Tetrahydrocannabinol: NOT DETECTED

## 2015-11-26 LAB — MRSA PCR SCREENING: MRSA by PCR: NEGATIVE

## 2015-11-26 LAB — ETHANOL

## 2015-11-26 LAB — APTT: APTT: 26 s (ref 24–37)

## 2015-11-26 LAB — TRIGLYCERIDES: Triglycerides: 87 mg/dL (ref <150)

## 2015-11-26 MED ORDER — ONDANSETRON 4 MG PO TBDP
4.0000 mg | ORAL_TABLET | Freq: Four times a day (QID) | ORAL | Status: DC | PRN
  Filled 2015-11-26: qty 1

## 2015-11-26 MED ORDER — HALOPERIDOL 1 MG PO TABS
0.5000 mg | ORAL_TABLET | ORAL | Status: DC | PRN
  Filled 2015-11-26: qty 1

## 2015-11-26 MED ORDER — GLYCOPYRROLATE 0.2 MG/ML IJ SOLN: 0.2000 mg | INTRAMUSCULAR | Status: DC | PRN

## 2015-11-26 MED ORDER — ACETAMINOPHEN 325 MG PO TABS: 650.0000 mg | ORAL_TABLET | Freq: Four times a day (QID) | ORAL | Status: DC | PRN

## 2015-11-26 MED ORDER — PROPOFOL 1000 MG/100ML IV EMUL
INTRAVENOUS | Status: AC
  Filled 2015-11-26: qty 100

## 2015-11-26 MED ORDER — ETOMIDATE 2 MG/ML IV SOLN
INTRAVENOUS | Status: DC | PRN
Start: 2015-11-26 — End: 2015-11-27
  Administered 2015-11-26: 20 mg via INTRAVENOUS

## 2015-11-26 MED ORDER — SENNOSIDES-DOCUSATE SODIUM 8.6-50 MG PO TABS: 1.0000 | ORAL_TABLET | Freq: Two times a day (BID) | ORAL | Status: DC

## 2015-11-26 MED ORDER — MIDAZOLAM HCL 2 MG/2ML IJ SOLN: 2.0000 mg | INTRAMUSCULAR | Status: DC | PRN

## 2015-11-26 MED ORDER — POLYVINYL ALCOHOL 1.4 % OP SOLN: 1.0000 [drp] | Freq: Four times a day (QID) | OPHTHALMIC | Status: DC | PRN

## 2015-11-26 MED ORDER — PROPOFOL 1000 MG/100ML IV EMUL
5.0000 ug/kg/min | INTRAVENOUS | Status: DC
  Administered 2015-11-26: 5 ug/kg/min via INTRAVENOUS

## 2015-11-26 MED ORDER — ACETAMINOPHEN 650 MG RE SUPP: 650.0000 mg | RECTAL | Status: DC | PRN

## 2015-11-26 MED ORDER — SODIUM CHLORIDE 0.9 % IV SOLN: INTRAVENOUS | Status: DC

## 2015-11-26 MED ORDER — ACETAMINOPHEN 325 MG PO TABS: 650.0000 mg | ORAL_TABLET | ORAL | Status: DC | PRN

## 2015-11-26 MED ORDER — ACETAMINOPHEN 650 MG RE SUPP: 650.0000 mg | Freq: Four times a day (QID) | RECTAL | Status: DC | PRN

## 2015-11-26 MED ORDER — HALOPERIDOL LACTATE 5 MG/ML IJ SOLN: 0.5000 mg | INTRAMUSCULAR | Status: DC | PRN

## 2015-11-26 MED ORDER — GLYCOPYRROLATE 1 MG PO TABS: 1.0000 mg | ORAL_TABLET | ORAL | Status: DC | PRN

## 2015-11-26 MED ORDER — LABETALOL HCL 5 MG/ML IV SOLN: 10.0000 mg | INTRAVENOUS | Status: DC | PRN

## 2015-11-26 MED ORDER — ONDANSETRON HCL 4 MG/2ML IJ SOLN: 4.0000 mg | Freq: Four times a day (QID) | INTRAMUSCULAR | Status: DC | PRN

## 2015-11-26 MED ORDER — FENTANYL CITRATE (PF) 100 MCG/2ML IJ SOLN: 50.0000 ug | INTRAMUSCULAR | Status: DC | PRN

## 2015-11-26 MED ORDER — STROKE: EARLY STAGES OF RECOVERY BOOK
Freq: Once | Status: DC
  Filled 2015-11-26: qty 1

## 2015-11-26 MED ORDER — ROCURONIUM BROMIDE 50 MG/5ML IV SOLN
INTRAVENOUS | Status: DC | PRN
  Administered 2015-11-26: 70 mg via INTRAVENOUS

## 2015-11-26 MED ORDER — BIOTENE DRY MOUTH MT LIQD: 15.0000 mL | OROMUCOSAL | Status: DC | PRN

## 2015-11-26 MED ORDER — PROPOFOL 1000 MG/100ML IV EMUL: 0.0000 ug/kg/min | INTRAVENOUS | Status: DC

## 2015-11-26 MED ORDER — HALOPERIDOL LACTATE 2 MG/ML PO CONC: 0.5000 mg | ORAL | Status: DC | PRN

## 2015-11-26 MED ORDER — NICARDIPINE HCL IN NACL 20-0.86 MG/200ML-% IV SOLN: 3.0000 mg/h | INTRAVENOUS | Status: DC

## 2015-11-26 MED ORDER — PANTOPRAZOLE SODIUM 40 MG IV SOLR: 40.0000 mg | Freq: Every day | INTRAVENOUS | Status: DC

## 2015-11-26 MED ORDER — SODIUM CHLORIDE 0.9 % IV SOLN
1.0000 mg/h | INTRAVENOUS | Status: DC
  Administered 2015-11-26 – 2015-11-28 (×2): 1 mg/h via INTRAVENOUS
  Filled 2015-11-26 (×2): qty 10

## 2015-11-26 NOTE — ED Notes (Addendum)
Per EMS, pt coming in from home. Pt coming in new onset of aphasia starting at 730 this morning. Pt has hx of stroke in 2012 which caused right sided weakness. Pt unable to follow commands at this time. HR: 56, BP: 154/76, SpO2: 95: 3L, RR:18. Pt usually able to walk around independently.

## 2015-11-26 NOTE — Progress Notes (Signed)
   11/25/2015 1402  Clinical Encounter Type  Visited With Family;Health care provider  Visit Type Initial;Patient actively dying;ED;Critical Care  Referral From Nurse  Stress Factors  Family Stress Factors Loss;Health changes   Chaplain responded to a request to help family transition to Neuro-ICU. Chaplain offered support and assisted with family getting to patient's room. Chaplain support available as needed.   Jeri Lager, Chaplain 12/08/2015 2:04 PM

## 2015-11-26 NOTE — Progress Notes (Signed)
Neurology Follow up Note:  Patient's medical record was reviewed and patient was briefly evaluated by me. He has suffered a massive intracerebral hemorrhage involving his dominant hemisphere. There is marked mass effect with intraventricular extension and left to right midline shift. Any chance of clinical recovery, or even survival, is inconceivable.  I agree with decision for extubation and comfort measures only. Aggressive management measures beyond this point would be futile.  Rush Farmer M.D. [facility] [phone]

## 2015-11-26 NOTE — H&P (Signed)
H&P    Chief Complaint: ICH   HPI:                                                                                                                                         [name] is an 82 y.o. male with known cerebral amyloid and history of ICH. Presented to hospital today due to change in mental status. On arrival he was found to have large left ICH with IC component. Patient was having difficulty with air way and after talking with son the decision to intubate was made.   Date last known well:5.2.2017 Time last known well: Time: 07:30 tPA Given: No: ICH     Past Medical History  Diagnosis Date  . COPD (chronic obstructive pulmonary disease) ([place])   . GERD (gastroesophageal reflux disease)   . Anxiety and depression   . Gout   . CVA (cerebral infarction) 2012    Left parietal ICH; "left him weaker on the right side" (03/13/2013)  . GIB (gastrointestinal bleeding) 04/12  . Diverticulosis   . Family history of anesthesia complication     "makes son nauseous" (03/13/2013)  . Hypertension   . Pneumonia     "several times; last time 12-18 months ago"  (03/13/2013)  . Exertional shortness of breath   . Arthritis     "neck; hands" (03/13/2013)  . Prostate cancer ([place])   . Stroke [facility])     Past Surgical History  Procedure Laterality Date  . Prostate surgery    . Total knee arthroplasty Right   . Shoulder surgery Right 2003    decompression; "fell and broke it years ago; doesn't have plates or screws in there;" (03/13/2013)  . Carotid stent insertion Bilateral     Family History  Problem Relation Age of Onset  . Cancer Father   . Diabetes Sister   . Heart disease Sister   . Hyperlipidemia Sister   . Hypertension Sister    Social History:  reports that he quit smoking about 22 years ago. His smoking use included Cigarettes. He has a 81 pack-year smoking history. He has never used smokeless tobacco. He reports that he drinks alcohol. He reports that he does not use  illicit drugs.  Allergies:  Allergies  Allergen Reactions  . Contrast Media [Iodinated Diagnostic Agents] Hives, Shortness Of Breath, Swelling and Rash    Medications:  Current Facility-Administered Medications  Medication Dose Route Frequency Provider Last Rate Last Dose  . etomidate (AMIDATE) injection   Intravenous PRN Ezequiel Essex, MD   20 mg at 12/14/2015 0955  . fentaNYL (SUBLIMAZE) injection 50 mcg  50 mcg Intravenous Q15 min PRN Ezequiel Essex, MD      . fentaNYL (SUBLIMAZE) injection 50 mcg  50 mcg Intravenous Q2H PRN Ezequiel Essex, MD      . propofol (DIPRIVAN) 1000 MG/100ML infusion  5-70 mcg/kg/min (Order-Specific) Intravenous Titrated Ezequiel Essex, MD      . propofol (DIPRIVAN) 1000 MG/100ML infusion           . propofol (DIPRIVAN) 1000 MG/100ML infusion  0-50 mcg/kg/min Intravenous Continuous Ezequiel Essex, MD      . rocuronium [facility]) injection   Intravenous PRN Ezequiel Essex, MD   70 mg at 12/17/2015 [id number]   Current Outpatient Prescriptions  Medication Sig Dispense Refill  . albuterol (PROVENTIL HFA;VENTOLIN HFA) 108 (90 BASE) MCG/ACT inhaler Inhale 2 puffs into the lungs as needed for wheezing.    . budesonide-formoterol (SYMBICORT) 80-4.5 MCG/ACT inhaler Inhale 2 puffs into the lungs 2 (two) times daily.    . cefpodoxime (VANTIN) 200 MG tablet Take 1 tablet (200 mg total) by mouth every 12 (twelve) hours. 10 tablet 0  . Cholecalciferol (VITAMIN D PO) Take 1,000 mg by mouth daily.     . finasteride (PROSCAR) 5 MG tablet Take 5 mg by mouth daily.    . mirtazapine (REMERON) 30 MG tablet Take 30 mg by mouth at bedtime.    Marland Kitchen omeprazole (PRILOSEC) 20 MG capsule Take 20 mg by mouth daily.      . sertraline (ZOLOFT) 100 MG tablet Take 100 mg by mouth 2 (two) times daily.       ROS:                                                                                                                                        History obtained from unobtainable from patient due to mental status   Neurologic Examination:                                                                                                      Blood pressure 160/69, resp. rate 20, SpO2 95 %.  HEENT-  Normocephalic, no lesions, without obvious abnormality.  Normal external eye and conjunctiva.  Normal TM's bilaterally.  Normal auditory canals and external ears. Normal external nose, mucus membranes and septum.  Normal pharynx. Cardiovascular- S1, S2 normal, pulses palpable throughout   Lungs- chest clear, no wheezing, rales, normal symmetric air entry Abdomen- normal findings: bowel sounds normal Extremities- no edema Lymph-no adenopathy palpable Musculoskeletal-no joint tenderness, deformity or swelling Skin-warm and dry, no hyperpigmentation, vitiligo, or suspicious lesions  Neurological Examination Mental Status: Patient does not respond to verbal stimuli.  Does not respond to deep sternal rub.  Does not follow commands.  No verbalizations noted.  Cranial Nerves: II: patient does not respond confrontation bilaterally, pupils right 2 mm, left 2 mm,and reactive bilaterally III,IV,VI: doll's response present bilaterally. V,VII: corneal reflex present bilaterally  VIII: patient does not respond to verbal stimuli IX,X: gag reflex present, XI: trapezius strength unable to test bilaterally XII: tongue strength unable to test Motor: Extremities flaccid throughout right side UE. Withdraws from pan on the left hand and toe.  Triple reflex on right LE Sensory: As above Deep Tendon Reflexes:  2+ bilaterally Plantars: upgoing on the right Cerebellar: Unable to perform       Lab Results: Basic Metabolic Panel:  Recent Labs Lab 12/18/2015 0930  NA 141  K 4.3  CL 105  GLUCOSE 196*  BUN 23*  CREATININE 0.80    Liver Function Tests: No results for input(s):  AST, ALT, ALKPHOS, BILITOT, PROT, ALBUMIN in the last 168 hours. No results for input(s): LIPASE, AMYLASE in the last 168 hours. No results for input(s): AMMONIA in the last 168 hours.  CBC:  Recent Labs Lab 12/04/2015 0924 12/16/2015 0930  WBC 5.9  --   NEUTROABS 3.8  --   HGB 11.8* 13.6  HCT 36.6* 40.0  MCV 85.7  --   PLT 131*  --     Cardiac Enzymes: No results for input(s): CKTOTAL, CKMB, CKMBINDEX, TROPONINI in the last 168 hours.  Lipid Panel: No results for input(s): CHOL, TRIG, HDL, CHOLHDL, VLDL, LDLCALC in the last 168 hours.  CBG: No results for input(s): GLUCAP in the last 168 hours.  Microbiology: Results for orders placed or performed during the hospital encounter of 10/30/15  Culture, blood (Routine x 2)     Status: None   Collection Time: 10/30/15 11:28 PM  Result Value Ref Range Status   Specimen Description BLOOD LEFT FOREARM  Final   Special Requests BOTTLES DRAWN AEROBIC AND ANAEROBIC 5ML  Final   Culture NO GROWTH 5 DAYS  Final   Report Status 11/05/2015 FINAL  Final  Culture, blood (Routine x 2)     Status: None   Collection Time: 10/30/15 11:32 PM  Result Value Ref Range Status   Specimen Description BLOOD RIGHT ARM  Final   Special Requests BOTTLES DRAWN AEROBIC AND ANAEROBIC 5ML  Final   Culture NO GROWTH 5 DAYS  Final   Report Status 11/05/2015 FINAL  Final  Urine culture     Status: None   Collection Time: 10/31/15  1:01 AM  Result Value Ref Range Status   Specimen Description URINE, CATHETERIZED  Final   Special Requests NONE  Final   Culture NO GROWTH 1 DAY  Final   Report Status 11/01/2015 FINAL  Final  MRSA PCR Screening     Status: Abnormal   Collection Time: 10/31/15  8:40 AM  Result Value Ref Range Status   MRSA by PCR POSITIVE (A) NEGATIVE Final    Comment:        The GeneXpert MRSA Assay (FDA approved for NASAL specimens only), is one component of a comprehensive MRSA colonization surveillance program. It  is not intended to  diagnose MRSA infection nor to guide or monitor treatment for MRSA infections. RESULT CALLED TO, READ BACK BY AND VERIFIED WITH: Richie Bonanno RN 12:10 10/31/15 ([email or web address])   Respiratory virus panel     Status: None   Collection Time: 10/31/15 10:52 AM  Result Value Ref Range Status   Respiratory Syncytial Virus A Negative Negative Final   Respiratory Syncytial Virus B Negative Negative Final   Influenza A Negative Negative Final   Influenza B Negative Negative Final   Parainfluenza 1 Negative Negative Final   Parainfluenza 2 Negative Negative Final   Parainfluenza 3 Negative Negative Final   Metapneumovirus Negative Negative Final   Rhinovirus Negative Negative Final   Adenovirus Negative Negative Final    Comment: (NOTE) Performed At: [facility] [address] [place], [place] [id number] Lindon Romp MD [id number]     Coagulation Studies: No results for input(s): LABPROT, INR in the last 72 hours.  Imaging: Ct Head Wo Contrast  11/25/2015  CLINICAL DATA:  Code stroke. Facial droop and altered mental status. EXAM: CT HEAD WITHOUT CONTRAST TECHNIQUE: Contiguous axial images were obtained from the base of the skull through the vertex without intravenous contrast. COMPARISON:  07/09/2015 FINDINGS: There is a very large parenchymal hemorrhage involving the left frontal and parietal lobes measuring approximately 11.1 x 4.6 cm. There is intraventricular extension at the level of the posterior body/atrium of the left lateral ventricle with blood filling the atrium and occipital horn. A small amount of hemorrhage is present in the atrium/occipital horn on the right. There is left cerebral hemispheric edema surrounding the hemorrhage with associated sulcal effacement and 5 mm of rightward midline shift. A small amount of subarachnoid hemorrhage is present in several sulci at the level of the sylvian fissure on the left as well as at the anterior aspect of the left frontal lobe extending  into the interhemispheric fissure. Chronic right occipital and left parietal cortical infarcts are noted. Chronic small vessel ischemic disease is noted in the cerebral white matter. Patchy hypoattenuation throughout the deep gray nuclei bilaterally may reflect chronic small vessel ischemic changes, though superimposed acute ischemia is not excluded superiorly on the left. Prior bilateral cataract extraction is noted. The visualized paranasal sinuses and mastoid air cells are clear. Calcified atherosclerosis is noted at the skullbase. Prior endovascular treatment of a right. 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 IMPRESSION: 1. 11 cm left frontoparietal parenchymal hemorrhage with intraventricular extension. 2. Small volume subarachnoid hemorrhage. 3. Chronic right occipital and left parietal cortical infarcts. Critical Value/emergent results were called by telephone at the time of interpretation on 12/22/2015 at 9:37 am to Dr. Cristobal Goldmann, who verbally acknowledged these results. Electronically Signed   By: Logan Bores M.D.   On: 12/12/2015 09:46       Assessment and plan discussed with with attending physician and they are in agreement.    Etta Quill PA-C [facility] [phone]  12/04/2015, 9:51 AM   Assessment: 82 y.o. male with large left ICH. Patietn is currently intubated and will be transported to ICU.   Stroke Risk Factors - hypertension  Recommend: 1. HgbA1c, fasting lipid panel 2. MRI,  3. PT consult, OT consult, Speech consult 4. Echocardiogram 5. Carotid dopplers 6. Prophylactic therapy-None 7. Risk factor modification 8. Telemetry monitoring 9. Frequent neuro checks 10 NPO until passes stroke swallow screen 11. BP systolic [id number] 12.  please page stroke NP  Or  PA  Or MD from 8am -4 pm  as this patient from this time will be  followed by the stroke.   You can look them up on [email or web address]  Password [email or web address]

## 2015-11-26 NOTE — Code Documentation (Signed)
Successful intubation by MD Rancour.

## 2015-11-26 NOTE — Consult Note (Addendum)
Name: TREVION BASER MRN: [id number] DOB: 23-Mar-1930    ADMISSION DATE:  12/18/2015 CONSULTATION DATE:  12/04/2015  REFERRING MD :  Neuro     CHIEF COMPLAINT:  Massive ICH  BRIEF PATIENT DESCRIPTION: massive ich  SIGNIFICANT EVENTS  Massive bleed, vent  STUDIES:  CT head 5/3>>>massive large icg fp    HISTORY OF PRESENT ILLNESS:  82 yr old seen by Dr Leonie Man with angiopathy cerebral. Prior ICH in past with deficit. Was in normal state health. Had sudden chang ein MS. To ER,. CT with massive ICH, extension. There were discussions with ETT ? Futility but decided for ett. Called for vent management and critical care needs. Family arriving. NO asp, no vomit, some HTn on prop.  PAST MEDICAL HISTORY :   has a past medical history of COPD (chronic obstructive pulmonary disease) ([place]); GERD (gastroesophageal reflux disease); Anxiety and depression; Gout; CVA (cerebral infarction) (2012); GIB (gastrointestinal bleeding) (04/12); Diverticulosis; Family history of anesthesia complication; Hypertension; Pneumonia; Exertional shortness of breath; Arthritis; Prostate cancer ([place]); and Stroke ([place]).  has past surgical history that includes Prostate surgery; Total knee arthroplasty (Right); Shoulder surgery (Right, 2003); and Carotid stent insertion (Bilateral). Prior to Admission medications   Medication Sig Start Date End Date Taking? Authorizing Provider  albuterol (PROVENTIL HFA;VENTOLIN HFA) 108 (90 BASE) MCG/ACT inhaler Inhale 2 puffs into the lungs as needed for wheezing.    Historical Provider, MD  budesonide-formoterol (SYMBICORT) 80-4.5 MCG/ACT inhaler Inhale 2 puffs into the lungs 2 (two) times daily.    Historical Provider, MD  cefpodoxime (VANTIN) 200 MG tablet Take 1 tablet (200 mg total) by mouth every 12 (twelve) hours. 11/05/15   Velvet Bathe, MD  Cholecalciferol (VITAMIN D PO) Take 1,000 mg by mouth daily.     Historical Provider, MD  finasteride (PROSCAR) 5 MG tablet Take 5 mg by mouth daily.     Historical Provider, MD  mirtazapine (REMERON) 30 MG tablet Take 30 mg by mouth at bedtime.    Historical Provider, MD  omeprazole (PRILOSEC) 20 MG capsule Take 20 mg by mouth daily.      Historical Provider, MD  sertraline (ZOLOFT) 100 MG tablet Take 100 mg by mouth 2 (two) times daily. 10/21/15   Historical Provider, MD   Allergies  Allergen Reactions  . Contrast Media [Iodinated Diagnostic Agents] Hives, Shortness Of Breath, Swelling and Rash    FAMILY HISTORY:  family history includes Cancer in his father; Diabetes in his sister; Heart disease in his sister; Hyperlipidemia in his sister; Hypertension in his sister. SOCIAL HISTORY:  reports that he quit smoking about 22 years ago. His smoking use included Cigarettes. He has a 81 pack-year smoking history. He has never used smokeless tobacco. He reports that he drinks alcohol. He reports that he does not use illicit drugs.  REVIEW OF SYSTEMS:   unable  SUBJECTIVE: unresponsive   VITAL SIGNS: Pulse Rate:  [49-68] 50 (05/03 1015) Resp:  [15-32] 32 (05/03 1015) BP: (147-160)/(67-90) 147/82 mmHg (05/03 1015) SpO2:  [95 %-100 %] 100 % (05/03 1015) FiO2 (%):  [40 %] 40 % (05/03 0959) Weight:  [68.6 kg (151 lb 3.8 oz)] 68.6 kg (151 lb 3.8 oz) (05/03 1000)  PHYSICAL EXAMINATION: General:  Unresponsive, on vent  Neuro:  Pupils fixed 5 mm HEENT: no jvd, ett Cardiovascular:  s1 s2 RRB brady 45 Lungs:  CTA distant Abdomen:  Soft, cachectic, scaffoid, BS low,. No r Musculoskeletal:  Low muscle mass Skin:  No rash   Recent  Labs Lab 11/25/2015 0924 12/17/2015 0930  NA 140 141  K 4.4 4.3  CL 107 105  CO2 22  --   BUN 21* 23*  CREATININE 1.01 0.80  GLUCOSE 202* 196*    Recent Labs Lab 12/24/2015 0924 12/09/2015 0930  HGB 11.8* 13.6  HCT 36.6* 40.0  WBC 5.9  --   PLT 131*  --    Ct Head Wo Contrast  12/13/2015  CLINICAL DATA:  Code stroke. Facial droop and altered mental status. EXAM: CT HEAD WITHOUT CONTRAST TECHNIQUE:  Contiguous axial images were obtained from the base of the skull through the vertex without intravenous contrast. COMPARISON:  07/09/2015 FINDINGS: There is a very large parenchymal hemorrhage involving the left frontal and parietal lobes measuring approximately 11.1 x 4.6 cm. There is intraventricular extension at the level of the posterior body/atrium of the left lateral ventricle with blood filling the atrium and occipital horn. A small amount of hemorrhage is present in the atrium/occipital horn on the right. There is left cerebral hemispheric edema surrounding the hemorrhage with associated sulcal effacement and 5 mm of rightward midline shift. A small amount of subarachnoid hemorrhage is present in several sulci at the level of the sylvian fissure on the left as well as at the anterior aspect of the left frontal lobe extending into the interhemispheric fissure. Chronic right occipital and left parietal cortical infarcts are noted. Chronic small vessel ischemic disease is noted in the cerebral white matter. Patchy hypoattenuation throughout the deep gray nuclei bilaterally may reflect chronic small vessel ischemic changes, though superimposed acute ischemia is not excluded superiorly on the left. Prior bilateral cataract extraction is noted. The visualized paranasal sinuses and mastoid air cells are clear. Calcified atherosclerosis is noted at the skullbase. Prior endovascular treatment of a right. 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 IMPRESSION: 1. 11 cm left frontoparietal parenchymal hemorrhage with intraventricular extension. 2. Small volume subarachnoid hemorrhage. 3. Chronic right occipital and left parietal cortical infarcts. Critical Value/emergent results were called by telephone at the time of interpretation on 12/01/2015 at 9:37 am to Dr. Cristobal Goldmann, who verbally acknowledged these results. Electronically Signed   By: Logan Bores M.D.   On: 12/15/2015 09:46   Dg  Chest Portable 1 View  12/11/2015  CLINICAL DATA:  Endotracheal tube placement EXAM: PORTABLE CHEST 1 VIEW COMPARISON:  CT chest 10/31/2015 FINDINGS: Endotracheal tube with the tip 3.3 cm above the carina. Nasogastric tube coursing below the diaphragm. There is mild bilateral chronic interstitial thickening. There is no focal parenchymal opacity. There is no pleural effusion or pneumothorax. The heart and mediastinal contours are unremarkable. The osseous structures are unremarkable. IMPRESSION: Endotracheal tube with the tip 3.3 cm above the carina. Nasogastric tube coursing below the diaphragm. Electronically Signed   By: Kathreen Devoid   On: 12/15/2015 10:27    ASSESSMENT / PLAN:  Massive ICH H/o Cerebral Amyloid Acute Resp Failure, type 4  - this appears to NOT be survivable -I have had extensive discussions with family. We discussed patients current circumstances and organ failures. We also discussed patient's prior wishes under circumstances such as this.  We discussed the poor prognosis and likely poor quality of life. Family has decided to offer full comfort care. They are aware that the patient may be transferred to icu then will extubate once family allows / arrives. -NO escalation Start morphine drip for now, titrate prop to off -NO rise rate on vent -no abg needed _  i d/w neuro and EDP DNR written i called [facility]  Ccm time spent 35 min   Lavon Paganini. Titus Mould, MD, [place] Pgr: [place]  Pulmonary and [place] Pager: [phone]  11/25/2015, 10:43 AM

## 2015-11-26 NOTE — Code Documentation (Signed)
82yo male arriving to [facility] via [place] at [phone].  Patient from home where he was noticed to have increased right sided weakness and aphasia at 0730.  Initially family reported LKW at midnight but later reported 61. Patient with h/o hemorrhagic infarct in 2012 with residual right sided weakness.  Stroke team at the bedside on patient arrival.  Labs drawn and patient transported to CT.  CT completed showing large left ICH.  Patient transferred to Trauma C.   Dr. Wyvonnia Dusky and Dr. Cristobal Goldmann to the bedside.  NIHSS 24, see documentation for details and code stroke times.  Patient with global aphasia and right hemiplegia on exam.  Family to the bedside and plan of care discussed.  Patient to be admitted to ICU.  Bedside handoff with ED RN Rodman Key.

## 2015-11-26 NOTE — ED Provider Notes (Signed)
CSN: [id number]     Arrival date & time 12/12/2015  [id number] History   First MD Initiated Contact with Patient 12/21/2015 [phone]     Chief Complaint  Patient presents with  . Code Stroke    @EDPCLEARED @ (Consider location/radiation/quality/duration/timing/severity/associated sxs/prior Treatment) HPI Comments: Level V caveat for acuity of condition. Patient brought in as code stroke with new onset right facial droop and aphasia noticed at 7:30 this morning. History of stroke in 2012 with persistent right-sided weakness. Patient is nonverbal on arrival and somnolent. He is moving his left side was flaccid on the right. Recent admission for aspiration pneumonia last month. He is not on any blood thinners.  Patient taken directly to CT scan.  The history is provided by the patient, the EMS personnel and a relative. The history is limited by the condition of the patient.    Past Medical History  Diagnosis Date  . COPD (chronic obstructive pulmonary disease) ([place])   . GERD (gastroesophageal reflux disease)   . Anxiety and depression   . Gout   . CVA (cerebral infarction) 2012    Left parietal ICH; "left him weaker on the right side" (03/13/2013)  . GIB (gastrointestinal bleeding) 04/12  . Diverticulosis   . Family history of anesthesia complication     "makes son nauseous" (03/13/2013)  . Hypertension   . Pneumonia     "several times; last time 12-18 months ago"  (03/13/2013)  . Exertional shortness of breath   . Arthritis     "neck; hands" (03/13/2013)  . Prostate cancer ([place])   . Stroke [facility])    Past Surgical History  Procedure Laterality Date  . Prostate surgery    . Total knee arthroplasty Right   . Shoulder surgery Right 2003    decompression; "fell and broke it years ago; doesn't have plates or screws in there;" (03/13/2013)  . Carotid stent insertion Bilateral    Family History  Problem Relation Age of Onset  . Cancer Father   . Diabetes Sister   . Heart disease Sister   .  Hyperlipidemia Sister   . Hypertension Sister    Social History  Substance Use Topics  . Smoking status: Former Smoker -- 1.50 packs/day for 54 years    Types: Cigarettes    Quit date: 07/26/1993  . Smokeless tobacco: Never Used  . Alcohol Use: Yes    Review of Systems  Unable to perform ROS: Acuity of condition      Allergies  Contrast media  Home Medications   Prior to Admission medications   Medication Sig Start Date End Date Taking? Authorizing Provider  albuterol (PROVENTIL HFA;VENTOLIN HFA) 108 (90 BASE) MCG/ACT inhaler Inhale 2 puffs into the lungs as needed for wheezing.    Historical Provider, MD  budesonide-formoterol (SYMBICORT) 80-4.5 MCG/ACT inhaler Inhale 2 puffs into the lungs 2 (two) times daily.    Historical Provider, MD  cefpodoxime (VANTIN) 200 MG tablet Take 1 tablet (200 mg total) by mouth every 12 (twelve) hours. 11/05/15   Velvet Bathe, MD  Cholecalciferol (VITAMIN D PO) Take 1,000 mg by mouth daily.     Historical Provider, MD  finasteride (PROSCAR) 5 MG tablet Take 5 mg by mouth daily.    Historical Provider, MD  mirtazapine (REMERON) 30 MG tablet Take 30 mg by mouth at bedtime.    Historical Provider, MD  omeprazole (PRILOSEC) 20 MG capsule Take 20 mg by mouth daily.      Historical Provider, MD  sertraline (ZOLOFT) 100  MG tablet Take 100 mg by mouth 2 (two) times daily. 10/21/15   Historical Provider, MD   BP 120/54 mmHg  Pulse 50  Temp(Src) 96.4 F (35.8 C) (Core (Comment))  Resp 19  Wt 151 lb 3.8 oz (68.6 kg)  SpO2 91% Physical Exam  Constitutional: He appears well-developed and well-nourished. No distress.  Tachypnea, irregular respirations, obtunded  HENT:  Head: Normocephalic and atraumatic.  Mouth/Throat: No oropharyngeal exudate.  Dry mucus membranes  Eyes: Conjunctivae are normal. Pupils are equal, round, and reactive to light.  Neck: Normal range of motion. Neck supple.  Cardiovascular: Normal rate, regular rhythm and normal heart  sounds.   No murmur heard. Pulmonary/Chest: He is in respiratory distress. He has rales.  Abdominal: Soft. There is no tenderness. There is no rebound and no guarding.  Musculoskeletal: He exhibits no edema or tenderness.  Neurological: A cranial nerve deficit is present.  R side flaccid, R facial droop Follows some commands on L.    ED Course  .Intubation Date/Time: 12/06/2015 11:06 AM Performed by: Ezequiel Essex Authorized by: Ezequiel Essex Consent: The procedure was performed in an emergent situation. Verbal consent obtained. Risks and benefits: risks, benefits and alternatives were discussed Consent given by: patient and power of attorney Patient understanding: patient states understanding of the procedure being performed Patient consent: the patient's understanding of the procedure matches consent given Procedure consent: procedure consent matches procedure scheduled Relevant documents: relevant documents present and verified Test results: test results available and properly labeled Patient identity confirmed: provided demographic data and verbally with patient Indications: respiratory distress and  respiratory failure Intubation method: video-assisted Patient status: paralyzed (RSI) Preoxygenation: nonrebreather mask Sedatives: etomidate Paralytic: succinylcholine Laryngoscope size: Mac 4 Tube size: 8.0 mm Tube type: cuffed Number of attempts: 1 Ventilation between attempts: BVM Cricoid pressure: no Cords visualized: yes Post-procedure assessment: chest rise Breath sounds: equal Cuff inflated: yes ETT to lip: 25 cm Tube secured with: ETT holder Chest x-ray interpreted by me. Chest x-ray findings: endotracheal tube in appropriate position Patient tolerance: Patient tolerated the procedure well with no immediate complications   (including critical care time) Labs Review Labs Reviewed  CBC - Abnormal; Notable for the following:    Hemoglobin 11.8 (*)    HCT  36.6 (*)    Platelets 131 (*)    All other components within normal limits  COMPREHENSIVE METABOLIC PANEL - Abnormal; Notable for the following:    Glucose, Bld 202 (*)    BUN 21 (*)    Calcium 8.6 (*)    Albumin 3.3 (*)    ALT 16 (*)    All other components within normal limits  I-STAT CHEM 8, ED - Abnormal; Notable for the following:    BUN 23 (*)    Glucose, Bld 196 (*)    Calcium, Ion 1.08 (*)    All other components within normal limits  MRSA PCR SCREENING  ETHANOL  PROTIME-INR  APTT  DIFFERENTIAL  URINE RAPID DRUG SCREEN, HOSP PERFORMED  URINALYSIS, ROUTINE W REFLEX MICROSCOPIC (NOT AT [facility])  TRIGLYCERIDES  I-STAT TROPOININ, ED    Imaging Review Ct Head Wo Contrast  12/04/2015  CLINICAL DATA:  Code stroke. Facial droop and altered mental status. EXAM: CT HEAD WITHOUT CONTRAST TECHNIQUE: Contiguous axial images were obtained from the base of the skull through the vertex without intravenous contrast. COMPARISON:  07/09/2015 FINDINGS: There is a very large parenchymal hemorrhage involving the left frontal and parietal lobes measuring approximately 11.1 x 4.6 cm. There is  intraventricular extension at the level of the posterior body/atrium of the left lateral ventricle with blood filling the atrium and occipital horn. A small amount of hemorrhage is present in the atrium/occipital horn on the right. There is left cerebral hemispheric edema surrounding the hemorrhage with associated sulcal effacement and 5 mm of rightward midline shift. A small amount of subarachnoid hemorrhage is present in several sulci at the level of the sylvian fissure on the left as well as at the anterior aspect of the left frontal lobe extending into the interhemispheric fissure. Chronic right occipital and left parietal cortical infarcts are noted. Chronic small vessel ischemic disease is noted in the cerebral white matter. Patchy hypoattenuation throughout the deep gray nuclei bilaterally may reflect chronic  small vessel ischemic changes, though superimposed acute ischemia is not excluded superiorly on the left. Prior bilateral cataract extraction is noted. The visualized paranasal sinuses and mastoid air cells are clear. Calcified atherosclerosis is noted at the skullbase. Prior endovascular treatment of a right. 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 IMPRESSION: 1. 11 cm left frontoparietal parenchymal hemorrhage with intraventricular extension. 2. Small volume subarachnoid hemorrhage. 3. Chronic right occipital and left parietal cortical infarcts. Critical Value/emergent results were called by telephone at the time of interpretation on 12/17/2015 at 9:37 am to Dr. Cristobal Goldmann, who verbally acknowledged these results. Electronically Signed   By: Logan Bores M.D.   On: 12/16/2015 09:46   Dg Chest Portable 1 View  11/25/2015  CLINICAL DATA:  Endotracheal tube placement EXAM: PORTABLE CHEST 1 VIEW COMPARISON:  CT chest 10/31/2015 FINDINGS: Endotracheal tube with the tip 3.3 cm above the carina. Nasogastric tube coursing below the diaphragm. There is mild bilateral chronic interstitial thickening. There is no focal parenchymal opacity. There is no pleural effusion or pneumothorax. The heart and mediastinal contours are unremarkable. The osseous structures are unremarkable. IMPRESSION: Endotracheal tube with the tip 3.3 cm above the carina. Nasogastric tube coursing below the diaphragm. Electronically Signed   By: Kathreen Devoid   On: 11/27/2015 10:27   I have personally reviewed and evaluated these images and lab results as part of my medical decision-making.   EKG Interpretation   Date/Time:  Wednesday Nov 26 2015 09:37:13 EDT Ventricular Rate:  50 PR Interval:  185 QRS Duration: 154 QT Interval:  530 QTC Calculation: 483 R Axis:   82 Text Interpretation:  Sinus rhythm Atrial premature complexes Right bundle  branch block No significant change was found Confirmed by  Chany Woolworth  MD,  [place] ([id number]) on 12/15/2015 10:22:54 AM      MDM   Final diagnoses:  ICH (intracerebral hemorrhage) ([place])   Code stroke with acute change in mental status.  LSN 730 am . Neurology at bedside on arrival.  CT shows large ICH.  Patient not on anticoagulation.  D/w son at bedside that patient is not protecting airway well.  However with his large ICH, he may not have recovery anyway.  Family wishes him to be intubated and will discuss with other family.  D/w Dr. Ronnald Ramp of neurosurgery.  He states prognosis is dismal and he has nothing to offer patient.  Propofol for sedation, BP control.  ICU admit d/w Dr. Titus Mould.    CRITICAL CARE Performed by: Ezequiel Essex Total critical care time: 37minutes Critical care time was exclusive of separately billable procedures and treating other patients. Critical care was necessary to treat or prevent imminent or life-threatening deterioration. Critical care was time spent personally by  me on the following activities: development of treatment plan with patient and/or surrogate as well as nursing, discussions with consultants, evaluation of patient's response to treatment, examination of patient, obtaining history from patient or surrogate, ordering and performing treatments and interventions, ordering and review of laboratory studies, ordering and review of radiographic studies, pulse oximetry and re-evaluation of patient's condition.   Ezequiel Essex, MD 12/24/2015 [phone]

## 2015-11-26 NOTE — ED Notes (Signed)
Assisted [place] C, RN with placing a temp foley in patient

## 2015-11-26 NOTE — ED Notes (Signed)
Critical Care at the bedside  

## 2015-11-26 NOTE — Progress Notes (Signed)
[place] Progress Note Patient Name: [name] DOB: [birth year] MRN: [id number]   Date of Service  12/12/2015  HPI/Events of Note  Request for palliative care consult.   eICU Interventions  Will order a palliative care consult.     Intervention Category Minor Interventions: Communication with other healthcare providers and/or family;Routine modifications to care plan (e.g. PRN medications for pain, fever)  Morris Markham Cornelia Copa 11/25/2015, 8:15 PM

## 2015-11-27 DIAGNOSIS — G935 Compression of brain: Secondary | ICD-10-CM

## 2015-11-27 DIAGNOSIS — G936 Cerebral edema: Secondary | ICD-10-CM

## 2015-11-27 DIAGNOSIS — Z7189 Other specified counseling: Secondary | ICD-10-CM

## 2015-11-27 DIAGNOSIS — I611 Nontraumatic intracerebral hemorrhage in hemisphere, cortical: Principal | ICD-10-CM

## 2015-11-27 DIAGNOSIS — R4020 Unspecified coma: Secondary | ICD-10-CM

## 2015-11-27 DIAGNOSIS — R0600 Dyspnea, unspecified: Secondary | ICD-10-CM | POA: Insufficient documentation

## 2015-11-27 DIAGNOSIS — Z515 Encounter for palliative care: Secondary | ICD-10-CM

## 2015-11-27 MED ORDER — SCOPOLAMINE 1 MG/3DAYS TD PT72
1.0000 | MEDICATED_PATCH | TRANSDERMAL | Status: DC
  Administered 2015-11-27: 1.5 mg via TRANSDERMAL
  Filled 2015-11-27: qty 1

## 2015-11-27 NOTE — Progress Notes (Signed)
Nutrition Brief Note  Chart reviewed. Pt now transitioning to comfort care.  No further nutrition interventions warranted at this time.  Please re-consult as needed.   Mialynn Shelvin M. Tavonte Seybold, MS, RD LDN After Hours/Weekend Pager [phone]    

## 2015-11-27 NOTE — Progress Notes (Signed)
STROKE TEAM PROGRESS NOTE   HISTORY OF PRESENT ILLNESS [name] is an 82 y.o. male with known cerebral amyloid and history of ICH. Presented to hospital today due to change in mental status. On arrival he was found to have large left ICH with IC component. Patient was having difficulty with air way and after talking with son the decision to intubate was made. He was last known well 11/25/2015, at 7:30 AM. Patient was not administered IV t-PA secondary to [place]. He was admitted to the neuro ICU for further evaluation and treatment. NIHSS 24.   SUBJECTIVE (INTERVAL HISTORY) His family is at the bedside. Patient had a previous history of stroke several years ago and had seen me. Diagnosis of possible amyloid angiopathy was entertained.Blood pressure has not been elevated since admission.CT scan shows a massive left  brain parenchymal intracerebral hemorrhage with significant cytotoxic edema and midline shift Patient already on morphine drip. Dr. Leonie Man discussed diagnoses and prognosis with family.   OBJECTIVE Temp:  [95.5 F (35.3 C)-99.9 F (37.7 C)] 99.9 F (37.7 C) (05/04 0900) Pulse Rate:  [44-59] 59 (05/04 0900) Cardiac Rhythm:  [-] Sinus bradycardia (05/04 0800) Resp:  [7-20] 10 (05/04 0900) BP: (89-154)/(45-82) 92/45 mmHg (05/04 0900) SpO2:  [72 %-100 %] 77 % (05/04 0900)  CBC:  Recent Labs Lab 12/19/2015 0924 11/25/2015 0930  WBC 5.9  --   NEUTROABS 3.8  --   HGB 11.8* 13.6  HCT 36.6* 40.0  MCV 85.7  --   PLT 131*  --     Basic Metabolic Panel:  Recent Labs Lab 11/30/2015 0924 11/25/2015 0930  NA 140 141  K 4.4 4.3  CL 107 105  CO2 22  --   GLUCOSE 202* 196*  BUN 21* 23*  CREATININE 1.01 0.80  CALCIUM 8.6*  --     Lipid Panel:    Component Value Date/Time   CHOL 164 04/17/2011 0500   TRIG 87 12/02/2015 0924   HDL 45 04/17/2011 0500   CHOLHDL 3.6 04/17/2011 0500   VLDL 27 04/17/2011 0500   LDLCALC 92 04/17/2011 0500   HgbA1c:  Lab Results  Component Value  Date   HGBA1C 6.1* 04/17/2011   Urine Drug Screen:    Component Value Date/Time   LABOPIA NONE DETECTED 12/22/2015 1022   COCAINSCRNUR NONE DETECTED 12/13/2015 1022   LABBENZ NONE DETECTED 12/20/2015 1022   AMPHETMU NONE DETECTED 12/24/2015 1022   THCU NONE DETECTED 12/16/2015 1022   LABBARB NONE DETECTED 12/06/2015 1022      IMAGING  Ct Head Wo Contrast  12/07/2015  CLINICAL DATA:  Code stroke. Facial droop and altered mental status. EXAM: CT HEAD WITHOUT CONTRAST TECHNIQUE: Contiguous axial images were obtained from the base of the skull through the vertex without intravenous contrast. COMPARISON:  07/09/2015 FINDINGS: There is a very large parenchymal hemorrhage involving the left frontal and parietal lobes measuring approximately 11.1 x 4.6 cm. There is intraventricular extension at the level of the posterior body/atrium of the left lateral ventricle with blood filling the atrium and occipital horn. A small amount of hemorrhage is present in the atrium/occipital horn on the right. There is left cerebral hemispheric edema surrounding the hemorrhage with associated sulcal effacement and 5 mm of rightward midline shift. A small amount of subarachnoid hemorrhage is present in several sulci at the level of the sylvian fissure on the left as well as at the anterior aspect of the left frontal lobe extending into the interhemispheric fissure. Chronic right  occipital and left parietal cortical infarcts are noted. Chronic small vessel ischemic disease is noted in the cerebral white matter. Patchy hypoattenuation throughout the deep gray nuclei bilaterally may reflect chronic small vessel ischemic changes, though superimposed acute ischemia is not excluded superiorly on the left. Prior bilateral cataract extraction is noted. The visualized paranasal sinuses and mastoid air cells are clear. Calcified atherosclerosis is noted at the skullbase. Prior endovascular treatment of a right. Ophthalmic aneurysm is  again seen with evidence of a stent and close levels. IMPRESSION: 1. 11 cm left frontoparietal parenchymal hemorrhage with intraventricular extension. 2. Small volume subarachnoid hemorrhage. 3. Chronic right occipital and left parietal cortical infarcts. Critical Value/emergent results were called by telephone at the time of interpretation on 11/25/2015 at 9:37 am to Dr. Cristobal Goldmann, who verbally acknowledged these results. Electronically Signed   By: Logan Bores M.D.   On: 12/20/2015 09:46   Dg Chest Portable 1 View  11/25/2015  CLINICAL DATA:  Endotracheal tube placement EXAM: PORTABLE CHEST 1 VIEW COMPARISON:  CT chest 10/31/2015 FINDINGS: Endotracheal tube with the tip 3.3 cm above the carina. Nasogastric tube coursing below the diaphragm. There is mild bilateral chronic interstitial thickening. There is no focal parenchymal opacity. There is no pleural effusion or pneumothorax. The heart and mediastinal contours are unremarkable. The osseous structures are unremarkable. IMPRESSION: Endotracheal tube with the tip 3.3 cm above the carina. Nasogastric tube coursing below the diaphragm. Electronically Signed   By: Kathreen Devoid   On: 11/25/2015 10:27       PHYSICAL EXAM Frail Frail elderly Caucasian male who is sedated on morphine drip . Marland Kitchen Afebrile. Head is nontraumatic. Neck is supple without bruit.    Cardiac exam no murmur or gallop. Lungs are clear to auscultation. Distal pulses are well felt. Neurological exam : Sedated. Eyes are closed. Comatose and unresponsive. Pupils 2 mm sluggishly reactive. Left gaze preference. Fundi are not visualized. Does not blink to threat on either side. No response to facial grimacing. Sternal rub leads to mild response on the left side and none on the right. Left plantar upgoing right downgoing. ASSESSMENT/PLAN [name] is a 82 y.o. male with history of known cerebral amyloid and history of ICH presenting with right-sided weakness and aphasia. CT showed a large L  frontoparietal IPH with IVH & SAH.   Stroke: Dominant large left frontoparietal intraparenchymal hemorrhage with IVH and SAH secondary to cerebral amyloid angiopathy source  Resultant  Comatose state  CT large left frontoparietal parenchymal hemorrhage with intraventricular extension. Small subarachnoid hemorrhage. Old right occipital and left parietal cortical infarcts.  UDS neg  SCDs for VTE prophylaxis  Diet NPO time specified  No antithrombotic prior to admission  Family have opted for palliative care  On morphine drip  Transfer to floor  Social work consult for residential hospice - pt/famiy lives in [place]  Disposition:  pending   Acute respiratory failure  Secondary to stroke  Intubated in the emergency department  Hyperglycemia  Other Stroke Risk Factors  Hx hypertension  Advanced age  Former Cigarette smoker  ETOH use  Hx stroke/TIA  2017 R temporal hemorrhage  2012 L Parietal Hemorrhage with resultant R hemiparesis  Carotid stenosis status post stent  Other Active Problems  history right paraophthalmic aneurysm coiling 2010  Hx BPPV  Hospital day # [place] for Pager information 11/27/2015 10:59 AM  I have personally examined this patient, reviewed notes, independently viewed imaging  studies, participated in medical decision making and plan of care. I have made any additions or clarifications directly to the above note. Agree with note above. The patient has unfortunately had a massive left brain intracerebral hemorrhage with significant cytotoxic edema and  Midline shift and herniation. His neurological exam is quite poor and prognosis for survival is bleak. And a long discussion of the bedside with the patient's wife and son and answered questions. They're quite clear that patient would not have wanted to live a life of major disability or have aggressive measures done. I agree with  comfort care measures only. This patient is critically ill and at significant risk of neurological worsening, death and care requires constant monitoring of vital signs, hemodynamics,respiratory and cardiac monitoring, extensive review of multiple databases, frequent neurological assessment, discussion with family, other specialists and medical decision making of high complexity.I have made any additions or clarifications directly to the above note.This critical care time does not reflect procedure time, or teaching time or supervisory time of PA/NP/Med Resident etc but could involve care discussion time.  I spent 40 minutes of neurocritical care time  in the care of  this patient.      Antony Contras, MD Medical Director [facility] Pager: [phone] 11/27/2015 2:48 PM    To contact Stroke Continuity provider, please refer to [email or web address] After hours, contact General Neurology

## 2015-11-27 NOTE — Progress Notes (Signed)
Report called to [facility] nurse. No further questions. Family aware of move and will accompany patient to his new room.

## 2015-11-27 NOTE — Consult Note (Signed)
Consultation Note Date: 11/27/2015   Patient Name: [name]  DOB: 05/15/1930  MRN: [id number]  Age / Sex: 82 y.o., male  PCP: Zachary Rim, MD Referring Physician: Garvin Fila, MD  Reason for Consultation: Terminal Care  HPI/Patient Profile: 82 y.o. male  with past medical history of COPD, ICH, recent PNA, recent aspiration events, some baseline underlying dementia  admitted on 12/11/2015 with massive ICH .   Life limiting illness:  Massive ICH  Clinical Assessment and Goals of Care:  82 yr old gentleman, lives at home with son, recently D/C from rehab after hospitalization for PNA, history of recent aspiration events, history of some baseline dementia, angiopathy cerebral. Prior ICH in past with deficit. Was in normal state health. Had sudden chang ein MS. To ER,. CT with massive ICH, extension. There were discussions with ETT ? Futility but decided for ett. CCM was called for vent management and critical care needs. Family arriving. NO asp, no vomit, some HTn on prop. Patient was diagnosed with Massive ICH, H/o Cerebral Amyloid,Acute Resp Failure, type 4 After extensive discussions with family, by CCM, neuro, family decided for comfort care only. patient was extubated. He is in neuro ICU on Morphine drip. Appreciate chaplain follow up. Palliative care consulted for additional supportive measures at end of life.   Patient is resting in bed, he is unresponsive but appears comfortable. Son [name] and grand daughter are present at the bedside. Brief life review performed, patient with history of previous ICH, he underwent rehab and was semi independently living at home with son. Wife deceased from dementia, patient was her primary caregiver. Patient lives in [place], [place] with son.   Reviewed patient's acute critical non survivable condition of brain bleed with the patient's son. He is with anticipatory  grief over the imminent demise of his father. End of life signs and symptoms discussed extensively, all questions answered.   Thank you for the consult.     HCPOA  son [name].   SUMMARY OF RECOMMENDATIONS   DNR DNI, continue comfort measures  Prognosis hours to days discussed with son holding vigil at the bedside Supportive care for patient and family as a unit Add scopolamine patch for secretions management, also has Robinul IV PRN Continue Morphine infusion, titrate for comfort.   Code Status/Advance Care Planning:  DNR    Symptom Management:    continue Morphine infusion. Add Scopolamine patch for secretions. Discussed with son about noisy breathing being part of the dying process.   Palliative Prophylaxis:   Delirium Protocol  Additional Recommendations (Limitations, Scope, Preferences):  Full Comfort Care  Psycho-social/Spiritual:   Desire for further Chaplaincy support:yes  Additional Recommendations: Grief/Bereavement Support  Prognosis:   Hours - Days  Discharge Planning: Anticipated Hospital Death, may consider transfer to 6N today if patient comfortable on the current morphine drip. Family is appreciative of the care being provided currently        Primary Diagnoses: Present on Admission:  . ICH (intracerebral hemorrhage) ([place])  I have  reviewed the medical record, interviewed the patient and family, and examined the patient. The following aspects are pertinent.  Past Medical History  Diagnosis Date  . COPD (chronic obstructive pulmonary disease) ([place])   . GERD (gastroesophageal reflux disease)   . Anxiety and depression   . Gout   . CVA (cerebral infarction) 2012    Left parietal ICH; "left him weaker on the right side" (03/13/2013)  . GIB (gastrointestinal bleeding) 04/12  . Diverticulosis   . Family history of anesthesia complication     "makes son nauseous" (03/13/2013)  . Hypertension   . Pneumonia     "several times; last time 12-18  months ago"  (03/13/2013)  . Exertional shortness of breath   . Arthritis     "neck; hands" (03/13/2013)  . Prostate cancer ([place])   . Stroke [facility])    Social History   Social History  . Marital Status: Widowed    Spouse Name: N/A  . Number of Children: N/A  . Years of Education: N/A   Social History Main Topics  . Smoking status: Former Smoker -- 1.50 packs/day for 54 years    Types: Cigarettes    Quit date: 07/26/1993  . Smokeless tobacco: Never Used  . Alcohol Use: Yes  . Drug Use: No  . Sexual Activity: No   Other Topics Concern  . None   Social History Narrative   Family History  Problem Relation Age of Onset  . Cancer Father   . Diabetes Sister   . Heart disease Sister   . Hyperlipidemia Sister   . Hypertension Sister    Scheduled Meds: .  stroke: mapping our early stages of recovery book   Does not apply Once  . pantoprazole (PROTONIX) IV  40 mg Intravenous QHS  . scopolamine  1 patch Transdermal Q72H  . senna-docusate  1 tablet Oral BID   Continuous Infusions: . sodium chloride    . morphine 4 mg/hr (12/05/2015 1200)  . niCARDipine     PRN Meds:.acetaminophen **OR** acetaminophen, antiseptic oral rinse, etomidate, fentaNYL (SUBLIMAZE) injection, fentaNYL (SUBLIMAZE) injection, glycopyrrolate **OR** glycopyrrolate **OR** glycopyrrolate, haloperidol **OR** haloperidol **OR** haloperidol lactate, labetalol, midazolam, ondansetron **OR** ondansetron (ZOFRAN) IV, polyvinyl alcohol Medications Prior to Admission:  Prior to Admission medications   Medication Sig Start Date End Date Taking? Authorizing Provider  albuterol (PROVENTIL HFA;VENTOLIN HFA) 108 (90 BASE) MCG/ACT inhaler Inhale 2 puffs into the lungs as needed for wheezing.    Historical Provider, MD  budesonide-formoterol (SYMBICORT) 80-4.5 MCG/ACT inhaler Inhale 2 puffs into the lungs 2 (two) times daily.    Historical Provider, MD  cefpodoxime (VANTIN) 200 MG tablet Take 1 tablet (200 mg total) by mouth  every 12 (twelve) hours. 11/05/15   Velvet Bathe, MD  Cholecalciferol (VITAMIN D PO) Take 1,000 mg by mouth daily.     Historical Provider, MD  finasteride (PROSCAR) 5 MG tablet Take 5 mg by mouth daily.    Historical Provider, MD  mirtazapine (REMERON) 30 MG tablet Take 30 mg by mouth at bedtime.    Historical Provider, MD  omeprazole (PRILOSEC) 20 MG capsule Take 20 mg by mouth daily.      Historical Provider, MD  sertraline (ZOLOFT) 100 MG tablet Take 100 mg by mouth 2 (two) times daily. 10/21/15   Historical Provider, MD   Allergies  Allergen Reactions  . Contrast Media [Iodinated Diagnostic Agents] Hives, Shortness Of Breath, Swelling and Rash   Review of Systems Non verbal Physical Exam Non verbal Comfortable Gurgling  respirations noisy breathing No coolness no mottling Regular Abdomen soft Unresponsive  Vital Signs: BP 95/58 mmHg  Pulse 57  Temp(Src) 99.5 F (37.5 C) (Core (Comment))  Resp 7  Wt 68.6 kg (151 lb 3.8 oz)  SpO2 76% Pain Assessment: CPOT       SpO2: SpO2: (!) 76 % O2 Device:SpO2: (!) 76 % O2 Flow Rate: .   IO: Intake/output summary:  Intake/Output Summary (Last 24 hours) at 11/27/15 0854 Last data filed at 11/27/15 0600  Gross per 24 hour  Intake  81.18 ml  Output   1050 ml  Net -968.82 ml    LBM:   Baseline Weight: Weight: 68.6 kg (151 lb 3.8 oz) Most recent weight: Weight: 68.6 kg (151 lb 3.8 oz)     Palliative Assessment/Data:   Flowsheet Rows        Most Recent Value   Intake Tab    Referral Department  Critical care   Unit at Time of Referral  ICU   Palliative Care Primary Diagnosis  Neurology   Palliative Care Type  New Palliative care   Reason for referral  End of Life Care Assistance   Date first seen by Palliative Care  11/27/15   Clinical Assessment    Palliative Performance Scale Score  10%   Pain Max last 24 hours  4   Pain Min Last 24 hours  3   Dyspnea Max Last 24 Hours  3   Dyspnea Min Last 24 hours  2   Psychosocial  & Spiritual Assessment    Palliative Care Outcomes    Patient/Family meeting held?  Yes   Who was at the meeting?  son grand daughter    Palliative Care follow-up planned  No      Time In: 8 Time Out: 9 Time Total: 60 Greater than 50%  of this time was spent counseling and coordinating care related to the above assessment and plan.  Signed by: Loistine Chance, MD  [id number] Please contact Palliative Medicine Team phone at [phone] for questions and concerns.  For individual provider: See Shea Evans

## 2015-11-27 NOTE — Progress Notes (Signed)
[facility] PCCM  Chart reviewed Full comfort measures  PCCM to sign off  Roselie Awkward, MD [place] [facility] Pager: [phone] Cell: [phone] After 3pm or if no response, call [phone]

## 2015-11-28 DIAGNOSIS — G935 Compression of brain: Secondary | ICD-10-CM

## 2015-11-29 NOTE — Discharge Summary (Signed)
Patient ID: REA PARCHEM [id number] 82 y.o. [birth year]  Admit date: 12/17/2015  Admission Diagnoses:  Large acute intracerebral hemorrhage  Date of death: 12-26-2015  Cause of Death :  Intracerebral hemorrhage Cerebral edema  Hospital Course : Patient was initially intubated and placed on mechanical ventilation as well as admitted to the neuro intensive care unit. Because of his severe hemorrhage and very poor prognosis, patient's family agreed to comfort measures only. Patient was extubated and was subsequently transferred to a regular hospital room. He was given morphine drip IV for comfort. He expired at 11:15 PM on Dec 26, 2015.  Rush Farmer M.D. [facility] [phone]  11/29/2015  6:39 AM

## 2015-12-25 NOTE — Progress Notes (Signed)
Pt. Family member refused the bath stated that they did not know if he was going to make it and they wanted to let him rest

## 2015-12-25 NOTE — Clinical Social Work Note (Signed)
CSW received consult for patient's family wanting [facility] residential hospice.  CSW made referral to Hospice facility [phone] and faxed requested clinicals.  [facility] to review clinicals and determine if patient eligible for hospice placement.  CSW awaiting review from [place], if approved hospice facility can accept patient over the weekend.  CSW to continue to follow patient's progress.  Jones Broom. [place], MSW, [place] 12/06/2015 6:42 PM

## 2015-12-25 NOTE — Progress Notes (Signed)
STROKE TEAM PROGRESS NOTE   HISTORY OF PRESENT ILLNESS [name] is an 82 y.o. male with known cerebral amyloid and history of ICH. Presented to hospital today due to change in mental status. On arrival he was found to have large left ICH with IC component. Patient was having difficulty with air way and after talking with son the decision to intubate was made. He was last known well 11/25/2015, at 7:30 AM. Patient was not administered IV t-PA secondary to [place]. He was admitted to the neuro ICU for further evaluation and treatment. NIHSS 24.   SUBJECTIVE (INTERVAL HISTORY) His son is at the bedside. Patient  Is resting comfortably on morphine drip. Family prefers to move into hospice home in [place] close to where they live.   OBJECTIVE Temp:  [100.8 F (38.2 C)-101.4 F (38.6 C)] 100.8 F (38.2 C) (05/05 0608) Pulse Rate:  [71-78] 78 (05/05 0608) Cardiac Rhythm:  [-]  Resp:  [12-19] 19 (05/05 0608) BP: (111-118)/(50-51) 111/50 mmHg (05/05 0608) SpO2:  [77 %-78 %] 78 % (05/05 0608)  CBC:   Recent Labs Lab 11/27/2015 0924 12/02/2015 0930  WBC 5.9  --   NEUTROABS 3.8  --   HGB 11.8* 13.6  HCT 36.6* 40.0  MCV 85.7  --   PLT 131*  --     Basic Metabolic Panel:   Recent Labs Lab 12/16/2015 0924 12/05/2015 0930  NA 140 141  K 4.4 4.3  CL 107 105  CO2 22  --   GLUCOSE 202* 196*  BUN 21* 23*  CREATININE 1.01 0.80  CALCIUM 8.6*  --     Lipid Panel:     Component Value Date/Time   CHOL 164 04/17/2011 0500   TRIG 87 11/25/2015 0924   HDL 45 04/17/2011 0500   CHOLHDL 3.6 04/17/2011 0500   VLDL 27 04/17/2011 0500   LDLCALC 92 04/17/2011 0500   HgbA1c:  Lab Results  Component Value Date   HGBA1C 6.1* 04/17/2011   Urine Drug Screen:     Component Value Date/Time   LABOPIA NONE DETECTED 12/20/2015 1022   COCAINSCRNUR NONE DETECTED 12/18/2015 1022   LABBENZ NONE DETECTED 12/06/2015 1022   AMPHETMU NONE DETECTED 11/25/2015 1022   THCU NONE DETECTED 12/08/2015 1022   LABBARB NONE DETECTED 12/24/2015 1022      IMAGING  No results found.     PHYSICAL EXAM Frail Frail elderly Caucasian male who is sedated on morphine drip . Marland Kitchen Afebrile. Head is nontraumatic. Neck is supple without bruit.    Cardiac exam no murmur or gallop. Lungs are clear to auscultation. Distal pulses are well felt. Neurological exam : Sedated. Eyes are closed. Comatose and unresponsive. Pupils 2 mm sluggishly reactive. Left gaze preference. Fundi are not visualized. Does not blink to threat on either side. No response to facial grimacing. Sternal rub leads to mild response on the left side and none on the right. Left plantar upgoing right downgoing. ASSESSMENT/PLAN [name] is a 82 y.o. male with history of known cerebral amyloid and history of ICH presenting with right-sided weakness and aphasia. CT showed a large L frontoparietal IPH with IVH & SAH.   Stroke: Dominant large left frontoparietal intraparenchymal hemorrhage with IVH and SAH secondary to  likely cerebral amyloid angiopathy    Resultant  Comatose state  CT large left frontoparietal parenchymal hemorrhage with intraventricular extension. Small subarachnoid hemorrhage. Old right occipital and left parietal cortical infarcts.  UDS neg  SCDs for VTE prophylaxis  Diet NPO time specified  No antithrombotic prior to admission  Family have opted for palliative care  On morphine drip  Transfer to floor  Social work consult for residential hospice - pt/famiy lives in [place]  Disposition:  pending   Acute respiratory failure  Secondary to stroke  Intubated in the emergency department  Hyperglycemia  Other Stroke Risk Factors  Hx hypertension  Advanced age  Former Cigarette smoker  ETOH use  Hx stroke/TIA  2017 R temporal hemorrhage  2012 L Parietal Hemorrhage with resultant R hemiparesis  Carotid stenosis status post stent  Other Active Problems  history right  paraophthalmic aneurysm coiling 2010  Hx BPPV  Hospital day # 2     The patient has unfortunately had a massive left brain intracerebral hemorrhage with significant cytotoxic edema and  Midline shift and herniation. His neurological exam is quite poor and prognosis for survival is bleak. And a long discussion of the bedside with the patient's wife and son and answered questions. They're quite clear that patient would not have wanted to live a life of major disability or have aggressive measures done. I agree with comfort care measures only. Continue morphine drip. Social work consult for residential hospice placement in [place] upon family request     Antony Contras, MD Medical Director [place] Pager: [phone] 12/13/15 4:42 PM    To contact Stroke Continuity provider, please refer to [email or web address] After hours, contact General Neurology

## 2015-12-25 DEATH — deceased
